# Patient Record
Sex: Female | Born: 1950 | Race: White | Hispanic: No | Marital: Single | Smoking: Never smoker
Health system: Southern US, Community
[De-identification: ages and names within clinical notes are randomized; demographics above are authoritative.]

## PROBLEM LIST (undated history)

## (undated) DIAGNOSIS — J45909 Unspecified asthma, uncomplicated: Secondary | ICD-10-CM

## (undated) DIAGNOSIS — I4891 Unspecified atrial fibrillation: Secondary | ICD-10-CM

## (undated) DIAGNOSIS — J189 Pneumonia, unspecified organism: Secondary | ICD-10-CM

## (undated) DIAGNOSIS — D649 Anemia, unspecified: Secondary | ICD-10-CM

## (undated) DIAGNOSIS — G4733 Obstructive sleep apnea (adult) (pediatric): Secondary | ICD-10-CM

## (undated) DIAGNOSIS — M1990 Unspecified osteoarthritis, unspecified site: Secondary | ICD-10-CM

## (undated) DIAGNOSIS — I10 Essential (primary) hypertension: Secondary | ICD-10-CM

## (undated) HISTORY — PX: TUBAL LIGATION: SHX77

## (undated) HISTORY — PX: OTHER SURGICAL HISTORY: SHX169

## (undated) HISTORY — PX: COLONOSCOPY: SHX174

## (undated) HISTORY — DX: Obstructive sleep apnea (adult) (pediatric): G47.33

---

## 1999-01-19 ENCOUNTER — Other Ambulatory Visit: Admission: RE | Admit: 1999-01-19 | Discharge: 1999-01-19 | Payer: Self-pay | Admitting: Obstetrics and Gynecology

## 2002-04-17 ENCOUNTER — Other Ambulatory Visit: Admission: RE | Admit: 2002-04-17 | Discharge: 2002-04-17 | Payer: Self-pay | Admitting: Obstetrics and Gynecology

## 2009-02-28 ENCOUNTER — Ambulatory Visit (HOSPITAL_COMMUNITY): Admission: RE | Admit: 2009-02-28 | Discharge: 2009-02-28 | Payer: Self-pay | Admitting: Family Medicine

## 2016-02-11 ENCOUNTER — Other Ambulatory Visit: Payer: Self-pay | Admitting: Internal Medicine

## 2016-02-11 ENCOUNTER — Emergency Department (HOSPITAL_COMMUNITY)
Admission: EM | Admit: 2016-02-11 | Discharge: 2016-02-11 | Disposition: A | Discharge status: Home / Self Care | Payer: BLUE CROSS/BLUE SHIELD | Attending: Internal Medicine | Admitting: Internal Medicine

## 2016-02-11 ENCOUNTER — Encounter (HOSPITAL_COMMUNITY): Payer: Self-pay | Admitting: Emergency Medicine

## 2016-02-11 ENCOUNTER — Emergency Department (HOSPITAL_COMMUNITY): Payer: BLUE CROSS/BLUE SHIELD

## 2016-02-11 DIAGNOSIS — I4891 Unspecified atrial fibrillation: Secondary | ICD-10-CM | POA: Diagnosis not present

## 2016-02-11 DIAGNOSIS — J45909 Unspecified asthma, uncomplicated: Secondary | ICD-10-CM | POA: Diagnosis not present

## 2016-02-11 DIAGNOSIS — Z7982 Long term (current) use of aspirin: Secondary | ICD-10-CM | POA: Diagnosis not present

## 2016-02-11 DIAGNOSIS — I480 Paroxysmal atrial fibrillation: Secondary | ICD-10-CM | POA: Diagnosis not present

## 2016-02-11 DIAGNOSIS — I10 Essential (primary) hypertension: Secondary | ICD-10-CM | POA: Insufficient documentation

## 2016-02-11 DIAGNOSIS — Z79899 Other long term (current) drug therapy: Secondary | ICD-10-CM | POA: Diagnosis not present

## 2016-02-11 DIAGNOSIS — R002 Palpitations: Secondary | ICD-10-CM | POA: Diagnosis present

## 2016-02-11 HISTORY — DX: Unspecified asthma, uncomplicated: J45.909

## 2016-02-11 HISTORY — DX: Essential (primary) hypertension: I10

## 2016-02-11 LAB — CBC WITH DIFFERENTIAL/PLATELET
BASOS ABS: 0 10*3/uL (ref 0.0–0.1)
BASOS PCT: 0 %
EOS ABS: 0.1 10*3/uL (ref 0.0–0.7)
EOS PCT: 2 %
HCT: 39.2 % (ref 36.0–46.0)
Hemoglobin: 13 g/dL (ref 12.0–15.0)
LYMPHS PCT: 33 %
Lymphs Abs: 1.7 10*3/uL (ref 0.7–4.0)
MCH: 32.6 pg (ref 26.0–34.0)
MCHC: 33.2 g/dL (ref 30.0–36.0)
MCV: 98.2 fL (ref 78.0–100.0)
MONO ABS: 0.5 10*3/uL (ref 0.1–1.0)
Monocytes Relative: 10 %
Neutro Abs: 2.8 10*3/uL (ref 1.7–7.7)
Neutrophils Relative %: 55 %
PLATELETS: 158 10*3/uL (ref 150–400)
RBC: 3.99 MIL/uL (ref 3.87–5.11)
RDW: 12.6 % (ref 11.5–15.5)
WBC: 5.1 10*3/uL (ref 4.0–10.5)

## 2016-02-11 LAB — BASIC METABOLIC PANEL
ANION GAP: 9 (ref 5–15)
BUN: 16 mg/dL (ref 6–20)
CALCIUM: 8.8 mg/dL — AB (ref 8.9–10.3)
CO2: 23 mmol/L (ref 22–32)
Chloride: 110 mmol/L (ref 101–111)
Creatinine, Ser: 0.8 mg/dL (ref 0.44–1.00)
GFR calc Af Amer: >60 mL/min (ref >60)
GLUCOSE: 130 mg/dL — AB (ref 65–99)
Potassium: 3.8 mmol/L (ref 3.5–5.1)
SODIUM: 142 mmol/L (ref 135–145)

## 2016-02-11 LAB — TSH: TSH: 1.728 u[IU]/mL (ref 0.350–4.500)

## 2016-02-11 LAB — TROPONIN I

## 2016-02-11 MED ORDER — APIXABAN 5 MG PO TABS
5.0000 mg | ORAL_TABLET | Freq: Two times a day (BID) | ORAL | Status: DC
Start: 2016-02-11 — End: 2016-03-05

## 2016-02-11 MED ORDER — HEPARIN (PORCINE) IN NACL 100-0.45 UNIT/ML-% IJ SOLN
900.0000 [IU]/h | INTRAMUSCULAR | Status: DC
  Administered 2016-02-11: 900 [IU]/h via INTRAVENOUS
  Filled 2016-02-11: qty 250

## 2016-02-11 MED ORDER — DILTIAZEM HCL 100 MG IV SOLR: 5.0000 mg/h | INTRAVENOUS | Status: DC

## 2016-02-11 MED ORDER — DILTIAZEM LOAD VIA INFUSION
10.0000 mg | Freq: Once | INTRAVENOUS | Status: DC
  Filled 2016-02-11: qty 10

## 2016-02-11 MED ORDER — DILTIAZEM HCL ER COATED BEADS 120 MG PO CP24: 120.0000 mg | ORAL_CAPSULE | Freq: Every day | ORAL | Status: DC

## 2016-02-11 MED ORDER — HEPARIN BOLUS VIA INFUSION
3500.0000 [IU] | Freq: Once | INTRAVENOUS | Status: AC
  Administered 2016-02-11: 3500 [IU] via INTRAVENOUS
  Filled 2016-02-11: qty 3500

## 2016-02-11 NOTE — ED Provider Notes (Addendum)
CSN: [id number]     Arrival date & time 02/11/16  0424 History   None    Chief Complaint  Patient presents with  . Atrial Fibrillation     (Consider location/radiation/quality/duration/timing/severity/associated sxs/prior Treatment) HPI Comments: Patient brought to the emergency department for evaluation of heart palpitations. Patient reports that she awakened around 3:30 AM to go to the bathroom and noticed that she was having a slight aching in her left shoulder area and then noticed that her heart was pounding. She took an aspirin and it did not improve. She called EMS who found her to be in atrial fibrillation with rapid ventricular response as fast as 180 bpm. She was given IV Cardizem 20 mg IV during transport. Patient reports symptoms are improved at arrival.  Patient is a 65 y.o. female presenting with atrial fibrillation.  Atrial Fibrillation    Past Medical History  Diagnosis Date  . Hypertension   . Asthma    History reviewed. No pertinent past surgical history. No family history on file. Social History  Substance Use Topics  . Smoking status: Never Smoker   . Smokeless tobacco: None  . Alcohol Use: None   OB History    No data available     Review of Systems  Cardiovascular: Positive for palpitations.  All other systems reviewed and are negative.     Allergies  Review of patient's allergies indicates no known allergies.  Home Medications   Prior to Admission medications   Medication Sig Start Date End Date Taking? Authorizing Provider  albuterol (PROVENTIL HFA;VENTOLIN HFA) 108 (90 Base) MCG/ACT inhaler Inhale into the lungs every 6 (six) hours as needed for wheezing or shortness of breath.   Yes Historical Provider, MD  aspirin EC 81 MG tablet Take 81 mg by mouth daily.   Yes Historical Provider, MD  B Complex-C (B-COMPLEX WITH VITAMIN C) tablet Take 1 tablet by mouth daily.   Yes Historical Provider, MD  busPIRone (BUSPAR) 5 MG tablet Take 5 mg by  mouth daily.   Yes Historical Provider, MD  cholecalciferol (VITAMIN D) 1000 units tablet Take 2,000 Units by mouth daily.   Yes Historical Provider, MD  losartan-hydrochlorothiazide (HYZAAR) 100-25 MG tablet Take 1 tablet by mouth daily.   Yes Historical Provider, MD  Multiple Vitamin (MULTIVITAMIN WITH MINERALS) TABS tablet Take 1 tablet by mouth daily.   Yes Historical Provider, MD  verapamil (CALAN) 120 MG tablet Take 240 mg by mouth daily.   Yes Historical Provider, MD   BP 128/88 mmHg  Pulse 65  Temp(Src) 98.6 F (37 C) (Oral)  Resp 19  Ht 5\' 4"  (1.626 m)  Wt 195 lb (88.451 kg)  BMI 33.46 kg/m2  SpO2 98% Physical Exam  Constitutional: She is oriented to person, place, and time. She appears well-developed and well-nourished. No distress.  HENT:  Head: Normocephalic and atraumatic.  Right Ear: Hearing normal.  Left Ear: Hearing normal.  Nose: Nose normal.  Mouth/Throat: Oropharynx is clear and moist and mucous membranes are normal.  Eyes: Conjunctivae and EOM are normal. Pupils are equal, round, and reactive to light.  Neck: Normal range of motion. Neck supple.  Cardiovascular: Regular rhythm, S1 normal and S2 normal.  Exam reveals no gallop and no friction rub.   No murmur heard. Pulmonary/Chest: Effort normal and breath sounds normal. No respiratory distress. She exhibits no tenderness.  Abdominal: Soft. Normal appearance and bowel sounds are normal. There is no hepatosplenomegaly. There is no tenderness. There is no rebound, no  guarding, no tenderness at McBurney's point and negative Murphy's sign. No hernia.  Musculoskeletal: Normal range of motion.  Neurological: She is alert and oriented to person, place, and time. She has normal strength. No cranial nerve deficit or sensory deficit. Coordination normal. GCS eye subscore is 4. GCS verbal subscore is 5. GCS motor subscore is 6.  Skin: Skin is warm, dry and intact. No rash noted. No cyanosis.  Psychiatric: She has a normal  mood and affect. Her speech is normal and behavior is normal. Thought content normal.  Nursing note and vitals reviewed.   ED Course  Procedures (including critical care time) Labs Review Labs Reviewed  BASIC METABOLIC PANEL - Abnormal; Notable for the following:    Glucose, Bld 130 (*)    Calcium 8.8 (*)    All other components within normal limits  CBC WITH DIFFERENTIAL/PLATELET  TROPONIN I  TSH  HEPARIN LEVEL (UNFRACTIONATED)    Imaging Review Dg Chest 2 View  02/11/2016  CLINICAL DATA:  Chest pain, tachycardia and palpitations, onset this morning. EXAM: CHEST  2 VIEW COMPARISON:  None. FINDINGS: There is mild cardiomegaly. Mild linear basilar opacity on the left, likely scarring or minimal atelectasis. No confluent alveolar opacities. No effusions. Normal pulmonary vasculature. Hilar and mediastinal contours are unremarkable. No prior chest imaging studies are available. IMPRESSION: Mild cardiomegaly. Mild linear scarring or atelectasis in the left base. Electronically Signed   By: Ellery Plunk M.D.   On: 02/11/2016 06:37   I have personally reviewed and evaluated these images and lab results as part of my medical decision-making.        EKG Interpretation   Date/Time:  Saturday February 11 2016 06:42:41 EDT Ventricular Rate:  67 PR Interval:  170 QRS Duration: 89 QT Interval:  408 QTC Calculation: 431 R Axis:   -25 Text Interpretation:  Sinus rhythm Borderline left axis deviation Low  voltage, extremity leads Otherwise within normal limits Confirmed by  Meyer Dockery  MD, Royelle Hinchman ([id number]) on 02/11/2016 8:04:42 AM      MDM   Final diagnoses:  Atrial fibrillation with RVR (HCC)    Patient awakened with discomfort in her chest and heart palpitations around 2:00 this morning. EMS report finding her in atrial fibrillation with rapid ventricular response as high as 180 bpm. She was administered a Cardizem bolus and converted to sinus rhythm. At arrival to the ER she  was not on a monitor. She did sound to be regular upon initial evaluation, but after being placed in a room and on a continuous monitor, she was found to be back in atrial fibrillation with rapid ventricular response. She is not experiencing any chest pain or shortness of breath with this. Arrangements were being made to start the patient on heparin and IV Cardizem, but she converted to sinus rhythm once again. Patient to be discussed with cardiology. Signed out to oncoming ER physician to disposition patient.   Gilda Crease, MD 02/11/16 6045  Gilda Crease, MD 02/12/16 [id number]

## 2016-02-11 NOTE — ED Provider Notes (Signed)
Cardiology consult obtained. Cardiologist recommended diltiazem 120 mg LA daily along with Eliquis 5 mg twice a day. Discontinue verapamil. Continue to take losartan/HCTZ.   Patient will follow-up with cardiologist. Patient was discharged in normal sinus rhythm.  [name] HutchingBrian Laurier Jasperson, MD 02/11/16 1023

## 2016-02-11 NOTE — Consult Note (Signed)
CONSULTATION NOTE  Reason for Consult: Atrial fibrillation  Requesting Physician: Dr. Betsey Holiday  Cardiologist: None (NEW)  HPI: This is a 65 y.o. female with a past medical history significant for hypertension and asthma. She presented with onset of aching in her left shoulder and heart racing. She took an aspirin without any improvement and called EMS. On arrival she was found to be in A. fib with rapid ventricular response at 180 bpm. She was given IV diltiazem and spontaneously converted to sinus rhythm however had a short recurrence of A. fib in the ER and then again converted back to sinus rhythm. This patients CHA2DS2-VASc Score and unadjusted Ischemic Stroke Rate (% per year) is equal to 2.2 % stroke rate/year from a score of 2  Above score calculated as 1 point each if present [CHF, HTN, DM, Vascular=MI/PAD/Aortic Plaque, Age if 65-74, or Female] Above score calculated as 2 points each if present [Age > 75, or Stroke/TIA/TE]  Cardiology is asked to recommend treatment and follow-up options.  PMHx:  Past Medical History  Diagnosis Date  . Hypertension   . Asthma    History reviewed. No pertinent past surgical history.  FAMHx: Family History  Problem Relation Age of Onset  . Atrial fibrillation Mother   . Hypertension Mother   . Coronary artery disease Father     SOCHx:  reports that she has never smoked. She does not have any smokeless tobacco history on file. Her alcohol and drug histories are not on file.  ALLERGIES: No Known Allergies  ROS: Pertinent items noted in HPI and remainder of comprehensive ROS otherwise negative.  HOME MEDICATIONS:   Medication List    ASK your doctor about these medications        albuterol 108 (90 Base) MCG/ACT inhaler  Commonly known as:  PROVENTIL HFA;VENTOLIN HFA  Inhale into the lungs every 6 (six) hours as needed for wheezing or shortness of breath.     aspirin EC 81 MG tablet  Take 81 mg by mouth daily.     B-complex with vitamin C tablet  Take 1 tablet by mouth daily.     busPIRone 5 MG tablet  Commonly known as:  BUSPAR  Take 5 mg by mouth daily.     cholecalciferol 1000 units tablet  Commonly known as:  VITAMIN D  Take 2,000 Units by mouth daily.     losartan-hydrochlorothiazide 100-25 MG tablet  Commonly known as:  HYZAAR  Take 1 tablet by mouth daily.     multivitamin with minerals Tabs tablet  Take 1 tablet by mouth daily.     verapamil 120 MG tablet  Commonly known as:  CALAN  Take 240 mg by mouth daily.        HOSPITAL MEDICATIONS: I have reviewed the patient's current medications.  VITALS: Blood pressure 128/88, pulse 65, temperature 98.6 F (37 C), temperature source Oral, resp. rate 19, height 5' 4" (1.626 m), weight 195 lb (88.451 kg), SpO2 98 %.  PHYSICAL EXAM: General appearance: alert and no distress Neck: no carotid bruit and no JVD Lungs: clear to auscultation bilaterally Heart: regular rate and rhythm, S1, S2 normal, no murmur, click, rub or gallop Abdomen: soft, non-tender; bowel sounds normal; no masses,  no organomegaly Extremities: extremities normal, atraumatic, no cyanosis or edema Pulses: 2+ and symmetric Skin: Skin color, texture, turgor normal. No rashes or lesions Neurologic: Grossly normal Psych: Pleasant  LABS: Results for orders placed or performed during the hospital encounter of 02/11/16 (from  the past 48 hour(s))  CBC with Differential/Platelet     Status: None   Collection Time: 02/11/16  5:17 AM  Result Value Ref Range   WBC 5.1 4.0 - 10.5 K/uL   RBC 3.99 3.87 - 5.11 MIL/uL   Hemoglobin 13.0 12.0 - 15.0 g/dL   HCT 39.2 36.0 - 46.0 %   MCV 98.2 78.0 - 100.0 fL   MCH 32.6 26.0 - 34.0 pg   MCHC 33.2 30.0 - 36.0 g/dL   RDW 12.6 11.5 - 15.5 %   Platelets 158 150 - 400 K/uL   Neutrophils Relative % 55 %   Neutro Abs 2.8 1.7 - 7.7 K/uL   Lymphocytes Relative 33 %   Lymphs Abs 1.7 0.7 - 4.0 K/uL   Monocytes Relative 10 %    Monocytes Absolute 0.5 0.1 - 1.0 K/uL   Eosinophils Relative 2 %   Eosinophils Absolute 0.1 0.0 - 0.7 K/uL   Basophils Relative 0 %   Basophils Absolute 0.0 0.0 - 0.1 K/uL  Basic metabolic panel     Status: Abnormal   Collection Time: 02/11/16  5:17 AM  Result Value Ref Range   Sodium 142 135 - 145 mmol/L   Potassium 3.8 3.5 - 5.1 mmol/L   Chloride 110 101 - 111 mmol/L   CO2 23 22 - 32 mmol/L   Glucose, Bld 130 (H) 65 - 99 mg/dL   BUN 16 6 - 20 mg/dL   Creatinine, Ser 0.80 0.44 - 1.00 mg/dL   Calcium 8.8 (L) 8.9 - 10.3 mg/dL   GFR calc non Af Amer >60 >60 mL/min   GFR calc Af Amer >60 >60 mL/min    Comment: (NOTE) The eGFR has been calculated using the CKD EPI equation. This calculation has not been validated in all clinical situations. eGFR's persistently <60 mL/min signify possible Chronic Kidney Disease.    Anion gap 9 5 - 15  Troponin I     Status: None   Collection Time: 02/11/16  5:17 AM  Result Value Ref Range   Troponin I <0.03 <0.031 ng/mL    Comment:        NO INDICATION OF MYOCARDIAL INJURY.   TSH     Status: None   Collection Time: 02/11/16  5:17 AM  Result Value Ref Range   TSH 1.728 0.350 - 4.500 uIU/mL    IMAGING: Dg Chest 2 View  02/11/2016  CLINICAL DATA:  Chest pain, tachycardia and palpitations, onset this morning. EXAM: CHEST  2 VIEW COMPARISON:  None. FINDINGS: There is mild cardiomegaly. Mild linear basilar opacity on the left, likely scarring or minimal atelectasis. No confluent alveolar opacities. No effusions. Normal pulmonary vasculature. Hilar and mediastinal contours are unremarkable. No prior chest imaging studies are available. IMPRESSION: Mild cardiomegaly. Mild linear scarring or atelectasis in the left base. Electronically Signed   By: Andreas Newport M.D.   On: 02/11/2016 06:37    HOSPITAL DIAGNOSES: Principal Problem:   PAF (paroxysmal atrial fibrillation) (HCC) Active Problems:   Essential hypertension    Asthma   IMPRESSION: 1. Paroxysmal atrial fibrillation, symptomatic 2. Essential hypertension  RECOMMENDATION: 1. [name] [name] had new onset paroxysmal atrial fibrillation with rapid ventricular response. This was improved with diltiazem. She converted spontaneously back to sinus rhythm. She's currently asymptomatic. She does have a history of hypertension. Her CHADSVASC score is 2. I'm recommending Eliquis 5 mg twice a day. We will discontinue verapamil and start Cardizem LA 120 mg daily. She should continue losartan for  hypertension. Aspirin is not necessary. Our office will contact her for follow-up appointment and we will schedule outpatient stress testing and echocardiogram prior to that visit. She can be safely discharged home today.  Thanks for the consultation.  Time Spent Directly with Patient: 45 minutes  Pixie Casino, MD, [facility] Attending Cardiologist [place] 02/11/2016, 9:47 AM

## 2016-02-11 NOTE — ED Notes (Signed)
[facility] EMS reports: patient awakened approx 0300 with palpitations and rapid heartrate. Called EMS. Patient in AF with RVR. No prior history. 20 gauge placed in R hand. Cardizem 20 mg administered. Converted to NSR at 0340. Denies pain. BP 142/90, Pulse 94.

## 2016-02-11 NOTE — Discharge Instructions (Signed)
Cardiologist recommends starting 2 new medications. Prescriptions given. Discontinue verapamil. Continue to take your Losartan/HCTZ.   Follow-up with cardiologist next week. They will call you Monday for an appointment.

## 2016-02-11 NOTE — Progress Notes (Addendum)
ANTICOAGULATION CONSULT NOTE - Initial Consult  Pharmacy Consult for Heparin  Indication: atrial fibrillation  No Known Allergies  Patient Measurements: Height: 5\' 4"  (162.6 cm) Weight: 195 lb (88.451 kg) IBW/kg (Calculated) : 54.7  Vital Signs: Temp: 98.6 F (37 C) (03/18 0532) Temp Source: Oral (03/18 0532) BP: 120/97 mmHg (03/18 0532) Pulse Rate: 141 (03/18 0532)  Labs: In process   Medical History: Past Medical History  Diagnosis Date  . Hypertension   . Asthma    Assessment: 65 y/o F with new onset afib, no anti-coagulation PTA, labs in process, starting heparin per pharmacy  Goal of Therapy:  Heparin level 0.3-0.7 units/ml Monitor platelets by anticoagulation protocol: Yes   Plan:  -Heparin 3500 units BOLUS -Start heparin drip at 900 units/hr -1400 HL -Daily CBC/HL -Monitor for bleeding -F/U in process labs ASAP  Abran DukeLedford, Jaber Dunlow 02/11/2016,5:52 AM  ================== Addendum 6:07 AM CBC good Wilmer FloorJLedford, PharmD

## 2016-02-21 ENCOUNTER — Telehealth (HOSPITAL_COMMUNITY): Payer: Self-pay

## 2016-02-21 NOTE — Telephone Encounter (Signed)
Encounter complete. 

## 2016-02-22 ENCOUNTER — Ambulatory Visit (HOSPITAL_BASED_OUTPATIENT_CLINIC_OR_DEPARTMENT_OTHER)
Admission: RE | Admit: 2016-02-22 | Discharge: 2016-02-22 | Disposition: A | Discharge status: Home / Self Care | Payer: BLUE CROSS/BLUE SHIELD | Source: Ambulatory Visit | Attending: Cardiovascular Disease | Admitting: Cardiovascular Disease

## 2016-02-22 ENCOUNTER — Ambulatory Visit (HOSPITAL_COMMUNITY)
Admission: RE | Admit: 2016-02-22 | Discharge: 2016-02-22 | Disposition: A | Discharge status: Home / Self Care | Payer: BLUE CROSS/BLUE SHIELD | Source: Ambulatory Visit | Attending: Cardiovascular Disease | Admitting: Cardiovascular Disease

## 2016-02-22 DIAGNOSIS — I351 Nonrheumatic aortic (valve) insufficiency: Secondary | ICD-10-CM | POA: Diagnosis not present

## 2016-02-22 DIAGNOSIS — I119 Hypertensive heart disease without heart failure: Secondary | ICD-10-CM | POA: Diagnosis not present

## 2016-02-22 DIAGNOSIS — I4891 Unspecified atrial fibrillation: Secondary | ICD-10-CM | POA: Diagnosis present

## 2016-02-22 DIAGNOSIS — I480 Paroxysmal atrial fibrillation: Secondary | ICD-10-CM

## 2016-02-22 DIAGNOSIS — Z8249 Family history of ischemic heart disease and other diseases of the circulatory system: Secondary | ICD-10-CM | POA: Diagnosis not present

## 2016-02-22 DIAGNOSIS — I059 Rheumatic mitral valve disease, unspecified: Secondary | ICD-10-CM | POA: Insufficient documentation

## 2016-02-22 LAB — EXERCISE TOLERANCE TEST
CHL RATE OF PERCEIVED EXERTION: 17
CSEPED: 4 min
CSEPEDS: 26 s
CSEPEW: 6.3 METS
MPHR: 157 {beats}/min
Peak HR: 137 {beats}/min
Percent HR: 87 %
Rest HR: 64 {beats}/min

## 2016-03-05 ENCOUNTER — Encounter: Payer: Self-pay | Admitting: Internal Medicine

## 2016-03-05 ENCOUNTER — Ambulatory Visit (INDEPENDENT_AMBULATORY_CARE_PROVIDER_SITE_OTHER): Payer: BLUE CROSS/BLUE SHIELD | Admitting: Internal Medicine

## 2016-03-05 VITALS — BP 142/64 | HR 64 | Ht 64.0 in | Wt 197.0 lb

## 2016-03-05 DIAGNOSIS — I480 Paroxysmal atrial fibrillation: Secondary | ICD-10-CM

## 2016-03-05 DIAGNOSIS — Z7901 Long term (current) use of anticoagulants: Secondary | ICD-10-CM

## 2016-03-05 DIAGNOSIS — I10 Essential (primary) hypertension: Secondary | ICD-10-CM

## 2016-03-05 MED ORDER — APIXABAN 5 MG PO TABS: 5.0000 mg | ORAL_TABLET | Freq: Two times a day (BID) | ORAL | Status: DC

## 2016-03-05 MED ORDER — DILTIAZEM HCL ER COATED BEADS 120 MG PO CP24: 120.0000 mg | ORAL_CAPSULE | Freq: Every day | ORAL | Status: DC

## 2016-03-05 NOTE — Patient Instructions (Signed)
Your physician wants you to follow-up in: 6 months with Dr. Hilty. You will receive a reminder letter in the mail two months in advance. If you don't receive a letter, please call our office to schedule the follow-up appointment.    

## 2016-03-05 NOTE — Progress Notes (Signed)
OFFICE NOTE  Chief Complaint:  Hospital follow-up, follow-up studies, no complaints  Primary Care Physician: [name] CourierGerald K Hill, MD  HPI:  [name] [name] is a 65 y.o. female with a past medical history significant for hypertension and asthma. She presented with onset of aching in her left shoulder and heart racing. She took an aspirin without any improvement and called EMS. On arrival she was found to be in A. fib with rapid ventricular response at 180 bpm. She was given IV diltiazem and spontaneously converted to sinus rhythm however had a short recurrence of A. fib in the ER and then again converted back to sinus rhythm. This patients CHA2DS2-VASc Score and unadjusted Ischemic Stroke Rate (% per year) is equal to 2.2 % stroke rate/year from a score of 2. Above score calculated as 1 point each if present [CHF, HTN, DM, Vascular=MI/PAD/Aortic Plaque, Age if 65-74, or Female] Above score calculated as 2 points each if present [Age > 75, or Stroke/TIA/TE]  [name] [name] returns today for follow-up. She underwent an exercise treadmill stress test which was negative for ischemia. Although the report indicated she did not reach target, I personally reviewed it and indicated her heart rate was greater than 85% max predicted heart rate. Therefore this was an adequate study and low risk. Her echocardiogram demonstrates normal LV function with an EF of 60-65%, there is trivial aortic insufficiency and it was read as grade 2 diastolic dysfunction. Is not clear whether she may eventually been in A. fib or not had recovery of atrial mechanical activity at the time therefore I question the degree of diastolic dysfunction. She denies any chest pain, or shortness of breath with exertion. Blood pressure is fairly well-controlled today. She is tolerating diltiazem which was a switch from verapamil which she was previously taking. She is also on Eliquis 5 mg twice a day without any bleeding problems. Heart rate is in the 60s today  and regular.  PMHx:  Past Medical History  Diagnosis Date  . Hypertension   . Asthma     History reviewed. No pertinent past surgical history.  FAMHx:  Family History  Problem Relation Age of Onset  . Atrial fibrillation Mother   . Hypertension Mother   . Coronary artery disease Father     SOCHx:   reports that she has never smoked. She does not have any smokeless tobacco history on file. She reports that she does not drink alcohol or use illicit drugs.  ALLERGIES:  No Known Allergies  ROS: Pertinent items noted in HPI and remainder of comprehensive ROS otherwise negative.  HOME MEDS: Current Outpatient Prescriptions  Medication Sig Dispense Refill  . albuterol (PROVENTIL HFA;VENTOLIN HFA) 108 (90 Base) MCG/ACT inhaler Inhale into the lungs every 6 (six) hours as needed for wheezing or shortness of breath.    Marland Kitchen. apixaban (ELIQUIS) 5 MG TABS tablet Take 1 tablet (5 mg total) by mouth 2 (two) times daily. 60 tablet 5  . B Complex-C (B-COMPLEX WITH VITAMIN C) tablet Take 1 tablet by mouth daily.    . busPIRone (BUSPAR) 5 MG tablet Take 5 mg by mouth daily.    . cholecalciferol (VITAMIN D) 1000 units tablet Take 2,000 Units by mouth daily.    Marland Kitchen. diltiazem (CARDIZEM CD) 120 MG 24 hr capsule Take 1 capsule (120 mg total) by mouth daily. 30 capsule 5  . losartan-hydrochlorothiazide (HYZAAR) 100-25 MG tablet Take 1 tablet by mouth daily.    . Multiple Vitamin (MULTIVITAMIN WITH MINERALS) TABS  tablet Take 1 tablet by mouth daily.     No current facility-administered medications for this visit.    LABS/IMAGING: No results found for this or any previous visit (from the past 48 hour(s)). No results found.  WEIGHTS: Wt Readings from Last 3 Encounters:  03/05/16 197 lb (89.359 kg)  02/11/16 195 lb (88.451 kg)    VITALS: BP 142/64 mmHg  Pulse 64  Ht  (1.626 m)  Wt 197 lb (89.359 kg)  BMI 33.80 kg/m2  EXAM: General appearance: alert and no distress Neck: no carotid  bruit and no JVD Lungs: clear to auscultation bilaterally Heart: regular rate and rhythm, S1, S2 normal, no murmur, click, rub or gallop Abdomen: soft, non-tender; bowel sounds normal; no masses,  no organomegaly Extremities: extremities normal, atraumatic, no cyanosis or edema Pulses: 2+ and symmetric Skin: Skin color, texture, turgor normal. No rashes or lesions Neurologic: Grossly normal Psych: Pleasant  EKG: Deferred  ASSESSMENT: 1. PAF-CHADSVASC score of 2 on Eliquis 2. Hypertension-controlled 3. Long-term use of anticoagulants  PLAN: 1.   [name] had a low risk exercise treadmill stress test and normal LV function on echo with questionable grade 2 diastolic dysfunction and trivial AI. Symptomatically she is doing very well. She denies any chest pain or shortness of breath with exertion. She says she walks regularly and will start to increase her exercise again. She seems to be tolerating Eliquis without bleeding problems. The switch of her diltiazem for verapamil has maintained blood pressure. Rate control is appropriate. We'll plan to continue her current medicines and see her back in 6 months.  [name] Nose, MD, [facility] Attending Cardiologist [facility]  [name] [name] 03/05/2016, 10:11 AM

## 2016-08-05 ENCOUNTER — Encounter (HOSPITAL_COMMUNITY): Payer: Self-pay

## 2016-08-05 ENCOUNTER — Emergency Department (HOSPITAL_COMMUNITY): Payer: BLUE CROSS/BLUE SHIELD

## 2016-08-05 ENCOUNTER — Emergency Department (HOSPITAL_COMMUNITY)
Admission: EM | Admit: 2016-08-05 | Discharge: 2016-08-05 | Disposition: A | Discharge status: Home / Self Care | Payer: BLUE CROSS/BLUE SHIELD | Attending: Emergency Medicine | Admitting: Emergency Medicine

## 2016-08-05 DIAGNOSIS — Y999 Unspecified external cause status: Secondary | ICD-10-CM | POA: Diagnosis not present

## 2016-08-05 DIAGNOSIS — S82892A Other fracture of left lower leg, initial encounter for closed fracture: Secondary | ICD-10-CM

## 2016-08-05 DIAGNOSIS — Y929 Unspecified place or not applicable: Secondary | ICD-10-CM | POA: Insufficient documentation

## 2016-08-05 DIAGNOSIS — S82852A Displaced trimalleolar fracture of left lower leg, initial encounter for closed fracture: Secondary | ICD-10-CM | POA: Insufficient documentation

## 2016-08-05 DIAGNOSIS — X501XXA Overexertion from prolonged static or awkward postures, initial encounter: Secondary | ICD-10-CM | POA: Insufficient documentation

## 2016-08-05 DIAGNOSIS — Y9301 Activity, walking, marching and hiking: Secondary | ICD-10-CM | POA: Diagnosis not present

## 2016-08-05 DIAGNOSIS — I10 Essential (primary) hypertension: Secondary | ICD-10-CM | POA: Diagnosis not present

## 2016-08-05 DIAGNOSIS — Z79899 Other long term (current) drug therapy: Secondary | ICD-10-CM | POA: Insufficient documentation

## 2016-08-05 DIAGNOSIS — S99912A Unspecified injury of left ankle, initial encounter: Secondary | ICD-10-CM | POA: Diagnosis present

## 2016-08-05 DIAGNOSIS — J45909 Unspecified asthma, uncomplicated: Secondary | ICD-10-CM | POA: Insufficient documentation

## 2016-08-05 HISTORY — DX: Unspecified atrial fibrillation: I48.91

## 2016-08-05 MED ORDER — OXYCODONE-ACETAMINOPHEN 5-325 MG PO TABS: 2.0000 | ORAL_TABLET | ORAL | 0 refills | Status: DC | PRN

## 2016-08-05 MED ORDER — HYDROCODONE-ACETAMINOPHEN 5-325 MG PO TABS
1.0000 | ORAL_TABLET | Freq: Once | ORAL | Status: AC
  Administered 2016-08-05: 1 via ORAL
  Filled 2016-08-05: qty 1

## 2016-08-05 MED ORDER — OXYCODONE-ACETAMINOPHEN 5-325 MG PO TABS: 1.0000 | ORAL_TABLET | ORAL | 0 refills | Status: DC | PRN

## 2016-08-05 MED ORDER — HYDROCODONE-ACETAMINOPHEN 5-325 MG PO TABS: 1.0000 | ORAL_TABLET | ORAL | 0 refills | Status: DC | PRN

## 2016-08-05 NOTE — ED Triage Notes (Signed)
Patient brought in by [facility] . PT complains of left ankle pain. States she was walking and twisted ankle. Swelling noted to left ankle with skin abrasion to left knee. No obvious deformity noted. C/m/s intact.

## 2016-08-05 NOTE — Discharge Instructions (Signed)
Elevate and use ice as much as possible for the next several days to help with swelling and pain.  Do not weight bear - use your crutches at all times!  You may take the hydrocodone prescribed for pain relief.  This will make you drowsy - do not drive within 4 hours of taking this medication.

## 2016-08-05 NOTE — ED Notes (Addendum)
Percocet 6 pack to go given to patient at discharge with verbal instructions. Witnessed by Sharia ReeveLeigh Wallace RN.

## 2016-08-05 NOTE — ED Provider Notes (Signed)
[facility]Y DEPT Provider Note   CSN: [id number] Arrival date & time: 08/05/16  1649  By signing my name below, I, Christy Sartorius, attest that this documentation has been prepared under the direction and in the presence of  Burgess Amor, PA-C. Electronically Signed: Christy Sartorius, ED Scribe. 08/05/16. 6:06 PM.  History   Chief Complaint Chief Complaint  Patient presents with  . Ankle Injury   The history is provided by the patient and medical records. No language interpreter was used.   HPI Comments:  [name] [name] is a 65 y.o. female currently taking Eliquis brought in by ambulance, who presents to the Emergency Department s/p fall just PTA complaining of pain and swelling in her left ankle.  She reports she was walking when her right foot got caught in a hole and she fell, twisting the ankle.  No alleviating factors noted.  She denies numbness, weakness and pain in her hip or knee.  She also denies known medication allergies.  She drives a forklift for a living.  Her PCP is Dr. Mirna Mires.    Past Medical History:  Diagnosis Date  . A-fib (HCC)   . Asthma   . Hypertension     Patient Active Problem List   Diagnosis Date Noted  . Long term current use of anticoagulant 03/05/2016  . PAF (paroxysmal atrial fibrillation) (HCC) 02/11/2016  . Essential hypertension 02/11/2016  . Asthma 02/11/2016    Past Surgical History:  Procedure Laterality Date  . c section      OB History    No data available       Home Medications    Prior to Admission medications   Medication Sig Start Date End Date Taking? Authorizing Provider  apixaban (ELIQUIS) 5 MG TABS tablet Take 1 tablet (5 mg total) by mouth 2 (two) times daily. 03/05/16  Yes Chrystie Nose, MD  B Complex-C (B-COMPLEX WITH VITAMIN C) tablet Take 1 tablet by mouth daily.   Yes Historical Provider, MD  busPIRone (BUSPAR) 5 MG tablet Take 5 mg by mouth 2 (two) times daily.    Yes Historical Provider, MD    cholecalciferol (VITAMIN D) 1000 units tablet Take 2,000 Units by mouth daily.   Yes Historical Provider, MD  diltiazem (CARDIZEM CD) 120 MG 24 hr capsule Take 1 capsule (120 mg total) by mouth daily. 03/05/16  Yes Chrystie Nose, MD  losartan-hydrochlorothiazide (HYZAAR) 100-25 MG tablet Take 1 tablet by mouth daily.   Yes Historical Provider, MD  Multiple Vitamin (MULTIVITAMIN WITH MINERALS) TABS tablet Take 1 tablet by mouth daily.   Yes Historical Provider, MD  albuterol (PROVENTIL HFA;VENTOLIN HFA) 108 (90 Base) MCG/ACT inhaler Inhale into the lungs every 6 (six) hours as needed for wheezing or shortness of breath.    Historical Provider, MD  oxyCODONE-acetaminophen (PERCOCET/ROXICET) 5-325 MG tablet Take 1 tablet by mouth every 4 (four) hours as needed. 08/05/16   Burgess Amor, PA-C  oxyCODONE-acetaminophen (PERCOCET/ROXICET) 5-325 MG tablet Take 2 tablets by mouth every 4 (four) hours as needed for severe pain. 08/05/16   Burgess Amor, PA-C    Family History Family History  Problem Relation Age of Onset  . Atrial fibrillation Mother   . Hypertension Mother   . Coronary artery disease Father     Social History Social History  Substance Use Topics  . Smoking status: Never Smoker  . Smokeless tobacco: Never Used  . Alcohol use No     Allergies   Review of patient's allergies  indicates no known allergies.   Review of Systems Review of Systems  Musculoskeletal: Positive for arthralgias, joint swelling and myalgias.  Neurological: Negative for weakness and numbness.     Physical Exam Updated Vital Signs BP 127/81 (BP Location: Right Arm)   Pulse 61   Temp 98.2 F (36.8 C) (Oral)   Resp 15   Ht 5\' 4"  (1.626 m)   Wt 88.5 kg   SpO2 95%   BMI 33.47 kg/m   Physical Exam  Constitutional: She is oriented to person, place, and time. She appears well-developed and well-nourished. No distress.  HENT:  Head: Normocephalic and atraumatic.  Eyes: Conjunctivae are normal.   Cardiovascular: Normal rate.   Pulmonary/Chest: Effort normal.  Musculoskeletal:  Tender to palpation of bilateral left ankle malleoli.  Minimal edema and no bruising.  Distal sensation intact with full flexion and extention of toes without pain.  DP 2+ Proximal fibula non-tender, modest abrasion on left anterior knee.  Hip non-tender.   Neurological: She is alert and oriented to person, place, and time.  Skin: Skin is warm and dry.  Psychiatric: She has a normal mood and affect.  Nursing note and vitals reviewed.    ED Treatments / Results   DIAGNOSTIC STUDIES:  Oxygen Saturation is 96% on RA, NML by my interpretation.    COORDINATION OF CARE:  6:06 PM Discussed treatment plan with pt at bedside and pt agreed to plan.  Labs (all labs ordered are listed, but only abnormal results are displayed) Labs Reviewed - No data to display  EKG  EKG Interpretation None       Radiology Dg Ankle Complete Left  Result Date: 08/05/2016 CLINICAL DATA:  Left ankle pain after fall EXAM: LEFT ANKLE COMPLETE - 3+ VIEW COMPARISON:  None. FINDINGS: There is a transverse medial malleolus fracture, nondisplaced. There is an oblique left distal fibular metaphysis fracture with 4 mm lateral displacement of the distal fracture fragment. There is a nondisplaced posterior malleolus fracture in the left distal tibia. No left ankle subluxation. No suspicious focal osseous lesion. Diffuse left ankle soft tissue swelling. Small Achilles and plantar left calcaneal spurs. IMPRESSION: Trimalleolar left ankle fractures as described.  No subluxation. Electronically Signed   By: Delbert Phenix M.D.   On: 08/05/2016 17:33   Dg Knee Complete 4 Views Left  Result Date: 08/05/2016 CLINICAL DATA:  Fall.  Left knee pain. EXAM: LEFT KNEE - COMPLETE 4+ VIEW COMPARISON:  None. FINDINGS: No evidence of fracture, dislocation, or joint effusion. No evidence of arthropathy or other focal bone abnormality. Soft tissues are  unremarkable. IMPRESSION: Negative. Electronically Signed   By: Delbert Phenix M.D.   On: 08/05/2016 17:31    Procedures Procedures (including critical care time)  Medications Ordered in ED Medications  HYDROcodone-acetaminophen (NORCO/VICODIN) 5-325 MG per tablet 1 tablet (1 tablet Oral Given 08/05/16 1826)     Initial Impression / Assessment and Plan / ED Course  I have reviewed the triage vital signs and the nursing notes.  Pertinent labs & imaging results that were available during my care of the patient were reviewed by me and considered in my medical decision making (see chart for details).  Clinical Course  Value Comment By Time  DG Knee Complete 4 Views Left (Reviewed) Christy Sartorius 09/10 1754    Discussed with Dr. Magnus Ivan who will see pt in office - pt to call for appt within the next week.  Posterior splint/stirrup.  Crutches.  RICE, oxycodone prescribed.   Patient  will be discharged home & is agreeable with above plan. Returns precautions discussed. Pt appears safe for discharge.  Final Clinical Impressions(s) / ED Diagnoses   Final diagnoses:  Closed left ankle fracture, initial encounter    New Prescriptions Discharge Medication List as of 08/05/2016  7:54 PM    START taking these medications   Details  !! oxyCODONE-acetaminophen (PERCOCET/ROXICET) 5-325 MG tablet Take 1 tablet by mouth every 4 (four) hours as needed., Starting Sun 08/05/2016, Print    !! oxyCODONE-acetaminophen (PERCOCET/ROXICET) 5-325 MG tablet Take 2 tablets by mouth every 4 (four) hours as needed for severe pain., Starting Sun 08/05/2016, Print     !! - Potential duplicate medications found. Please discuss with provider.     I personally performed the services described in this documentation, which was scribed in my presence. The recorded information has been reviewed and is accurate.     Burgess AmorJulie Geraldene Eisel, PA-C 08/07/16 1415    Vanetta MuldersScott Zackowski, MD 08/08/16 1651

## 2016-08-06 MED FILL — Oxycodone w/ Acetaminophen Tab 5-325 MG: ORAL | Qty: 6 | Status: AC

## 2016-08-09 ENCOUNTER — Other Ambulatory Visit: Payer: Self-pay | Admitting: Physician Assistant

## 2016-08-15 ENCOUNTER — Encounter (HOSPITAL_COMMUNITY): Payer: Self-pay | Admitting: *Deleted

## 2016-08-15 MED ORDER — CEFAZOLIN SODIUM-DEXTROSE 2-4 GM/100ML-% IV SOLN
2.0000 g | INTRAVENOUS | Status: AC
  Administered 2016-08-16: 2 g via INTRAVENOUS
  Filled 2016-08-15: qty 100

## 2016-08-15 NOTE — Progress Notes (Signed)
New onset of Atrial fib in April, 2017. Pt states that she has not been back A-fib since. Pt states she stopped her Eliquis on 08/12/16. She states Dr. Magnus IvanBlackman instructed her to stop Eliquis.

## 2016-08-15 NOTE — Progress Notes (Signed)
Anesthesia chart review: SAME DAY WORK-UP.  Patient is a 65 year old female scheduled for ORIF left bimalleolar ankle fracture on 08/16/2016 by Dr. Doneen Poissonhristopher Blackman.  History includes non-smoker, HTN, asthma, afib 01/2016, anemia. Last BMI (33) consistent with obesity.   PCP is listed as Dr. Mirna MiresGerald Hill. Cardiologist is Dr. Zoila ShutterKenneth Hilty. She is on Eliquis for afib (CHADSVASC of 2). Last visit 03/05/16 with six month follow-up planned.  Meds include albuterol, Eliquis, BuSpar, diltiazem, losartan-hydrochlorothiazide, Percocet. She reported being instructed to hold Eliquis for 3 days prior to surgery.   02/11/16 0531 EKG: afib with RVR at 146 bpm,l ow voltage, prolonged QT. 02/11/16 0642 EKG (after IV diltiazem): SR, borderline LAD, low voltage, extremity leads.   02/22/16 Echo: Study Conclusions - Left ventricle: The cavity size was normal. Wall thickness was   normal. Systolic function was normal. The estimated ejection   fraction was in the range of 60% to 65%. Wall motion was normal;   there were no regional wall motion abnormalities. Features are   consistent with a pseudonormal left ventricular filling pattern,   with concomitant abnormal relaxation and increased filling   pressure (grade 2 diastolic dysfunction). - Aortic valve: There was trivial regurgitation. Valve area (Vmax):   2.12 cm^2. - Mitral valve: Mildly to moderately calcified annulus. - Left atrium: The atrium was mildly dilated. Impressions: - The echo is technically difficult.  02/22/16 ETT:   There was no ST segment deviation noted during stress. Negative but non diagnostic exercise treadmill due to achievement of only 87% of maximum predicted HR. ([name] reviewed and wrote, "Although the report indicated she did not reach target, I personally reviewed it and indicated her heart rate was greater than 85% max predicted heart rate. Therefore this was an adequate study and low risk.")  02/11/16 CXR:  IMPRESSION: Mild cardiomegaly. Mild linear scarring or atelectasis in the left base.  Labs to be drawn on arrival.   If no acute changes and labs acceptable then I would anticipate that she could proceed as planned.  Velna Ochsllison Lakendria Nicastro, PA-C [facility]/Anesthesiology Phone [phone] 08/15/2016 11:56 AM

## 2016-08-15 NOTE — Anesthesia Preprocedure Evaluation (Addendum)
Anesthesia Evaluation  Patient identified by MRN, date of birth, ID band Patient awake    Reviewed: Allergy & Precautions, NPO status , Patient's Chart, lab work & pertinent test results  History of Anesthesia Complications Negative for: history of anesthetic complications  Airway Mallampati: III   Neck ROM: Full  Mouth opening: Limited Mouth Opening  Dental  (+) Teeth Intact, Dental Advisory Given   Pulmonary neg shortness of breath, asthma (last used 3 months ago) , neg sleep apnea, neg COPD, neg recent URI,    breath sounds clear to auscultation       Cardiovascular hypertension, (-) angina+CHF (grade II diastolic dysfunction)  (-) Past MI, (-) Cardiac Stents and (-) Orthopnea + dysrhythmias Atrial Fibrillation  Rhythm:Regular Rate:Normal  02/11/16 0531 EKG: afib with RVR at 146 bpm,l ow voltage, prolonged QT. 02/11/16 0642 EKG (after IV diltiazem): SR, borderline LAD, low voltage, extremity leads.   02/22/16 Echo: Study Conclusions - Left ventricle: The cavity size was normal. Wall thickness was normal. Systolic function was normal. The estimated ejection fraction was in the range of 60% to 65%. Wall motion was normal; there were no regional wall motion abnormalities. Features are consistent with a pseudonormal left ventricular filling pattern, with concomitant abnormal relaxation and increased filling pressure (grade 2 diastolic dysfunction). - Aortic valve: There was trivial regurgitation. Valve area (Vmax): 2.12 cm^2. - Mitral valve: Mildly to moderately calcified annulus. - Left atrium: The atrium was mildly dilated. Impressions: - The echo is technically difficult.  02/22/16 ETT:   There was no ST segment deviation noted during stress. Negative but non diagnostic exercise treadmill due to achievement of only 87% of maximum predicted HR. ([name] reviewed and wrote, "Although the report indicated she did  not reach target, I personally reviewed it and indicated her heart rate was greater than 85% max predicted heart rate. Therefore this was an adequate study and low risk.")   Neuro/Psych neg Seizures negative neurological ROS     GI/Hepatic negative GI ROS, Neg liver ROS,   Endo/Other  negative endocrine ROS  Renal/GU negative Renal ROS     Musculoskeletal  (+) Arthritis ,   Abdominal (+) + obese,   Peds  Hematology  (+) Blood dyscrasia, anemia ,   Anesthesia Other Findings   Reproductive/Obstetrics                            Anesthesia Physical Anesthesia Plan  ASA: III  Anesthesia Plan: General and Regional   Post-op Pain Management: GA combined w/ Regional for post-op pain   Induction: Intravenous  Airway Management Planned: LMA  Additional Equipment:   Intra-op Plan:   Post-operative Plan: Extubation in OR  Informed Consent: I have reviewed the patients History and Physical, chart, labs and discussed the procedure including the risks, benefits and alternatives for the proposed anesthesia with the patient or authorized representative who has indicated his/her understanding and acceptance.   Dental advisory given  Plan Discussed with: CRNA  Anesthesia Plan Comments: (Risks of general anesthesia discussed including, but not limited to, sore throat, hoarse voice, chipped/damaged teeth, injury to vocal cords, nausea and vomiting, allergic reactions, lung infection, heart attack, stroke, and death. All questions answered.  Discussed potential risks of nerve blocks including, but not limited to, infection, bleeding, nerve damage, seizures, pneumothorax, respiratory depression, and potential failure of the block. Alternatives to nerve blocks discussed. All questions answered. )      Anesthesia Quick Evaluation

## 2016-08-16 ENCOUNTER — Encounter (HOSPITAL_COMMUNITY): Payer: Self-pay | Admitting: *Deleted

## 2016-08-16 ENCOUNTER — Observation Stay (HOSPITAL_COMMUNITY)
Admission: RE | Admit: 2016-08-16 | Discharge: 2016-08-17 | Disposition: A | Discharge status: Home / Self Care | Payer: BLUE CROSS/BLUE SHIELD | Source: Ambulatory Visit | Attending: Orthopaedic Surgery | Admitting: Orthopaedic Surgery

## 2016-08-16 ENCOUNTER — Ambulatory Visit (HOSPITAL_COMMUNITY): Payer: BLUE CROSS/BLUE SHIELD | Admitting: Vascular Surgery

## 2016-08-16 ENCOUNTER — Encounter (HOSPITAL_COMMUNITY)
Admission: RE | Disposition: A | Discharge status: Home / Self Care | Payer: Self-pay | Source: Ambulatory Visit | Attending: Orthopaedic Surgery

## 2016-08-16 ENCOUNTER — Ambulatory Visit (HOSPITAL_COMMUNITY): Payer: BLUE CROSS/BLUE SHIELD

## 2016-08-16 DIAGNOSIS — W19XXXA Unspecified fall, initial encounter: Secondary | ICD-10-CM | POA: Insufficient documentation

## 2016-08-16 DIAGNOSIS — S82842A Displaced bimalleolar fracture of left lower leg, initial encounter for closed fracture: Secondary | ICD-10-CM

## 2016-08-16 DIAGNOSIS — I110 Hypertensive heart disease with heart failure: Secondary | ICD-10-CM | POA: Diagnosis not present

## 2016-08-16 DIAGNOSIS — J45909 Unspecified asthma, uncomplicated: Secondary | ICD-10-CM | POA: Diagnosis not present

## 2016-08-16 DIAGNOSIS — I5032 Chronic diastolic (congestive) heart failure: Secondary | ICD-10-CM | POA: Insufficient documentation

## 2016-08-16 DIAGNOSIS — M1990 Unspecified osteoarthritis, unspecified site: Secondary | ICD-10-CM | POA: Diagnosis not present

## 2016-08-16 DIAGNOSIS — I4891 Unspecified atrial fibrillation: Secondary | ICD-10-CM | POA: Diagnosis not present

## 2016-08-16 DIAGNOSIS — Z419 Encounter for procedure for purposes other than remedying health state, unspecified: Secondary | ICD-10-CM

## 2016-08-16 DIAGNOSIS — Z79899 Other long term (current) drug therapy: Secondary | ICD-10-CM | POA: Diagnosis not present

## 2016-08-16 DIAGNOSIS — S82852A Displaced trimalleolar fracture of left lower leg, initial encounter for closed fracture: Principal | ICD-10-CM | POA: Insufficient documentation

## 2016-08-16 DIAGNOSIS — Z7901 Long term (current) use of anticoagulants: Secondary | ICD-10-CM | POA: Insufficient documentation

## 2016-08-16 HISTORY — PX: ORIF ANKLE FRACTURE: SUR919

## 2016-08-16 HISTORY — DX: Unspecified osteoarthritis, unspecified site: M19.90

## 2016-08-16 HISTORY — PX: ORIF ANKLE FRACTURE: SHX5408

## 2016-08-16 HISTORY — DX: Pneumonia, unspecified organism: J18.9

## 2016-08-16 HISTORY — DX: Anemia, unspecified: D64.9

## 2016-08-16 LAB — CBC
HEMATOCRIT: 40.5 % (ref 36.0–46.0)
Hemoglobin: 13.5 g/dL (ref 12.0–15.0)
MCH: 32.9 pg (ref 26.0–34.0)
MCHC: 33.3 g/dL (ref 30.0–36.0)
MCV: 98.8 fL (ref 78.0–100.0)
Platelets: 202 10*3/uL (ref 150–400)
RBC: 4.1 MIL/uL (ref 3.87–5.11)
RDW: 12.2 % (ref 11.5–15.5)
WBC: 5.8 10*3/uL (ref 4.0–10.5)

## 2016-08-16 LAB — BASIC METABOLIC PANEL
Anion gap: 11 (ref 5–15)
BUN: 14 mg/dL (ref 6–20)
CHLORIDE: 104 mmol/L (ref 101–111)
CO2: 23 mmol/L (ref 22–32)
Calcium: 9.4 mg/dL (ref 8.9–10.3)
Creatinine, Ser: 0.88 mg/dL (ref 0.44–1.00)
GFR calc Af Amer: >60 mL/min (ref >60)
GFR calc non Af Amer: >60 mL/min (ref >60)
GLUCOSE: 116 mg/dL — AB (ref 65–99)
POTASSIUM: 3.8 mmol/L (ref 3.5–5.1)
Sodium: 138 mmol/L (ref 135–145)

## 2016-08-16 LAB — PROTIME-INR
INR: 1.04
Prothrombin Time: 13.6 seconds (ref 11.4–15.2)

## 2016-08-16 LAB — SURGICAL PCR SCREEN
MRSA, PCR: NEGATIVE
Staphylococcus aureus: NEGATIVE

## 2016-08-16 SURGERY — OPEN REDUCTION INTERNAL FIXATION (ORIF) ANKLE FRACTURE
Anesthesia: Regional | Site: Ankle | Laterality: Left

## 2016-08-16 MED ORDER — ONDANSETRON HCL 4 MG PO TABS: 4.0000 mg | ORAL_TABLET | Freq: Four times a day (QID) | ORAL | Status: DC | PRN

## 2016-08-16 MED ORDER — MIDAZOLAM HCL 5 MG/5ML IJ SOLN
INTRAMUSCULAR | Status: DC | PRN
Start: 2016-08-16 — End: 2016-08-16
  Administered 2016-08-16: 2 mg via INTRAVENOUS

## 2016-08-16 MED ORDER — LIDOCAINE 2% (20 MG/ML) 5 ML SYRINGE
INTRAMUSCULAR | Status: DC | PRN
  Administered 2016-08-16: 100 mg via INTRAVENOUS

## 2016-08-16 MED ORDER — PHENYLEPHRINE HCL 10 MG/ML IJ SOLN
INTRAMUSCULAR | Status: DC | PRN
  Administered 2016-08-16 (×2): 80 ug via INTRAVENOUS
  Administered 2016-08-16: 120 ug via INTRAVENOUS
  Administered 2016-08-16 (×2): 40 ug via INTRAVENOUS
  Administered 2016-08-16: 80 ug via INTRAVENOUS

## 2016-08-16 MED ORDER — ALBUTEROL SULFATE (2.5 MG/3ML) 0.083% IN NEBU: 3.0000 mL | INHALATION_SOLUTION | Freq: Four times a day (QID) | RESPIRATORY_TRACT | Status: DC | PRN

## 2016-08-16 MED ORDER — MIDAZOLAM HCL 2 MG/2ML IJ SOLN
INTRAMUSCULAR | Status: AC
  Filled 2016-08-16: qty 2

## 2016-08-16 MED ORDER — CEFAZOLIN IN D5W 1 GM/50ML IV SOLN
1.0000 g | Freq: Four times a day (QID) | INTRAVENOUS | Status: AC
  Administered 2016-08-16 – 2016-08-17 (×3): 1 g via INTRAVENOUS
  Filled 2016-08-16 (×3): qty 50

## 2016-08-16 MED ORDER — LIDOCAINE 2% (20 MG/ML) 5 ML SYRINGE
INTRAMUSCULAR | Status: AC
  Filled 2016-08-16: qty 5

## 2016-08-16 MED ORDER — DEXAMETHASONE SODIUM PHOSPHATE 4 MG/ML IJ SOLN
INTRAMUSCULAR | Status: DC | PRN
Start: 2016-08-16 — End: 2016-08-16
  Administered 2016-08-16: 10 mg via INTRAVENOUS

## 2016-08-16 MED ORDER — DEXMEDETOMIDINE HCL IN NACL 200 MCG/50ML IV SOLN
INTRAVENOUS | Status: AC
  Filled 2016-08-16: qty 50

## 2016-08-16 MED ORDER — ONDANSETRON HCL 4 MG/2ML IJ SOLN
4.0000 mg | Freq: Four times a day (QID) | INTRAMUSCULAR | Status: DC | PRN
Start: 2016-08-16 — End: 2016-08-17

## 2016-08-16 MED ORDER — LOSARTAN POTASSIUM 50 MG PO TABS
100.0000 mg | ORAL_TABLET | Freq: Every day | ORAL | Status: DC
  Administered 2016-08-16 – 2016-08-17 (×2): 100 mg via ORAL
  Filled 2016-08-16 (×2): qty 2

## 2016-08-16 MED ORDER — BUPIVACAINE-EPINEPHRINE (PF) 0.5% -1:200000 IJ SOLN
INTRAMUSCULAR | Status: DC | PRN
  Administered 2016-08-16: 20 mL via PERINEURAL
  Administered 2016-08-16: 30 mL via PERINEURAL

## 2016-08-16 MED ORDER — ONDANSETRON HCL 4 MG/2ML IJ SOLN
INTRAMUSCULAR | Status: AC
  Filled 2016-08-16: qty 2

## 2016-08-16 MED ORDER — ADULT MULTIVITAMIN W/MINERALS CH
1.0000 | ORAL_TABLET | Freq: Every day | ORAL | Status: DC
  Administered 2016-08-16 – 2016-08-17 (×2): 1 via ORAL
  Filled 2016-08-16 (×2): qty 1

## 2016-08-16 MED ORDER — DEXAMETHASONE SODIUM PHOSPHATE 10 MG/ML IJ SOLN
INTRAMUSCULAR | Status: AC
  Filled 2016-08-16: qty 1

## 2016-08-16 MED ORDER — ROCURONIUM BROMIDE 10 MG/ML (PF) SYRINGE
PREFILLED_SYRINGE | INTRAVENOUS | Status: AC
  Filled 2016-08-16: qty 10

## 2016-08-16 MED ORDER — MUPIROCIN 2 % EX OINT
1.0000 "application " | TOPICAL_OINTMENT | Freq: Once | CUTANEOUS | Status: AC
  Administered 2016-08-16: 1 via TOPICAL
  Filled 2016-08-16: qty 22

## 2016-08-16 MED ORDER — METOCLOPRAMIDE HCL 5 MG/ML IJ SOLN: 5.0000 mg | Freq: Three times a day (TID) | INTRAMUSCULAR | Status: DC | PRN

## 2016-08-16 MED ORDER — ACETAMINOPHEN 325 MG PO TABS: 650.0000 mg | ORAL_TABLET | Freq: Four times a day (QID) | ORAL | Status: DC | PRN

## 2016-08-16 MED ORDER — METOCLOPRAMIDE HCL 5 MG PO TABS: 5.0000 mg | ORAL_TABLET | Freq: Three times a day (TID) | ORAL | Status: DC | PRN

## 2016-08-16 MED ORDER — LOSARTAN POTASSIUM-HCTZ 100-25 MG PO TABS: 1.0000 | ORAL_TABLET | Freq: Every day | ORAL | Status: DC

## 2016-08-16 MED ORDER — FENTANYL CITRATE (PF) 100 MCG/2ML IJ SOLN: 25.0000 ug | INTRAMUSCULAR | Status: DC | PRN

## 2016-08-16 MED ORDER — OXYCODONE HCL 5 MG PO TABS
5.0000 mg | ORAL_TABLET | ORAL | Status: DC | PRN
  Administered 2016-08-17: 10 mg via ORAL
  Filled 2016-08-16 (×2): qty 2

## 2016-08-16 MED ORDER — HYDROMORPHONE HCL 1 MG/ML IJ SOLN: 0.5000 mg | INTRAMUSCULAR | Status: DC | PRN

## 2016-08-16 MED ORDER — FENTANYL CITRATE (PF) 100 MCG/2ML IJ SOLN
INTRAMUSCULAR | Status: AC
  Filled 2016-08-16: qty 2

## 2016-08-16 MED ORDER — LACTATED RINGERS IV SOLN
INTRAVENOUS | Status: DC
  Administered 2016-08-16 (×3): via INTRAVENOUS

## 2016-08-16 MED ORDER — GLYCOPYRROLATE 0.2 MG/ML IV SOSY
PREFILLED_SYRINGE | INTRAVENOUS | Status: AC
  Filled 2016-08-16: qty 3

## 2016-08-16 MED ORDER — PROPOFOL 10 MG/ML IV BOLUS
INTRAVENOUS | Status: AC
  Filled 2016-08-16: qty 20

## 2016-08-16 MED ORDER — EPHEDRINE 5 MG/ML INJ
INTRAVENOUS | Status: AC
  Filled 2016-08-16: qty 10

## 2016-08-16 MED ORDER — PROMETHAZINE HCL 25 MG/ML IJ SOLN: 6.2500 mg | INTRAMUSCULAR | Status: DC | PRN

## 2016-08-16 MED ORDER — METHOCARBAMOL 1000 MG/10ML IJ SOLN
500.0000 mg | Freq: Four times a day (QID) | INTRAVENOUS | Status: DC | PRN
  Filled 2016-08-16: qty 5

## 2016-08-16 MED ORDER — METHOCARBAMOL 500 MG PO TABS: 500.0000 mg | ORAL_TABLET | Freq: Four times a day (QID) | ORAL | Status: DC | PRN

## 2016-08-16 MED ORDER — PHENYLEPHRINE 40 MCG/ML (10ML) SYRINGE FOR IV PUSH (FOR BLOOD PRESSURE SUPPORT)
PREFILLED_SYRINGE | INTRAVENOUS | Status: AC
  Filled 2016-08-16: qty 10

## 2016-08-16 MED ORDER — DIPHENHYDRAMINE HCL 12.5 MG/5ML PO ELIX: 12.5000 mg | ORAL_SOLUTION | ORAL | Status: DC | PRN

## 2016-08-16 MED ORDER — ARTIFICIAL TEARS OP OINT
TOPICAL_OINTMENT | OPHTHALMIC | Status: AC
  Filled 2016-08-16: qty 3.5

## 2016-08-16 MED ORDER — ONDANSETRON HCL 4 MG/2ML IJ SOLN
INTRAMUSCULAR | Status: DC | PRN
  Administered 2016-08-16: 4 mg via INTRAVENOUS

## 2016-08-16 MED ORDER — FENTANYL CITRATE (PF) 100 MCG/2ML IJ SOLN
100.0000 ug | Freq: Once | INTRAMUSCULAR | Status: AC
  Administered 2016-08-16: 100 ug via INTRAVENOUS

## 2016-08-16 MED ORDER — SUGAMMADEX SODIUM 200 MG/2ML IV SOLN
INTRAVENOUS | Status: AC
  Filled 2016-08-16: qty 2

## 2016-08-16 MED ORDER — 0.9 % SODIUM CHLORIDE (POUR BTL) OPTIME
TOPICAL | Status: DC | PRN
  Administered 2016-08-16: 1000 mL

## 2016-08-16 MED ORDER — PROPOFOL 10 MG/ML IV BOLUS
INTRAVENOUS | Status: DC | PRN
  Administered 2016-08-16: 180 mg via INTRAVENOUS

## 2016-08-16 MED ORDER — VITAMIN D 1000 UNITS PO TABS
2000.0000 [IU] | ORAL_TABLET | Freq: Every day | ORAL | Status: DC
  Administered 2016-08-16 – 2016-08-17 (×2): 2000 [IU] via ORAL
  Filled 2016-08-16 (×2): qty 2

## 2016-08-16 MED ORDER — ACETAMINOPHEN 650 MG RE SUPP: 650.0000 mg | Freq: Four times a day (QID) | RECTAL | Status: DC | PRN

## 2016-08-16 MED ORDER — PHENYLEPHRINE HCL 10 MG/ML IJ SOLN
INTRAVENOUS | Status: DC | PRN
  Administered 2016-08-16: 25 ug/min via INTRAVENOUS

## 2016-08-16 MED ORDER — FENTANYL CITRATE (PF) 100 MCG/2ML IJ SOLN
INTRAMUSCULAR | Status: DC | PRN
  Administered 2016-08-16 (×2): 25 ug via INTRAVENOUS
  Administered 2016-08-16 (×2): 50 ug via INTRAVENOUS

## 2016-08-16 MED ORDER — PHENYLEPHRINE 40 MCG/ML (10ML) SYRINGE FOR IV PUSH (FOR BLOOD PRESSURE SUPPORT)
PREFILLED_SYRINGE | INTRAVENOUS | Status: AC
  Filled 2016-08-16: qty 20

## 2016-08-16 MED ORDER — SUCCINYLCHOLINE CHLORIDE 200 MG/10ML IV SOSY
PREFILLED_SYRINGE | INTRAVENOUS | Status: AC
  Filled 2016-08-16: qty 10

## 2016-08-16 MED ORDER — GLYCOPYRROLATE 0.2 MG/ML IJ SOLN
INTRAMUSCULAR | Status: DC | PRN
  Administered 2016-08-16: 0.2 mg via INTRAVENOUS

## 2016-08-16 MED ORDER — HYDROCHLOROTHIAZIDE 25 MG PO TABS
25.0000 mg | ORAL_TABLET | Freq: Every day | ORAL | Status: DC
  Administered 2016-08-16 – 2016-08-17 (×2): 25 mg via ORAL
  Filled 2016-08-16 (×2): qty 1

## 2016-08-16 MED ORDER — EPHEDRINE SULFATE 50 MG/ML IJ SOLN
INTRAMUSCULAR | Status: DC | PRN
  Administered 2016-08-16: 10 mg via INTRAVENOUS
  Administered 2016-08-16: 5 mg via INTRAVENOUS
  Administered 2016-08-16: 10 mg via INTRAVENOUS
  Administered 2016-08-16: 15 mg via INTRAVENOUS
  Administered 2016-08-16: 10 mg via INTRAVENOUS

## 2016-08-16 MED ORDER — BUSPIRONE HCL 5 MG PO TABS
5.0000 mg | ORAL_TABLET | Freq: Two times a day (BID) | ORAL | Status: DC
  Administered 2016-08-16 – 2016-08-17 (×2): 5 mg via ORAL
  Filled 2016-08-16 (×2): qty 1

## 2016-08-16 MED ORDER — DILTIAZEM HCL ER COATED BEADS 120 MG PO CP24
120.0000 mg | ORAL_CAPSULE | Freq: Every day | ORAL | Status: DC
  Administered 2016-08-17: 120 mg via ORAL
  Filled 2016-08-16: qty 1

## 2016-08-16 MED ORDER — APIXABAN 5 MG PO TABS
5.0000 mg | ORAL_TABLET | Freq: Two times a day (BID) | ORAL | Status: DC
  Administered 2016-08-16 – 2016-08-17 (×2): 5 mg via ORAL
  Filled 2016-08-16 (×2): qty 1

## 2016-08-16 MED ORDER — SODIUM CHLORIDE 0.9 % IV SOLN
INTRAVENOUS | Status: DC
  Administered 2016-08-16: 16:00:00 via INTRAVENOUS

## 2016-08-16 MED ORDER — CHLORHEXIDINE GLUCONATE 4 % EX LIQD: 60.0000 mL | Freq: Once | CUTANEOUS | Status: DC

## 2016-08-16 SURGICAL SUPPLY — 71 items
BANDAGE ACE 4X5 VEL STRL LF (GAUZE/BANDAGES/DRESSINGS) IMPLANT
BANDAGE ACE 6X5 VEL STRL LF (GAUZE/BANDAGES/DRESSINGS) IMPLANT
BANDAGE ELASTIC 4 VELCRO ST LF (GAUZE/BANDAGES/DRESSINGS) ×2 IMPLANT
BANDAGE ELASTIC 6 VELCRO ST LF (GAUZE/BANDAGES/DRESSINGS) ×3 IMPLANT
BANDAGE ESMARK 6X9 LF (GAUZE/BANDAGES/DRESSINGS) IMPLANT
BIT DRILL 110X2.5XQCK CNCT (BIT) IMPLANT
BIT DRILL 2.5 (BIT) ×3
BIT DRILL 2.7XCANN QCK CNCT (BIT) IMPLANT
BIT DRILL CANN 2.7 (BIT) ×2
BIT DRILL CANN 2.7MM (BIT) ×1
BIT DRL 110X2.5XQCK CNCT (BIT) ×1
BIT DRL 2.7XCANN QCK CNCT (BIT) ×1
BNDG CMPR 9X6 STRL LF SNTH (GAUZE/BANDAGES/DRESSINGS)
BNDG ESMARK 6X9 LF (GAUZE/BANDAGES/DRESSINGS)
COVER SURGICAL LIGHT HANDLE (MISCELLANEOUS) ×3 IMPLANT
CUFF TOURNIQUET SINGLE 34IN LL (TOURNIQUET CUFF) ×3 IMPLANT
CUFF TOURNIQUET SINGLE 44IN (TOURNIQUET CUFF) IMPLANT
DRAPE C-ARM 42X72 X-RAY (DRAPES) ×3 IMPLANT
DRAPE C-ARMOR (DRAPES) ×2 IMPLANT
DRAPE U-SHAPE 47X51 STRL (DRAPES) ×3 IMPLANT
DURAPREP 26ML APPLICATOR (WOUND CARE) ×5 IMPLANT
ELECT REM PT RETURN 9FT ADLT (ELECTROSURGICAL) ×3
ELECTRODE REM PT RTRN 9FT ADLT (ELECTROSURGICAL) ×1 IMPLANT
GAUZE SPONGE 4X4 12PLY STRL (GAUZE/BANDAGES/DRESSINGS) ×3 IMPLANT
GAUZE XEROFORM 5X9 LF (GAUZE/BANDAGES/DRESSINGS) ×3 IMPLANT
GLOVE BIO SURGEON STRL SZ8 (GLOVE) ×3 IMPLANT
GLOVE BIOGEL PI IND STRL 6.5 (GLOVE) ×1 IMPLANT
GLOVE BIOGEL PI INDICATOR 6.5 (GLOVE) ×2
GLOVE ECLIPSE 6.5 STRL STRAW (GLOVE) ×2 IMPLANT
GLOVE ORTHO TXT STRL SZ7.5 (GLOVE) ×3 IMPLANT
GOWN STRL REUS W/ TWL LRG LVL3 (GOWN DISPOSABLE) ×2 IMPLANT
GOWN STRL REUS W/ TWL XL LVL3 (GOWN DISPOSABLE) ×4 IMPLANT
GOWN STRL REUS W/TWL LRG LVL3 (GOWN DISPOSABLE) ×6
GOWN STRL REUS W/TWL XL LVL3 (GOWN DISPOSABLE) ×12
GUIDEWIRE PIN ORTH 6X1.6XSMTH (WIRE) IMPLANT
K-WIRE 1.6 (WIRE) ×3
KIT BASIN OR (CUSTOM PROCEDURE TRAY) ×3 IMPLANT
KIT ROOM TURNOVER OR (KITS) ×3 IMPLANT
MANIFOLD NEPTUNE II (INSTRUMENTS) ×3 IMPLANT
NEEDLE HYPO 25GX1X1/2 BEV (NEEDLE) IMPLANT
NS IRRIG 1000ML POUR BTL (IV SOLUTION) ×3 IMPLANT
PACK ORTHO EXTREMITY (CUSTOM PROCEDURE TRAY) ×3 IMPLANT
PAD ABD 8X10 STRL (GAUZE/BANDAGES/DRESSINGS) ×2 IMPLANT
PAD ARMBOARD 7.5X6 YLW CONV (MISCELLANEOUS) ×6 IMPLANT
PAD CAST 4YDX4 CTTN HI CHSV (CAST SUPPLIES) ×2 IMPLANT
PADDING CAST COTTON 4X4 STRL (CAST SUPPLIES) ×3
PADDING CAST COTTON 6X4 STRL (CAST SUPPLIES) ×3 IMPLANT
PLATE TUBULAR 8 H (Plate) ×3 IMPLANT
PREFILTER NEPTUNE (MISCELLANEOUS) ×3 IMPLANT
SCREW CANC 2.5XFT 16X4XST SM (Screw) IMPLANT
SCREW CANC 4.0X16 (Screw) ×6 IMPLANT
SCREW CANCELLOUS FT 4.0X14 (Screw) ×2 IMPLANT
SCREW CANN 4.0X48MM (Screw) ×4 IMPLANT
SCREW CORTICAL 3.5 16MM (Screw) ×2 IMPLANT
SCREW CORTICAL 3.5X12 (Screw) ×4 IMPLANT
SCREW CORTICAL 3.5X14 (Screw) ×3 IMPLANT
SPONGE LAP 4X18 X RAY DECT (DISPOSABLE) ×6 IMPLANT
SUCTION FRAZIER HANDLE 10FR (MISCELLANEOUS) ×2
SUCTION TUBE FRAZIER 10FR DISP (MISCELLANEOUS) ×1 IMPLANT
SUT ETHILON 2 0 FS 18 (SUTURE) ×3 IMPLANT
SUT VIC AB 0 CT1 27 (SUTURE) ×6
SUT VIC AB 0 CT1 27XBRD ANBCTR (SUTURE) ×2 IMPLANT
SUT VIC AB 2-0 CT1 27 (SUTURE) ×3
SUT VIC AB 2-0 CT1 27XBRD (SUTURE) ×1 IMPLANT
SUT VICRYL 0 CT 1 36IN (SUTURE) ×3 IMPLANT
SYR CONTROL 10ML LL (SYRINGE) IMPLANT
TOWEL OR 17X24 6PK STRL BLUE (TOWEL DISPOSABLE) ×3 IMPLANT
TOWEL OR 17X26 10 PK STRL BLUE (TOWEL DISPOSABLE) ×3 IMPLANT
TUBE CONNECTING 12'X1/4 (SUCTIONS) ×1
TUBE CONNECTING 12X1/4 (SUCTIONS) ×2 IMPLANT
WATER STERILE IRR 1000ML POUR (IV SOLUTION) ×3 IMPLANT

## 2016-08-16 NOTE — Transfer of Care (Signed)
Immediate Anesthesia Transfer of Care Note  Patient: [name] [name]  Procedure(s) Performed: Procedure(s): OPEN REDUCTION INTERNAL FIXATION (ORIF) LEFT BIMALLEOLAR ANKLE FRACTURE (Left)  Patient Location: PACU  Anesthesia Type:GA combined with regional for post-op pain  Level of Consciousness: awake, alert  and oriented  Airway & Oxygen Therapy: Patient Spontanous Breathing and Patient connected to face mask oxygen  Post-op Assessment: Report given to RN and Post -op Vital signs reviewed and stable  Post vital signs: Reviewed and stable  Last Vitals:  Vitals:   08/16/16 0900 08/16/16 0909  BP: 110/62 114/63  Pulse: 66 65  Resp: 18 15  Temp:      Last Pain:  Vitals:   08/16/16 0800  TempSrc:   PainSc: 3       Patients Stated Pain Goal: 2 (08/16/16 0800)  Complications: No apparent anesthesia complications

## 2016-08-16 NOTE — Anesthesia Procedure Notes (Signed)
Anesthesia Regional Block:  Nerve block type: Saphenous Block.  Pre-Anesthetic Checklist: ,, timeout performed, Correct Patient, Correct Site, Correct Laterality, Correct Procedure, Correct Position, site marked, Risks and benefits discussed,  Surgical consent,  Pre-op evaluation,  At surgeon's request and post-op pain management  Laterality: Left  Prep: chloraprep       Needles:  Injection technique: Single-shot  Needle Type: Echogenic Stimulator Needle     Needle Length: 9cm 9 cm Needle Gauge: 21 and 21 G    Additional Needles:  Procedures: ultrasound guided (picture in chart) Nerve block type: Saphenous Block. Narrative:  Start time: 08/16/2016 8:53 AM End time: 08/16/2016 8:55 AM Injection made incrementally with aspirations every 5 mL.  Performed by: Personally  Anesthesiologist: Linton RumpALLAN, Glynnis Gavel DICKERSON

## 2016-08-16 NOTE — Brief Op Note (Signed)
08/16/2016  12:39 PM  PATIENT:  Manning Charityuth A Knebel  65 y.o. female  PRE-OPERATIVE DIAGNOSIS:  left bimalleolar ankle fracture  POST-OPERATIVE DIAGNOSIS:  left bimalleolar ankle fracture  PROCEDURE:  Procedure(s): OPEN REDUCTION INTERNAL FIXATION (ORIF) LEFT BIMALLEOLAR ANKLE FRACTURE (Left)  SURGEON:  Surgeon(s) and Role:    * Kathryne Hitchhristopher Y Trystian Crisanto, MD - Primary  ANESTHESIA:   regional and general  EBL:  Total I/O In: 1100 [I.V.:1100] Out: 50 [Blood:50]  COUNTS:  YES  TOURNIQUET:   Total Tourniquet Time Documented: Thigh (Left) - 53 minutes Total: Thigh (Left) - 53 minutes  PLAN OF CARE: Admit for overnight observation  PATIENT DISPOSITION:  PACU - hemodynamically stable.   Delay start of Pharmacological VTE agent (>24hrs) due to surgical blood loss or risk of bleeding: no

## 2016-08-16 NOTE — Anesthesia Postprocedure Evaluation (Signed)
Anesthesia Post Note  Patient: Manning CharityRuth A Kroboth  Procedure(s) Performed: Procedure(s) (LRB): OPEN REDUCTION INTERNAL FIXATION (ORIF) LEFT BIMALLEOLAR ANKLE FRACTURE (Left)  Patient location during evaluation: PACU Anesthesia Type: General and Regional Level of consciousness: awake and alert Pain management: pain level controlled Vital Signs Assessment: post-procedure vital signs reviewed and stable Respiratory status: spontaneous breathing, nonlabored ventilation and respiratory function stable Cardiovascular status: blood pressure returned to baseline and stable Postop Assessment: no signs of nausea or vomiting Anesthetic complications: no    Last Vitals:  Vitals:   08/16/16 1255 08/16/16 1310  BP: 107/68 104/69  Pulse: 82 80  Resp: 18 16  Temp:      Last Pain:  Vitals:   08/16/16 0800  TempSrc:   PainSc: 3                  Linton RumpJennifer Dickerson Ashana Tullo

## 2016-08-16 NOTE — H&P (Signed)
[name] [name] is an 65 y.o. female.   Chief Complaint:   Left ankle pain with know fracture HPI:   65 yo female who 11 days ago sustained a mechanical fall suffering a left unstable ankle fracture.  She was placed appropriately in a splint by the ED staff and has been off of her left foot with ice and elevation.  Given the unstable nature of her fracture, surgery has been recommended.  The risks and benefits have been discussed in detail.  Past Medical History:  Diagnosis Date  . A-fib (HCC)   . Anemia    during pneumonia  . Arthritis   . Asthma   . Hypertension   . Pneumonia     Past Surgical History:  Procedure Laterality Date  . c section     1973 and 1983  . COLONOSCOPY    . TUBAL LIGATION      Family History  Problem Relation Age of Onset  . Atrial fibrillation Mother   . Hypertension Mother   . Asthma Father   . Heart attack Father    Social History:  reports that she has never smoked. She has never used smokeless tobacco. She reports that she does not drink alcohol or use drugs.  Allergies:  Allergies  Allergen Reactions  . No Known Allergies     Medications Prior to Admission  Medication Sig Dispense Refill  . acetaminophen (TYLENOL) 500 MG tablet Take 1,000 mg by mouth every 6 (six) hours as needed for mild pain.    Marland Kitchen. albuterol (PROVENTIL HFA;VENTOLIN HFA) 108 (90 Base) MCG/ACT inhaler Inhale 2 puffs into the lungs every 6 (six) hours as needed for wheezing or shortness of breath.     Marland Kitchen. apixaban (ELIQUIS) 5 MG TABS tablet Take 1 tablet (5 mg total) by mouth 2 (two) times daily. 60 tablet 5  . B Complex-C (B-COMPLEX WITH VITAMIN C) tablet Take 1 tablet by mouth daily.    . busPIRone (BUSPAR) 5 MG tablet Take 5 mg by mouth 2 (two) times daily.     . cholecalciferol (VITAMIN D) 1000 units tablet Take 2,000 Units by mouth daily.    Marland Kitchen. diltiazem (CARDIZEM CD) 120 MG 24 hr capsule Take 1 capsule (120 mg total) by mouth daily. 30 capsule 5  .  losartan-hydrochlorothiazide (HYZAAR) 100-25 MG tablet Take 1 tablet by mouth daily.    . Multiple Vitamin (MULTIVITAMIN WITH MINERALS) TABS tablet Take 1 tablet by mouth daily.    Marland Kitchen. oxyCODONE-acetaminophen (PERCOCET/ROXICET) 5-325 MG tablet Take 2 tablets by mouth every 4 (four) hours as needed for severe pain. (Patient taking differently: Take 1 tablet by mouth every 4 (four) hours as needed for severe pain. ) 6 tablet 0    No results found for this or any previous visit (from the past 48 hour(s)). No results found.  Review of Systems  All other systems reviewed and are negative.   Blood pressure 123/72, pulse 71, temperature 98.4 F (36.9 C), temperature source Oral, resp. rate 20, height 5\' 4"  (1.626 m), weight 88.5 kg (195 lb), SpO2 98 %. Physical Exam  Constitutional: She is oriented to person, place, and time. She appears well-developed and well-nourished.  HENT:  Head: Normocephalic and atraumatic.  Eyes: EOM are normal. Pupils are equal, round, and reactive to light.  Neck: Normal range of motion. Neck supple.  Cardiovascular: Normal rate and regular rhythm.   Respiratory: Effort normal and breath sounds normal.  GI: Soft. Bowel sounds are normal.  Musculoskeletal:  Left ankle: She exhibits decreased range of motion, swelling, ecchymosis and deformity. Tenderness. Lateral malleolus and medial malleolus tenderness found.  Neurological: She is alert and oriented to person, place, and time.  Skin: Skin is warm and dry.  Psychiatric: She has a normal mood and affect.     Assessment/Plan Left unstable ankle fracture 1)  To the OR today for open reduction/internal fixation of her left ankle lateral and medial malleolus.  Kathryne Hitch, MD 08/16/2016, 7:26 AM

## 2016-08-16 NOTE — Final Progress Note (Signed)
Received patient alert, oriented and responsive, was started on clears at PACU and tolerated it as per nurse.

## 2016-08-16 NOTE — Anesthesia Procedure Notes (Signed)
Procedure Name: LMA Insertion Date/Time: 08/16/2016 11:22 AM Performed by: Doyce LooseHOFFMAN, Waino Mounsey ANN Pre-anesthesia Checklist: Patient identified, Emergency Drugs available, Suction available and Patient being monitored Patient Re-evaluated:Patient Re-evaluated prior to inductionOxygen Delivery Method: Circle System Utilized Preoxygenation: Pre-oxygenation with 100% oxygen Intubation Type: IV induction Ventilation: Mask ventilation without difficulty LMA: LMA inserted LMA Size: 4.0 Number of attempts: 1 Airway Equipment and Method: Bite block Placement Confirmation: positive ETCO2 Tube secured with: Tape Dental Injury: Teeth and Oropharynx as per pre-operative assessment

## 2016-08-16 NOTE — Progress Notes (Signed)
Orthopedic Tech Progress Note Patient Details:  [name] 02/21/1951 [id number]  Ortho Devices Type of Ortho Device: CAM walker Ortho Device/Splint Location: LLE Ortho Device/Splint Interventions: Ordered, Application   Jennye MoccasinHughes, Narissa Beaufort Craig 08/16/2016, 3:58 PM

## 2016-08-16 NOTE — Anesthesia Procedure Notes (Signed)
Anesthesia Regional Block:  Popliteal block  Pre-Anesthetic Checklist: ,, timeout performed, Correct Patient, Correct Site, Correct Laterality, Correct Procedure, Correct Position, site marked, Risks and benefits discussed,  Surgical consent,  Pre-op evaluation,  At surgeon's request and post-op pain management  Laterality: Left  Prep: chloraprep       Needles:  Injection technique: Single-shot  Needle Type: Echogenic Stimulator Needle     Needle Length: 9cm 9 cm Needle Gauge: 21 and 21 G    Additional Needles:  Procedures: ultrasound guided (picture in chart) Popliteal block Narrative:  Start time: 08/16/2016 8:50 AM End time: 08/16/2016 8:53 AM Injection made incrementally with aspirations every 5 mL.  Performed by: Personally  Anesthesiologist: Linton RumpALLAN, Daine Croker DICKERSON

## 2016-08-17 DIAGNOSIS — S82852A Displaced trimalleolar fracture of left lower leg, initial encounter for closed fracture: Secondary | ICD-10-CM | POA: Diagnosis not present

## 2016-08-17 MED ORDER — OXYCODONE-ACETAMINOPHEN 5-325 MG PO TABS: 1.0000 | ORAL_TABLET | ORAL | 0 refills | Status: DC | PRN

## 2016-08-17 NOTE — Op Note (Signed)
NAMEKEARA, PAGLIARULO NO.:  [id number]  MEDICAL RECORD NO.:  [id number]  LOCATION:  6N31C                        FACILITY:  [facility]  PHYSICIAN:  Vanita Panda. Magnus Ivan, M.D.DATE OF BIRTH:  Mar 31, 1951  DATE OF PROCEDURE:  08/16/2016 DATE OF DISCHARGE:                              OPERATIVE REPORT   PREOPERATIVE DIAGNOSIS:  Left ankle trimalleolar fracture.  POSTOPERATIVE DIAGNOSIS:  Left ankle trimalleolar fracture.  PROCEDURE:  Open reduction and internal fixation of left ankle with fixation of the lateral and medial malleolus only.  SURGEON:  Vanita Panda. Magnus Ivan, M.D.  ASSISTANT:  OR staff.  ANESTHESIA: 1. Regional left lower extremity block. 2. General.  TOURNIQUET TIME:  Under 1 hour.  ANTIBIOTIC:  2 g of IV Ancef.  BLOOD LOSS:  Minimal.  COMPLICATIONS:  None.  INDICATIONS:  Ms. Blass is a 65 year old female, who 11 days ago sustained a mechanical fall suffering a minimally displaced trimalleolar ankle fracture of her left ankle, it was not dislocated at all, and she was placed appropriately in a splint.  We waited until today to allow soft tissue swelling to calm down.  She has been nonweightbearing and compliant with ice and elevation.  She understands that we do need to fix the lateral and medial malleolus given the unstable nature of this fracture and the reason behind proceeding with surgery.  She is someone who is a very active 66 year old, who actually even drives a forklift.  PROCEDURE DESCRIPTION:  After informed consent was obtained, appropriate left ankle was marked.  Anesthesia obtained regional anesthesia.  She was then brought to the operating room, placed supine on the operating table.  General anesthesia was then obtained.  A nonsterile tourniquet was placed around her upper thigh and hip area to internally rotate her leg, and nonsterile tourniquet was placed around her upper thigh, and her leg was prepped and draped from  the knee down to the ankle with DuraPrep and sterile drapes.  Time-out was called to identify correct patient, correct left ankle.  We then used Esmarch to wrap out the leg, and the tourniquet was inflated to 300 mm of pressure.  I then made an incision over the fibula laterally and carried this proximally and distally.  We dissected down the fibular fracture and it was definitely a high fibular fracture.  We were able to reduce this, and I placed an 8- hole Zimmer one-third semitubular plate in antiglide position along the posterolateral cortex of the fibula.  We secured this with bicortical screws proximally and cancellous screws distally.  This reduced the ankle mortise as well.  I then made incision medially and I was able to put 2 temporary guide pins through the medial malleolus fracture holding this in place.  I then placed 2 partially-threaded 4.0 cancellous screws into the medial malleolus, holding this together.  I then stressed the ankle mortise under direct fluoroscopy and it was completely stable.  We irrigated the soft tissue with normal saline solution and closed the deep tissue over each incision with 0 Vicryl followed by 2-0 Vicryl subcutaneous tissue, interrupted 2-0 nylon on the skin.  Xeroform and well-padded sterile dressing were applied.  The  tourniquet was let down. Her toes pinked nicely.  She was awakened, extubated, and taken to the recovery room in stable condition.  All final counts were correct. There were no complications noted.     Vanita Pandahristopher Y. Magnus IvanBlackman, M.D.     CYB/MEDQ  D:  08/16/2016  T:  08/17/2016  Job:  [id number]

## 2016-08-17 NOTE — Discharge Instructions (Signed)
Expect left ankle and foot swelling; ice and elevation as needed. No weight at all on your left foot/ankle. Wear the boot when you are up. Keep your current dressing clean and dry. You may remove your left ankle dressings in 6 days and start getting your incisions wet in the shower. New dry dressings daily in 6 days.  Information on my medicine - ELIQUIS (apixaban)  Why was Eliquis prescribed for you? Eliquis was prescribed for you to reduce the risk of a blood clot forming that can cause a stroke if you have a medical condition called atrial fibrillation (a type of irregular heartbeat).  What do You need to know about Eliquis ? Take your Eliquis TWICE DAILY - one tablet in the morning and one tablet in the evening with or without food. If you have difficulty swallowing the tablet whole please discuss with your pharmacist how to take the medication safely.  Take Eliquis exactly as prescribed by your doctor and DO NOT stop taking Eliquis without talking to the doctor who prescribed the medication.  Stopping may increase your risk of developing a stroke.  Refill your prescription before you run out.  After discharge, you should have regular check-up appointments with your healthcare provider that is prescribing your Eliquis.  In the future your dose may need to be changed if your kidney function or weight changes by a significant amount or as you get older.  What do you do if you miss a dose? If you miss a dose, take it as soon as you remember on the same day and resume taking twice daily.  Do not take more than one dose of ELIQUIS at the same time to make up a missed dose.  Important Safety Information A possible side effect of Eliquis is bleeding. You should call your healthcare provider right away if you experience any of the following: ? Bleeding from an injury or your nose that does not stop. ? Unusual colored urine (red or dark brown) or unusual colored stools (red or  black). ? Unusual bruising for unknown reasons. ? A serious fall or if you hit your head (even if there is no bleeding).  Some medicines may interact with Eliquis and might increase your risk of bleeding or clotting while on Eliquis. To help avoid this, consult your healthcare provider or pharmacist prior to using any new prescription or non-prescription medications, including herbals, vitamins, non-steroidal anti-inflammatory drugs (NSAIDs) and supplements.  This website has more information on Eliquis (apixaban): http://www.eliquis.com/eliquis/home

## 2016-08-17 NOTE — Progress Notes (Signed)
Subjective: 1 Day Post-Op Procedure(s) (LRB): OPEN REDUCTION INTERNAL FIXATION (ORIF) LEFT BIMALLEOLAR ANKLE FRACTURE (Left) Patient reports pain as mild.    Objective: Vital signs in last 24 hours: Temp:  [97.4 F (36.3 C)-99 F (37.2 C)] 97.9 F (36.6 C) (09/22 0500) Pulse Rate:  [64-94] 94 (09/22 0500) Resp:  [15-21] 19 (09/22 0500) BP: (100-123)/(62-72) 100/66 (09/22 0500) SpO2:  [94 %-99 %] 95 % (09/22 0500) Weight:  [88.5 kg (195 lb)] 88.5 kg (195 lb) (09/21 0723)  Intake/Output from previous day: 09/21 0701 - 09/22 0700 In: 2170 [P.O.:100; I.V.:1920; IV Piggyback:150] Out: 1450 [Urine:1400; Blood:50] Intake/Output this shift: Total I/O In: 877.5 [I.V.:777.5; IV Piggyback:100] Out: 950 [Urine:950]   Recent Labs  08/16/16 0727  HGB 13.5    Recent Labs  08/16/16 0727  WBC 5.8  RBC 4.10  HCT 40.5  PLT 202    Recent Labs  08/16/16 0727  NA 138  K 3.8  CL 104  CO2 23  BUN 14  CREATININE 0.88  GLUCOSE 116*  CALCIUM 9.4    Recent Labs  08/16/16 0727  INR 1.04    Intact pulses distally Dorsiflexion/Plantar flexion intact Incision: dressing C/D/I Compartment soft  Assessment/Plan: 1 Day Post-Op Procedure(s) (LRB): OPEN REDUCTION INTERNAL FIXATION (ORIF) LEFT BIMALLEOLAR ANKLE FRACTURE (Left) D/C IV fluids Discharge home with home health today  Kathryne HitchChristopher Y Emmelia Holdsworth 08/17/2016, 6:55 AM

## 2016-08-17 NOTE — Care Management Note (Signed)
Case Management Note  Patient Details  Name: [name] [name] MRN: [id number] Date of Birth: Jul 02, 1951  Subjective/Objective:                    Action/Plan: Patient already has walker at home. Patient will be discharging to [address] , [place] [id number] phone [phone]  Expected Discharge Date:                  Expected Discharge Plan:  Home w Home Health Services  In-House Referral:     Discharge planning Services  CM Consult  Post Acute Care Choice:  Home Health Choice offered to:  Patient  DME Arranged:    DME Agency:     HH Arranged:  PT HH Agency:  [facility]  Status of Service:  Completed, signed off  If discussed at Long Length of Stay Meetings, dates discussed:    Additional Comments:  [name] [name], [name] Willis Marie, RN 08/17/2016, 9:30 AM

## 2016-08-17 NOTE — Discharge Summary (Signed)
Patient ID: [name] [name] MRN: [id number] DOB/AGE: 1951/01/09 65 y.o.  Admit date: 08/16/2016 Discharge date: 08/17/2016  Admission Diagnoses:  Principal Problem:   Closed displaced bimalleolar fracture of left ankle   Discharge Diagnoses:  Same  Past Medical History:  Diagnosis Date  . A-fib (HCC)   . Anemia    during pneumonia  . Arthritis   . Asthma   . Hypertension   . Pneumonia     Surgeries: Procedure(s): OPEN REDUCTION INTERNAL FIXATION (ORIF) LEFT BIMALLEOLAR ANKLE FRACTURE on 08/16/2016   Consultants:   Discharged Condition: Improved  Hospital Course: [name] [name] is an 65 y.o. female who was admitted 08/16/2016 for operative treatment ofClosed displaced bimalleolar fracture of left ankle. Patient has severe unremitting pain that affects sleep, daily activities, and work/hobbies. After pre-op clearance the patient was taken to the operating room on 08/16/2016 and underwent  Procedure(s): OPEN REDUCTION INTERNAL FIXATION (ORIF) LEFT BIMALLEOLAR ANKLE FRACTURE.    Patient was given perioperative antibiotics: Anti-infectives    Start     Dose/Rate Route Frequency Ordered Stop   08/16/16 1700  ceFAZolin (ANCEF) IVPB 1 g/50 mL premix     1 g 100 mL/hr over 30 Minutes Intravenous Every 6 hours 08/16/16 1543 08/17/16 0537   08/16/16 0930  ceFAZolin (ANCEF) IVPB 2g/100 mL premix     2 g 200 mL/hr over 30 Minutes Intravenous To ShortStay Surgical 08/15/16 1320 08/16/16 1124       Patient was given sequential compression devices, early ambulation, and chemoprophylaxis to prevent DVT.  Patient benefited maximally from hospital stay and there were no complications.    Recent vital signs: Patient Vitals for the past 24 hrs:  BP Temp Temp src Pulse Resp SpO2 Height Weight  08/17/16 0500 100/66 97.9 F (36.6 C) Oral 94 19 95 % - -  08/16/16 2018 101/66 98.2 F (36.8 C) Oral 93 19 94 % - -  08/16/16 1829 101/64 99 F (37.2 C) Oral 91 20 94 % - -  08/16/16 1552 106/71  97.4 F (36.3 C) Oral 74 20 95 % - -  08/16/16 1440 103/64 - - 68 20 98 % - -  08/16/16 1425 103/65 - - 78 (!) 21 98 % - -  08/16/16 1410 105/66 - - 81 19 98 % - -  08/16/16 1355 103/68 - - 73 20 98 % - -  08/16/16 1340 100/68 - - 76 19 98 % - -  08/16/16 1325 102/70 - - 76 16 98 % - -  08/16/16 1310 104/69 - - 80 16 97 % - -  08/16/16 1255 107/68 - - 82 18 99 % - -  08/16/16 1240 104/66 97.9 F (36.6 C) - 94 19 96 % - -  08/16/16 0909 114/63 - - 65 15 97 % - -  08/16/16 0900 110/62 - - 66 18 96 % - -  08/16/16 0854 118/64 - - 64 16 98 % - -  08/16/16 0723 123/72 98.4 F (36.9 C) Oral 71 20 98 % 5\' 4"  (1.626 m) 88.5 kg (195 lb)     Recent laboratory studies:  Recent Labs  08/16/16 0727  WBC 5.8  HGB 13.5  HCT 40.5  PLT 202  NA 138  K 3.8  CL 104  CO2 23  BUN 14  CREATININE 0.88  GLUCOSE 116*  INR 1.04  CALCIUM 9.4     Discharge Medications:     Medication List    TAKE these medications  acetaminophen 500 MG tablet Commonly known as:  TYLENOL Take 1,000 mg by mouth every 6 (six) hours as needed for mild pain.   albuterol 108 (90 Base) MCG/ACT inhaler Commonly known as:  PROVENTIL HFA;VENTOLIN HFA Inhale 2 puffs into the lungs every 6 (six) hours as needed for wheezing or shortness of breath.   apixaban 5 MG Tabs tablet Commonly known as:  ELIQUIS Take 1 tablet (5 mg total) by mouth 2 (two) times daily.   B-complex with vitamin C tablet Take 1 tablet by mouth daily.   busPIRone 5 MG tablet Commonly known as:  BUSPAR Take 5 mg by mouth 2 (two) times daily.   cholecalciferol 1000 units tablet Commonly known as:  VITAMIN D Take 2,000 Units by mouth daily.   diltiazem 120 MG 24 hr capsule Commonly known as:  CARDIZEM CD Take 1 capsule (120 mg total) by mouth daily.   losartan-hydrochlorothiazide 100-25 MG tablet Commonly known as:  HYZAAR Take 1 tablet by mouth daily.   multivitamin with minerals Tabs tablet Take 1 tablet by mouth daily.    oxyCODONE-acetaminophen 5-325 MG tablet Commonly known as:  PERCOCET/ROXICET Take 1-2 tablets by mouth every 4 (four) hours as needed for severe pain. What changed:  how much to take       Diagnostic Studies: Dg Ankle Complete Left  Result Date: 08/16/2016 CLINICAL DATA:  Left ankle of ORIF EXAM: LEFT ANKLE COMPLETE - 3+ VIEW; DG C-ARM 61-120 MIN COMPARISON:  08/05/2016 FLUOROSCOPY TIME:  Fluoroscopy Time:  53 seconds Radiation Exposure Index (if provided by the fluoroscopic device): Not available Number of Acquired Spot Images: 3 FINDINGS: Fixation sideplate is noted along the distal fibula with multiple screws. The fibular fracture is in near anatomic alignment. Two fixation screws traverse the medial malleolus. Fracture fragments are in near anatomic alignment. IMPRESSION: Status post ORIF of left ankle fractures. Electronically Signed   By: Alcide CleverMark  Lukens M.D.   On: 08/16/2016 12:28   Dg Ankle Complete Left  Result Date: 08/05/2016 CLINICAL DATA:  Left ankle pain after fall EXAM: LEFT ANKLE COMPLETE - 3+ VIEW COMPARISON:  None. FINDINGS: There is a transverse medial malleolus fracture, nondisplaced. There is an oblique left distal fibular metaphysis fracture with 4 mm lateral displacement of the distal fracture fragment. There is a nondisplaced posterior malleolus fracture in the left distal tibia. No left ankle subluxation. No suspicious focal osseous lesion. Diffuse left ankle soft tissue swelling. Small Achilles and plantar left calcaneal spurs. IMPRESSION: Trimalleolar left ankle fractures as described.  No subluxation. Electronically Signed   By: Delbert PhenixJason A Poff M.D.   On: 08/05/2016 17:33   Dg Knee Complete 4 Views Left  Result Date: 08/05/2016 CLINICAL DATA:  Fall.  Left knee pain. EXAM: LEFT KNEE - COMPLETE 4+ VIEW COMPARISON:  None. FINDINGS: No evidence of fracture, dislocation, or joint effusion. No evidence of arthropathy or other focal bone abnormality. Soft tissues are unremarkable.  IMPRESSION: Negative. Electronically Signed   By: Delbert PhenixJason A Poff M.D.   On: 08/05/2016 17:31   Dg C-arm 1-60 Min  Result Date: 08/16/2016 CLINICAL DATA:  Left ankle of ORIF EXAM: LEFT ANKLE COMPLETE - 3+ VIEW; DG C-ARM 61-120 MIN COMPARISON:  08/05/2016 FLUOROSCOPY TIME:  Fluoroscopy Time:  53 seconds Radiation Exposure Index (if provided by the fluoroscopic device): Not available Number of Acquired Spot Images: 3 FINDINGS: Fixation sideplate is noted along the distal fibula with multiple screws. The fibular fracture is in near anatomic alignment. Two fixation screws traverse the medial  malleolus. Fracture fragments are in near anatomic alignment. IMPRESSION: Status post ORIF of left ankle fractures. Electronically Signed   By: Alcide Clever M.D.   On: 08/16/2016 12:28    Disposition: 01-Home or Self Care  Discharge Instructions    Call MD / Call 911    Complete by:  As directed    If you experience chest pain or shortness of breath, CALL 911 and be transported to the hospital emergency room.  If you develope a fever above 101 F, pus (white drainage) or increased drainage or redness at the wound, or calf pain, call your surgeon's office.   Constipation Prevention    Complete by:  As directed    Drink plenty of fluids.  Prune juice may be helpful.  You may use a stool softener, such as Colace (over the counter) 100 mg twice a day.  Use MiraLax (over the counter) for constipation as needed.   Diet - low sodium heart healthy    Complete by:  As directed    Discharge patient    Complete by:  As directed    Increase activity slowly as tolerated    Complete by:  As directed       Follow-up Information    Kathryne Hitch, MD Follow up in 2 week(s).   Specialty:  Orthopedic Surgery Contact information: [address] [place] [address] [phone]            Signed: Kathryne Hitch 08/17/2016, 6:58 AM

## 2016-08-17 NOTE — Evaluation (Signed)
Physical Therapy Evaluation Patient Details Name: [name] [name] MRN: [id number] DOB: 1950/12/19 Today's Date: 08/17/2016   History of Present Illness  65 yo female who sustained a mechanical fall on 08/05/16 with resulting left unstable ankle fracture and now s/p ORIF. PMH: HTN, asthma, a-fib.   Clinical Impression  Pt demonstrating good initial mobility with ambulation, going 75 ft with rw and supervision. Extensive time spent with pt on stairs and how to safely perform to enter her home. Pt reports feeling like she will be able to safety enter her home with assistance. Anticipate that the pt will D/C home with friends and family to assist. PT to continue to follow and progress as tolerated.     Follow Up Recommendations Home health PT;Supervision - Intermittent    Equipment Recommendations  None recommended by PT (pt declines need for any equipment)    Recommendations for Other Services       Precautions / Restrictions Precautions Precautions: Fall Required Braces or Orthoses: Other Brace/Splint Other Brace/Splint: CAM boot Lt  Restrictions Weight Bearing Restrictions: Yes LLE Weight Bearing: Non weight bearing      Mobility  Bed Mobility Overal bed mobility: Independent             General bed mobility comments: supine-sit  Transfers Overall transfer level: Needs assistance Equipment used: Rolling walker (2 wheeled) Transfers: Sit to/from Stand Sit to Stand: Supervision         General transfer comment: cues for hand position, good stability.   Ambulation/Gait Ambulation/Gait assistance: Supervision Ambulation Distance (Feet): 75 Feet Assistive device: Rolling walker (2 wheeled) Gait Pattern/deviations:  (swing-to pattern) Gait velocity: decreased   General Gait Details: No loss of balance during ambulation, fatigued by end of session.   Stairs Stairs: Yes Stairs assistance: Min assist Stair Management: No rails;Backwards;With walker Number of Stairs:  2 General stair comments: Pt performing stairs with education on technique. Min assist provided to stabolize walker. Discussed recommendation of this technique for safety. Pt reports that she understands and declines attempting any further attempts.   Wheelchair Mobility    Modified Rankin (Stroke Patients Only)       Balance Overall balance assessment: Needs assistance Sitting-balance support: No upper extremity supported Sitting balance-Leahy Scale: Good     Standing balance support: Bilateral upper extremity supported Standing balance-Leahy Scale: Poor Standing balance comment: using rw                             Pertinent Vitals/Pain Pain Assessment: Faces Faces Pain Scale: Hurts a little bit Pain Location: Lt foot Pain Descriptors / Indicators: Sore;Numbness Pain Intervention(s): Monitored during session    Home Living Family/patient expects to be discharged to:: Private residence Living Arrangements: Non-relatives/Friends Available Help at Discharge: Friend(s);Available PRN/intermittently Type of Home: House Home Access: Stairs to enter Entrance Stairs-Rails: Doctor, general practiceight;Left Entrance Stairs-Number of Steps: 3 Home Layout: One level Home Equipment: Walker - standard Additional Comments: Pt states that she will have assistance at home.     Prior Function Level of Independence: Independent with assistive device(s)         Comments: using standard walker     Hand Dominance        Extremity/Trunk Assessment   Upper Extremity Assessment: Overall WFL for tasks assessed           Lower Extremity Assessment: LLE deficits/detail   LLE Deficits / Details: able to perform SLR and move toes  Communication   Communication: No difficulties  Cognition Arousal/Alertness: Awake/alert Behavior During Therapy: WFL for tasks assessed/performed Overall Cognitive Status: Within Functional Limits for tasks assessed                       General Comments General comments (skin integrity, edema, etc.): Pt educated on application of CAM boot.     Exercises     Assessment/Plan    PT Assessment Patient needs continued PT services  PT Problem List Decreased strength;Decreased range of motion;Decreased activity tolerance;Decreased balance;Decreased mobility          PT Treatment Interventions DME instruction;Gait training;Stair training;Functional mobility training;Therapeutic activities;Therapeutic exercise    PT Goals (Current goals can be found in the Care Plan section)  Acute Rehab PT Goals Patient Stated Goal: go home PT Goal Formulation: With patient Time For Goal Achievement: 08/24/16 Potential to Achieve Goals: Good    Frequency Min 3X/week   Barriers to discharge        Co-evaluation               End of Session Equipment Utilized During Treatment: Gait belt Activity Tolerance: Patient tolerated treatment well Patient left: in chair;with call bell/phone within reach Nurse Communication: Mobility status    Functional Assessment Tool Used: clinical judgment Functional Limitation: Mobility: Walking and moving around Mobility: Walking and Moving Around Current Status [phone]): At least 20 percent but less than 40 percent impaired, limited or restricted Mobility: Walking and Moving Around Goal Status [phone]): At least 1 percent but less than 20 percent impaired, limited or restricted    Time: 1203-1248 PT Time Calculation (min) (ACUTE ONLY): 45 min   Charges:   PT Evaluation $PT Eval Moderate Complexity: 1 Procedure PT Treatments $Gait Training: 23-37 mins   PT G Codes:   PT G-Codes **NOT FOR INPATIENT CLASS** Functional Assessment Tool Used: clinical judgment Functional Limitation: Mobility: Walking and moving around Mobility: Walking and Moving Around Current Status (U9811): At least 20 percent but less than 40 percent impaired, limited or restricted Mobility: Walking and Moving Around  Goal Status [phone]): At least 1 percent but less than 20 percent impaired, limited or restricted    Christiane Ha, PT, CSCS Pager [phone] Office [phone]  08/17/2016, 1:42 PM

## 2016-08-17 NOTE — Discharge Planning (Signed)
AVS and rx given to pt. Who verbalizes understanding. Given a few dressings for ankle drsg changes in 6 days. DC to private car with all personal belongings accompanied by daughter at 1500.

## 2016-08-20 ENCOUNTER — Encounter (HOSPITAL_COMMUNITY): Payer: Self-pay | Admitting: Orthopaedic Surgery

## 2016-08-30 ENCOUNTER — Inpatient Hospital Stay (INDEPENDENT_AMBULATORY_CARE_PROVIDER_SITE_OTHER): Payer: BLUE CROSS/BLUE SHIELD | Admitting: Orthopaedic Surgery

## 2016-08-30 DIAGNOSIS — S82842D Displaced bimalleolar fracture of left lower leg, subsequent encounter for closed fracture with routine healing: Secondary | ICD-10-CM

## 2016-08-31 ENCOUNTER — Encounter: Payer: Self-pay | Admitting: Internal Medicine

## 2016-08-31 ENCOUNTER — Ambulatory Visit (INDEPENDENT_AMBULATORY_CARE_PROVIDER_SITE_OTHER): Payer: BLUE CROSS/BLUE SHIELD | Admitting: Internal Medicine

## 2016-08-31 VITALS — BP 124/76 | HR 76 | Ht 64.0 in | Wt 194.2 lb

## 2016-08-31 DIAGNOSIS — I10 Essential (primary) hypertension: Secondary | ICD-10-CM | POA: Diagnosis not present

## 2016-08-31 DIAGNOSIS — Z7901 Long term (current) use of anticoagulants: Secondary | ICD-10-CM | POA: Diagnosis not present

## 2016-08-31 DIAGNOSIS — I480 Paroxysmal atrial fibrillation: Secondary | ICD-10-CM

## 2016-08-31 MED ORDER — LOSARTAN POTASSIUM-HCTZ 100-25 MG PO TABS: 1.0000 | ORAL_TABLET | Freq: Every day | ORAL | 9 refills | Status: DC

## 2016-08-31 MED ORDER — APIXABAN 5 MG PO TABS: 5.0000 mg | ORAL_TABLET | Freq: Two times a day (BID) | ORAL | 9 refills | Status: DC

## 2016-08-31 NOTE — Progress Notes (Signed)
OFFICE NOTE  Chief Complaint:  Recent leg fracture, otherwise doing well  Primary Care Physician: Evlyn Courier, MD  HPI:  SHAUNIKA ITALIANO is a 65 y.o. female with a past medical history significant for hypertension and asthma. She presented with onset of aching in her left shoulder and heart racing. She took an aspirin without any improvement and called EMS. On arrival she was found to be in A. fib with rapid ventricular response at 180 bpm. She was given IV diltiazem and spontaneously converted to sinus rhythm however had a short recurrence of A. fib in the ER and then again converted back to sinus rhythm. This patients CHA2DS2-VASc Score and unadjusted Ischemic Stroke Rate (% per year) is equal to 2.2 % stroke rate/year from a score of 2. Above score calculated as 1 point each if present [CHF, HTN, DM, Vascular=MI/PAD/Aortic Plaque, Age if 65-74, or Female] Above score calculated as 2 points each if present [Age > 75, or Stroke/TIA/TE]  Mikenna returns today for follow-up. She underwent an exercise treadmill stress test which was negative for ischemia. Although the report indicated she did not reach target, I personally reviewed it and indicated her heart rate was greater than 85% max predicted heart rate. Therefore this was an adequate study and low risk. Her echocardiogram demonstrates normal LV function with an EF of 60-65%, there is trivial aortic insufficiency and it was read as grade 2 diastolic dysfunction. Is not clear whether she may eventually been in A. fib or not had recovery of atrial mechanical activity at the time therefore I question the degree of diastolic dysfunction. She denies any chest pain, or shortness of breath with exertion. Blood pressure is fairly well-controlled today. She is tolerating diltiazem which was a switch from verapamil which she was previously taking. She is also on Eliquis 5 mg twice a day without any bleeding problems. Heart rate is in the 60s today and  regular.  08/31/2016  Mrs. Saidi returns today for follow-up. Unfortunately she recently broke her left lower leg and underwent surgery to repair that. She is in a walking boot. She started some physical therapy for that. Fortunately she has not had any recurrent atrial fibrillation. She did discontinue hear few days prior to surgery and restart it afterwards. Heart rate and bloo pressure are well controlled.  PMHx:  Past Medical History:  Diagnosis Date  . A-fib (HCC)   . Anemia    during pneumonia  . Arthritis   . Asthma   . Hypertension   . Pneumonia     Past Surgical History:  Procedure Laterality Date  . c section     1973 and 1983  . COLONOSCOPY    . ORIF ANKLE FRACTURE Left 08/16/2016  . ORIF ANKLE FRACTURE Left 08/16/2016   Procedure: OPEN REDUCTION INTERNAL FIXATION (ORIF) LEFT BIMALLEOLAR ANKLE FRACTURE;  Surgeon: Kathryne Hitch, MD;  Location: [facility] OR;  Service: Orthopedics;  Laterality: Left;  . TUBAL LIGATION      FAMHx:  Family History  Problem Relation Age of Onset  . Atrial fibrillation Mother   . Hypertension Mother   . Asthma Father   . Heart attack Father     SOCHx:   reports that she has never smoked. She has never used smokeless tobacco. She reports that she does not drink alcohol or use drugs.  ALLERGIES:  Allergies  Allergen Reactions  . No Known Allergies     ROS: Pertinent items noted in HPI and remainder  of comprehensive ROS otherwise negative.  HOME MEDS: Current Outpatient Prescriptions  Medication Sig Dispense Refill  . acetaminophen (TYLENOL) 500 MG tablet Take 1,000 mg by mouth every 6 (six) hours as needed for mild pain.    Marland Kitchen. albuterol (PROVENTIL HFA;VENTOLIN HFA) 108 (90 Base) MCG/ACT inhaler Inhale 2 puffs into the lungs every 6 (six) hours as needed for wheezing or shortness of breath.     Marland Kitchen. apixaban (ELIQUIS) 5 MG TABS tablet Take 1 tablet (5 mg total) by mouth 2 (two) times daily. 60 tablet 5  . B Complex-C (B-COMPLEX  WITH VITAMIN C) tablet Take 1 tablet by mouth daily.    . busPIRone (BUSPAR) 5 MG tablet Take 5 mg by mouth 2 (two) times daily.     . cholecalciferol (VITAMIN D) 1000 units tablet Take 2,000 Units by mouth daily.    Marland Kitchen. diltiazem (CARDIZEM CD) 120 MG 24 hr capsule Take 1 capsule (120 mg total) by mouth daily. 30 capsule 5  . losartan-hydrochlorothiazide (HYZAAR) 100-25 MG tablet Take 1 tablet by mouth daily.    . Multiple Vitamin (MULTIVITAMIN WITH MINERALS) TABS tablet Take 1 tablet by mouth daily.    Marland Kitchen. oxyCODONE-acetaminophen (PERCOCET/ROXICET) 5-325 MG tablet Take 1-2 tablets by mouth every 4 (four) hours as needed for severe pain. 60 tablet 0   No current facility-administered medications for this visit.     LABS/IMAGING: No results found for this or any previous visit (from the past 48 hour(s)). No results found.  WEIGHTS: Wt Readings from Last 3 Encounters:  08/31/16 194 lb 3.2 oz (88.1 kg)  08/16/16 195 lb (88.5 kg)  08/05/16 195 lb (88.5 kg)    VITALS: BP 124/76   Pulse 76   Ht 5\' 4"  (1.626 m)   Wt 194 lb 3.2 oz (88.1 kg)   BMI 33.33 kg/m   EXAM: General appearance: alert and no distress Neck: no carotid bruit and no JVD Lungs: clear to auscultation bilaterally Heart: regular rate and rhythm, S1, S2 normal, no murmur, click, rub or gallop Abdomen: soft, non-tender; bowel sounds normal; no masses,  no organomegaly Extremities: left lower extremity in awalking boot Pulses: 2+ and symmetric Skin: Skin color, texture, turgor normal. No rashes or lesions Neurologic: Grossly normal Psych: Pleasant  EKG: NSR at 75, low voltage QRS  ASSESSMENT: 1. PAF-CHADSVASC score of 2 on Eliquis 2. Hypertension-controlled 3. Long-term use of anticoagulants  PLAN: 1.   Mrs. Daniel NonesGouge Is doing well without any chest pain or worsening shortness of breath. Blood pressure is at goal today. She's not had any recurrent atrial fibrillation and is tolerating Eliquis without any bleeding  problems. Plan to see her back annually or sooner as necessary.  Chrystie NoseKenneth C. Quentin Strebel, MD, [facility] Attending Cardiologist [facility]  Chrystie NoseKenneth C Zayanna Pundt 08/31/2016, 8:50 AM

## 2016-08-31 NOTE — Patient Instructions (Signed)

## 2016-09-03 ENCOUNTER — Other Ambulatory Visit: Payer: Self-pay | Admitting: Internal Medicine

## 2016-09-04 NOTE — Telephone Encounter (Signed)
NEw message  Pt call requesting to speak with RN about med refills. Pt states the pharmacy refilled losartan and pt states her PCP prescribed the med. Pt states [name] prescribed pt Eliquis and Cardizem. Pt would like to speak with RN about getting a refill for the cardizem. Please call back to discuss

## 2016-09-27 ENCOUNTER — Ambulatory Visit (INDEPENDENT_AMBULATORY_CARE_PROVIDER_SITE_OTHER): Payer: BLUE CROSS/BLUE SHIELD

## 2016-09-27 ENCOUNTER — Ambulatory Visit (INDEPENDENT_AMBULATORY_CARE_PROVIDER_SITE_OTHER): Payer: BLUE CROSS/BLUE SHIELD | Admitting: Orthopaedic Surgery

## 2016-09-27 DIAGNOSIS — S82842D Displaced bimalleolar fracture of left lower leg, subsequent encounter for closed fracture with routine healing: Secondary | ICD-10-CM

## 2016-09-27 NOTE — Progress Notes (Signed)
Office Visit Note   Patient: [name] [name]           Date of Birth: December 12, 1950           MRN: [id number] Visit Date: 09/27/2016              Requested by: Mirna MiresGerald Hill, MD [address] [place], [place] [address] PCP: Evlyn CourierGerald K Hill, MD   Assessment & Plan: Visit Diagnoses:  1. Closed bimalleolar fracture of left ankle with routine healing, subsequent encounter     Plan: At this point I [facility] [place]. They will work on balance coordination and gait training. She can weight-bear as tolerated in her walking boot and they'll work on range of motion of her ankle. I like to see her back in 4 weeks with a repeat feet 3 views of her left ankle. At that point we'll transition her to a lace up ankle support. I'll allow her to drive at this standpoint. We will keep her out of work the next 4 weeks as well.  Follow-Up Instructions: Return in about 4 weeks (around 10/25/2016).   Orders:  Orders Placed This Encounter  Procedures  . XR Ankle Complete Left   No orders of the defined types were placed in this encounter.     Procedures: No procedures performed   Clinical Data: No additional findings.   Subjective: Chief Complaint  Patient presents with  . Left Ankle - Follow-up    Doing ok. Getting better ROM. Wearing boot today in wheelchair but states she is walking "lightly"at home with walker.     HPI  Review of Systems   Objective: Vital Signs: There were no vitals taken for this visit.  Physical Exam  Ortho Exam Her left ankle range of motion is improving. She has a scab on the front of her ankle that I unroofed. Her incisions are well-healed. Her ankle feels stable on exam. Specialty Comments:  No specialty comments available.  Imaging: Xr Ankle Complete Left  Result Date: 09/27/2016 3 views of her left ankle show interval healing of her fracture. The ankle mortise is intact.  There is no, getting features of the hardware.    PMFS History: Patient Active Problem List   Diagnosis Date Noted  . Closed displaced bimalleolar fracture of left ankle 08/16/2016  . Long term current use of anticoagulant 03/05/2016  . PAF (paroxysmal atrial fibrillation) (HCC) 02/11/2016  . Essential hypertension 02/11/2016  . Asthma 02/11/2016   Past Medical History:  Diagnosis Date  . A-fib (HCC)   . Anemia    during pneumonia  . Arthritis   . Asthma   . Hypertension   . Pneumonia     Family History  Problem Relation Age of Onset  . Atrial fibrillation Mother   . Hypertension Mother   . Asthma Father   . Heart attack Father     Past Surgical History:  Procedure Laterality Date  . c section     1973 and 1983  . COLONOSCOPY    . ORIF ANKLE FRACTURE Left 08/16/2016  . ORIF ANKLE FRACTURE Left 08/16/2016   Procedure: OPEN REDUCTION INTERNAL FIXATION (ORIF) LEFT BIMALLEOLAR ANKLE FRACTURE;  Surgeon: Kathryne Hitchhristopher Y Lauree Yurick, MD;  Location: [facility] OR;  Service: Orthopedics;  Laterality: Left;  . TUBAL LIGATION     Social History   Occupational History  . Not on file.   Social History  Main Topics  . Smoking status: Never Smoker  . Smokeless tobacco: Never Used  . Alcohol use No  . Drug use: No  . Sexual activity: Not on file

## 2016-10-02 ENCOUNTER — Ambulatory Visit: Payer: BLUE CROSS/BLUE SHIELD | Attending: Orthopaedic Surgery | Admitting: Physical Therapy

## 2016-10-02 DIAGNOSIS — M25672 Stiffness of left ankle, not elsewhere classified: Secondary | ICD-10-CM | POA: Insufficient documentation

## 2016-10-02 DIAGNOSIS — R600 Localized edema: Secondary | ICD-10-CM | POA: Diagnosis present

## 2016-10-02 DIAGNOSIS — M25572 Pain in left ankle and joints of left foot: Secondary | ICD-10-CM | POA: Insufficient documentation

## 2016-10-02 NOTE — Therapy (Signed)
[facility] [address] [place], [place], [address] Phone: [phone]   Fax:  [phone]  Physical Therapy Evaluation  Patient Details  Name: [name] [name] MRN: [id number] Date of Birth: 12-03-1950 Referring Provider: Doneen Poissonhristopher Blackman MD.  Encounter Date: 10/02/2016      PT End of Session - 10/02/16 1630    Visit Number 1   Number of Visits 16   Date for PT Re-Evaluation 12/01/16   PT Start Time 0230   PT Stop Time 0317   PT Time Calculation (min) 47 min   Activity Tolerance Patient tolerated treatment well      Past Medical History:  Diagnosis Date  . A-fib (HCC)   . Anemia    during pneumonia  . Arthritis   . Asthma   . Hypertension   . Pneumonia     Past Surgical History:  Procedure Laterality Date  . c section     1973 and 1983  . COLONOSCOPY    . ORIF ANKLE FRACTURE Left 08/16/2016  . ORIF ANKLE FRACTURE Left 08/16/2016   Procedure: OPEN REDUCTION INTERNAL FIXATION (ORIF) LEFT BIMALLEOLAR ANKLE FRACTURE;  Surgeon: Kathryne Hitchhristopher Y Blackman, MD;  Location: [facility] OR;  Service: Orthopedics;  Laterality: Left;  . TUBAL LIGATION      There were no vitals filed for this visit.       Subjective Assessment - 10/02/16 1456    Subjective The patient fell on September 10th and fractured her left ankle.  She underwent a left ankle ORIF (op report says trimalleolar fracture) on 08/16/16.  She presents to the clinic today in a CAM and a standard walker.  She is wbat but elected to non-weight bear today.  Her pain-level is a 4/10 today.  Pain decreases with ice and elevation.  Sitting upright with foot on floor increases pain.   Patient Stated Goals Get back to normal.            [facility] PT Assessment - 10/02/16 0001      Assessment   Medical Diagnosis Lt ankle bimalleolr fracture.   Referring Provider Doneen Poissonhristopher Blackman MD.   Onset Date/Surgical Date --  08/05/16 (date of fall).     Precautions   Precaution  Comments In left CAM boot WBAT.     Restrictions   Weight Bearing Restrictions --  WBAT over left LE in CAM boot.     Balance Screen   Has the patient fallen in the past 6 months Yes   How many times? --  1.   Has the patient had a decrease in activity level because of a fear of falling?  Yes   Is the patient reluctant to leave their home because of a fear of falling?  Yes     Home Environment   Living Environment Private residence     Prior Function   Level of Independence Independent     Observation/Other Assessments   Observations Left dorsum of ankle with scabbed area and medial and lateral incisional sites are healing well.     Observation/Other Assessments-Edema    Edema --  Moderate- left ankle edema.     ROM / Strength   AROM / PROM / Strength AROM     AROM   Overall AROM Comments Left active ankle dorsiflexion= 0 degrees; plantarflexion= 40 degrees; inversion= 19 degrees and eversion= 0 degrees.     Palpation   Palpation comment C/o numbness over dorsum of left foot and mild incisional site tenderness.  Ambulation/Gait   Gait Comments Patient walking in clinic with a SW and left CAM.  She is to be wbat but was NWBing today.                   [facility] PT Treatment/Exercise - 10/02/16 0001      Modalities   Modalities Electrical Stimulation;Vasopneumatic     Electrical Stimulation   Electrical Stimulation Location LT ankle.   Electrical Stimulation Action IFC   Electrical Stimulation Parameters 1-10 HZ x 15 minutes.   Electrical Stimulation Goals Edema;Pain     Vasopneumatic   Number Minutes Vasopneumatic  15 minutes   Vasopnuematic Location  --  LT ankle.   Vasopneumatic Pressure Medium                     PT Long Term Goals - 10/02/16 1638      PT LONG TERM GOAL #1   Title Independent with a HEP.   Time 8   Period Weeks   Status New     PT LONG TERM GOAL #2   Title Increase left ankle dorsiflexion to 6- 8  degrees to normalize the patient's gait pattern   Time 8   Period Weeks   Status New     PT LONG TERM GOAL #3   Title Increase left ankle strength to 4+ to 5/5 to increase stability for functional tasks   Time 8   Period Weeks   Status New     PT LONG TERM GOAL #4   Title Walk in clinic without assistive device 500 feet without deviation.   Time 8   Period Weeks   Status New     PT LONG TERM GOAL #5   Title Perform ADL's with pain not > 3/10.   Time 8   Period Weeks   Status New               Plan - 10/02/16 1631    Clinical Impression Statement The patient presents with numbess over the dorsum of her left foot.  She is lacking left active range of motion into dorsiflexion and eversion especially.  She is currently in a CAM walker/boot and is using a SW for safe ambulation.  Her edema is of a moderate- level.   Rehab Potential Excellent   PT Frequency 2x / week   PT Duration 8 weeks   PT Treatment/Interventions ADLs/Self Care Home Management;Cryotherapy;Electrical Stimulation;Moist Heat;Therapeutic activities;Therapeutic exercise;Neuromuscular re-education;Patient/family education;Passive range of motion;Manual techniques;Vasopneumatic Device   PT Next Visit Plan Seated Rockeraboarad and BAPS board; left ankle PROM; e'stim and vasopneumatic.   Consulted and Agree with Plan of Care Patient      Patient will benefit from skilled therapeutic intervention in order to improve the following deficits and impairments:  Pain, Decreased activity tolerance, Increased edema, Decreased range of motion, Decreased mobility  Visit Diagnosis: Pain in left ankle and joints of left foot - Plan: PT plan of care cert/re-cert  Stiffness of left ankle, not elsewhere classified - Plan: PT plan of care cert/re-cert  Localized edema - Plan: PT plan of care cert/re-cert      G-Codes - 10/02/16 1455    Functional Assessment Tool Used Clinical judgement...   Functional Limitation Mobility:  Walking and moving around   Mobility: Walking and Moving Around Current Status [phone](G8978) At least 40 percent but less than 60 percent impaired, limited or restricted   Mobility: Walking and Moving Around Goal Status ([id number]([id number]) At least  1 percent but less than 20 percent impaired, limited or restricted       Problem List Patient Active Problem List   Diagnosis Date Noted  . Closed displaced bimalleolar fracture of left ankle 08/16/2016  . Long term current use of anticoagulant 03/05/2016  . PAF (paroxysmal atrial fibrillation) (HCC) 02/11/2016  . Essential hypertension 02/11/2016  . Asthma 02/11/2016    Calyx Hawker, [place] MPT 10/02/2016, 4:43 PM  [facility] [address] [place], [place], [address] Phone: [phone]   Fax:  [phone]  Name: [name] [name] MRN: [id number] Date of Birth: Jun 12, 1951

## 2016-10-05 ENCOUNTER — Encounter: Payer: Self-pay | Admitting: Physical Therapy

## 2016-10-05 ENCOUNTER — Ambulatory Visit: Payer: BLUE CROSS/BLUE SHIELD | Admitting: Physical Therapy

## 2016-10-05 DIAGNOSIS — R600 Localized edema: Secondary | ICD-10-CM

## 2016-10-05 DIAGNOSIS — M25572 Pain in left ankle and joints of left foot: Secondary | ICD-10-CM

## 2016-10-05 DIAGNOSIS — M25672 Stiffness of left ankle, not elsewhere classified: Secondary | ICD-10-CM

## 2016-10-05 NOTE — Therapy (Signed)
[facility] [address] [place], [place], [address] Phone: [phone]   Fax:  [phone]  Physical Therapy Treatment  Patient Details  Name: [name] [name] MRN: [id number] Date of Birth: 1951/02/27 Referring Provider: Doneen Poissonhristopher Blackman MD.  Encounter Date: 10/05/2016      PT End of Session - 10/05/16 1036    Visit Number 2   Number of Visits 16   Date for PT Re-Evaluation 12/01/16   PT Start Time 1033   PT Stop Time 1117   PT Time Calculation (min) 44 min   Activity Tolerance Patient tolerated treatment well   Behavior During Therapy [facility] for tasks assessed/performed      Past Medical History:  Diagnosis Date  . A-fib (HCC)   . Anemia    during pneumonia  . Arthritis   . Asthma   . Hypertension   . Pneumonia     Past Surgical History:  Procedure Laterality Date  . c section     1973 and 1983  . COLONOSCOPY    . ORIF ANKLE FRACTURE Left 08/16/2016  . ORIF ANKLE FRACTURE Left 08/16/2016   Procedure: OPEN REDUCTION INTERNAL FIXATION (ORIF) LEFT BIMALLEOLAR ANKLE FRACTURE;  Surgeon: Kathryne Hitchhristopher Y Blackman, MD;  Location: [facility] OR;  Service: Orthopedics;  Laterality: Left;  . TUBAL LIGATION      There were no vitals filed for this visit.      Subjective Assessment - 10/05/16 1034    Subjective Reports that she still has some numbness and tingling in L ankle.    Patient Stated Goals Get back to normal.   Currently in Pain? Yes   Pain Score 5    Pain Location Ankle   Pain Orientation Left   Pain Descriptors / Indicators Numbness;Tightness   Pain Type Surgical pain            [facility] PT Assessment - 10/05/16 0001      Assessment   Medical Diagnosis Lt ankle bimalleolr fracture.   Onset Date/Surgical Date 08/05/16   Next MD Visit 10/25/2016     Precautions   Precaution Comments In left CAM boot WBAT.                     [facility] PT Treatment/Exercise - 10/05/16 0001      Exercises    Exercises Ankle;Knee/Hip     Knee/Hip Exercises: Seated   Long Arc Quad Strengthening;Left;2 sets;10 reps;Weights   Long Arc Quad Weight 3 lbs.   Clamshell with TheraBand Green  2x10 reps     Knee/Hip Exercises: Supine   Straight Leg Raises AROM;Left;2 sets;10 reps     Modalities   Modalities Proofreaderlectrical Stimulation;Vasopneumatic     Electrical Stimulation   Electrical Stimulation Location L ankle   Electrical Stimulation Action IFC   Electrical Stimulation Parameters 1-10 hz x15 min   Electrical Stimulation Goals Edema;Pain     Vasopneumatic   Number Minutes Vasopneumatic  15 minutes   Vasopnuematic Location  Knee   Vasopneumatic Pressure Medium   Vasopneumatic Temperature  61     Ankle Exercises: Seated   ABC's 1 rep   Ankle Circles/Pumps AROM;Left;20 reps   Heel Raises 20 reps   Toe Raise 20 reps   Other Seated Ankle Exercises Seated L ankle rockerboard x6 min   Other Seated Ankle Exercises Seated L ankle dynadisc DF/PF, Inv/Ev x5 min total  PT Long Term Goals - 10/02/16 1638      PT LONG TERM GOAL #1   Title Independent with a HEP.   Time 8   Period Weeks   Status New     PT LONG TERM GOAL #2   Title Increase left ankle dorsiflexion to 6- 8 degrees to normalize the patient's gait pattern   Time 8   Period Weeks   Status New     PT LONG TERM GOAL #3   Title Increase left ankle strength to 4+ to 5/5 to increase stability for functional tasks   Time 8   Period Weeks   Status New     PT LONG TERM GOAL #4   Title Walk in clinic without assistive device 500 feet without deviation.   Time 8   Period Weeks   Status New     PT LONG TERM GOAL #5   Title Perform ADL's with pain not > 3/10.   Time 8   Period Weeks   Status New               Plan - 10/05/16 1110    Clinical Impression Statement Patient continues to present in clinic with reports of numbness and tightness of the L ankle. Patient encouraged to continue  ankle pumps at times of increased tightness secondary to edema present in L ankle. Patient continues to use CAM boot and FWW for ambulation at this time. Patient able to tolerate L ankle ROM and LLE strengthening exercises well with only minimal to moderate multimodal cueing for proper exercise technique. Normal modalities response noted following removal of the modalities.   Rehab Potential Excellent   PT Frequency 2x / week   PT Duration 8 weeks   PT Treatment/Interventions ADLs/Self Care Home Management;Cryotherapy;Electrical Stimulation;Moist Heat;Therapeutic activities;Therapeutic exercise;Neuromuscular re-education;Patient/family education;Passive range of motion;Manual techniques;Vasopneumatic Device   PT Next Visit Plan Continue seated L ankle activities and LLE strengthening with modalities as symptoms dictate per MPT POC.   Consulted and Agree with Plan of Care Patient      Patient will benefit from skilled therapeutic intervention in order to improve the following deficits and impairments:  Pain, Decreased activity tolerance, Increased edema, Decreased range of motion, Decreased mobility  Visit Diagnosis: Pain in left ankle and joints of left foot  Stiffness of left ankle, not elsewhere classified  Localized edema     Problem List Patient Active Problem List   Diagnosis Date Noted  . Closed displaced bimalleolar fracture of left ankle 08/16/2016  . Long term current use of anticoagulant 03/05/2016  . PAF (paroxysmal atrial fibrillation) (HCC) 02/11/2016  . Essential hypertension 02/11/2016  . Asthma 02/11/2016    Evelene CroonKelsey M Parsons, PTA 10/05/2016, 11:58 AM  [facility] [address] [place], [place], [address] Phone: [phone]   Fax:  [phone]  Name: [name] [name] MRN: [id number] Date of Birth: 01-24-1951

## 2016-10-08 ENCOUNTER — Ambulatory Visit: Payer: BLUE CROSS/BLUE SHIELD | Admitting: Physical Therapy

## 2016-10-08 DIAGNOSIS — M25572 Pain in left ankle and joints of left foot: Secondary | ICD-10-CM

## 2016-10-08 DIAGNOSIS — R600 Localized edema: Secondary | ICD-10-CM

## 2016-10-08 DIAGNOSIS — M25672 Stiffness of left ankle, not elsewhere classified: Secondary | ICD-10-CM

## 2016-10-08 NOTE — Therapy (Signed)
[facility] [address] [place], [place], [address] Phone: [phone]   Fax:  [phone]  Physical Therapy Treatment  Patient Details  Name: [name] RobinsonsRuth A Javier MRN: [id number] Date of Birth: 21-Jan-1951 Referring Provider: Doneen Poissonhristopher Blackman MD.  Encounter Date: 10/08/2016      PT End of Session - 10/08/16 1508    Visit Number 3   Number of Visits 16   Date for PT Re-Evaluation 12/01/16   PT Start Time 0230   PT Stop Time 0323   PT Time Calculation (min) 53 min   Activity Tolerance Patient tolerated treatment well   Behavior During Therapy [facility] for tasks assessed/performed      Past Medical History:  Diagnosis Date  . A-fib (HCC)   . Anemia    during pneumonia  . Arthritis   . Asthma   . Hypertension   . Pneumonia     Past Surgical History:  Procedure Laterality Date  . c section     1973 and 1983  . COLONOSCOPY    . ORIF ANKLE FRACTURE Left 08/16/2016  . ORIF ANKLE FRACTURE Left 08/16/2016   Procedure: OPEN REDUCTION INTERNAL FIXATION (ORIF) LEFT BIMALLEOLAR ANKLE FRACTURE;  Surgeon: Kathryne Hitchhristopher Y Blackman, MD;  Location: [facility];  Service: Orthopedics;  Laterality: Left;  . TUBAL LIGATION      There were no vitals filed for this visit.      Subjective Assessment - 10/08/16 1514    Subjective I'm doing those ABC's at home.   Patient Stated Goals Get back to normal.   Pain Score 5    Pain Location Ankle   Pain Orientation Left   Pain Descriptors / Indicators Numbness;Tightness   Pain Type Surgical pain                         [facility] Adult PT Treatment/Exercise - 10/08/16 0001      Electrical Stimulation   Electrical Stimulation Location LT med/lat ankle   Electrical Stimulation Action Pre-mod   Electrical Stimulation Parameters 80-150 Hz x 15 minutes.   Electrical Stimulation Goals Pain     Vasopneumatic   Number Minutes Vasopneumatic  15 minutes   Vasopnuematic Location  --  Left ankle.    Vasopneumatic Pressure Medium     Manual Therapy   Manual Therapy Soft tissue mobilization;Passive ROM   Passive ROM PROM to patient's left ankle in all direction including STW/M/scar mobility x 23 minutes.                     PT Long Term Goals - 10/02/16 1638      PT LONG TERM GOAL #1   Title Independent with a HEP.   Time 8   Period Weeks   Status New     PT LONG TERM GOAL #2   Title Increase left ankle dorsiflexion to 6- 8 degrees to normalize the patient's gait pattern   Time 8   Period Weeks   Status New     PT LONG TERM GOAL #3   Title Increase left ankle strength to 4+ to 5/5 to increase stability for functional tasks   Time 8   Period Weeks   Status New     PT LONG TERM GOAL #4   Title Walk in clinic without assistive device 500 feet without deviation.   Time 8   Period Weeks   Status New     PT LONG TERM GOAL #5  Title Perform ADL's with pain not > 3/10.   Time 8   Period Weeks   Status New               Plan - 10/08/16 1509    Clinical Impression Statement Patient appears to have a small stitch just above her lateral malleolus.  There was no redness or drainage but nonetheless I told her to keep an eye on it and to call her doctor at the first sign of redness or drainage.        Patient will benefit from skilled therapeutic intervention in order to improve the following deficits and impairments:  Pain, Decreased activity tolerance, Increased edema, Decreased range of motion, Decreased mobility  Visit Diagnosis: Pain in left ankle and joints of left foot  Stiffness of left ankle, not elsewhere classified  Localized edema     Problem List Patient Active Problem List   Diagnosis Date Noted  . Closed displaced bimalleolar fracture of left ankle 08/16/2016  . Long term current use of anticoagulant 03/05/2016  . PAF (paroxysmal atrial fibrillation) (HCC) 02/11/2016  . Essential hypertension 02/11/2016  . Asthma 02/11/2016     Dionysios Massman, [place] MPT 10/08/2016, 3:29 PM  [facility] [address] [place], [place], [address] Phone: [phone]   Fax:  [phone]  Name: [name] RobinsonsRuth A Ricardo MRN: [id number] Date of Birth: [birth year]

## 2016-10-12 ENCOUNTER — Ambulatory Visit: Payer: BLUE CROSS/BLUE SHIELD | Admitting: Physical Therapy

## 2016-10-12 DIAGNOSIS — M25672 Stiffness of left ankle, not elsewhere classified: Secondary | ICD-10-CM

## 2016-10-12 DIAGNOSIS — M25572 Pain in left ankle and joints of left foot: Secondary | ICD-10-CM

## 2016-10-12 DIAGNOSIS — R600 Localized edema: Secondary | ICD-10-CM

## 2016-10-12 NOTE — Therapy (Signed)
[facility] [address] [place], [place], [address] Phone: [phone]   Fax:  [phone]  Physical Therapy Treatment  Patient Details  Name: [name] RobinsonsRuth A Massmann MRN: [id number] Date of Birth: May 31, 1951 Referring Provider: Doneen Poissonhristopher Blackman MD.  Encounter Date: 10/12/2016      PT End of Session - 10/12/16 1226    Visit Number 4   Number of Visits 16   PT Start Time 1030   PT Stop Time 1119   PT Time Calculation (min) 49 min      Past Medical History:  Diagnosis Date  . A-fib (HCC)   . Anemia    during pneumonia  . Arthritis   . Asthma   . Hypertension   . Pneumonia     Past Surgical History:  Procedure Laterality Date  . c section     1973 and 1983  . COLONOSCOPY    . ORIF ANKLE FRACTURE Left 08/16/2016  . ORIF ANKLE FRACTURE Left 08/16/2016   Procedure: OPEN REDUCTION INTERNAL FIXATION (ORIF) LEFT BIMALLEOLAR ANKLE FRACTURE;  Surgeon: Kathryne Hitchhristopher Y Blackman, MD;  Location: [facility];  Service: Orthopedics;  Laterality: Left;  . TUBAL LIGATION      There were no vitals filed for this visit.      Subjective Assessment - 10/12/16 1234    Subjective No new complaints.   Patient Stated Goals Get back to normal.   Currently in Pain? Yes   Pain Score 4    Pain Location Ankle   Pain Orientation Left   Pain Descriptors / Indicators Numbness;Tightness   Pain Type Surgical pain            [facility] PT Assessment - 10/12/16 0001      AROM   Overall AROM Comments 6 degrees of left ankle dorsiflexion.                     [facility]/Exercise - 10/12/16 0001      Exercises   Exercises Ankle     Electrical Stimulation   Electrical Stimulation Location LT LAT/MED ankle.   Electrical Stimulation Action PRE-MOD   Electrical Stimulation Parameters 80-150 HZ x 15 minutes.   Electrical Stimulation Goals Pain     Vasopneumatic   Number Minutes Vasopneumatic  15 minutes   Vasopnuematic Location   --  LT ANKLE.     Manual Therapy   Passive ROM PROM to patient left ankle x 7 minutes.     Ankle Exercises: Seated   Other Seated Ankle Exercises Seated rockerboard x 6 minutes into DF/PF/INV/EVER   Other Seated Ankle Exercises Dynadisc x 6 minutes all directions.     Ankle Exercises: Supine   Other Supine Ankle Exercises Yellow theraband exercise into DF/PF/INV/EVER x 4 minutes.                     PT Long Term Goals - 10/02/16 1638      PT LONG TERM GOAL #1   Title Independent with a HEP.   Time 8   Period Weeks   Status New     PT LONG TERM GOAL #2   Title Increase left ankle dorsiflexion to 6- 8 degrees to normalize the patient's gait pattern   Time 8   Period Weeks   Status New     PT LONG TERM GOAL #3   Title Increase left ankle strength to 4+ to 5/5 to increase stability for functional tasks   Time  8   Period Weeks   Status New     PT LONG TERM GOAL #4   Title Walk in clinic without assistive device 500 feet without deviation.   Time 8   Period Weeks   Status New     PT LONG TERM GOAL #5   Title Perform ADL's with pain not > 3/10.   Time 8   Period Weeks   Status New               Plan - 10/12/16 1246    Clinical Impression Statement Good progress with regards to increasing active left ankle dorsiflexion.      Patient will benefit from skilled therapeutic intervention in order to improve the following deficits and impairments:  Pain, Decreased activity tolerance, Increased edema, Decreased range of motion, Decreased mobility  Visit Diagnosis: Pain in left ankle and joints of left foot  Stiffness of left ankle, not elsewhere classified  Localized edema     Problem List Patient Active Problem List   Diagnosis Date Noted  . Closed displaced bimalleolar fracture of left ankle 08/16/2016  . Long term current use of anticoagulant 03/05/2016  . PAF (paroxysmal atrial fibrillation) (HCC) 02/11/2016  . Essential hypertension  02/11/2016  . Asthma 02/11/2016    Kimmy Parish, [place] MPT 10/12/2016, 12:48 PM  [facility] [address] [place], [place], [address] Phone: [phone]   Fax:  [phone]  Name: [name] RobinsonsRuth A Cimo MRN: [id number] Date of Birth: 1951-07-20

## 2016-10-15 ENCOUNTER — Ambulatory Visit: Payer: BLUE CROSS/BLUE SHIELD | Admitting: Physical Therapy

## 2016-10-15 ENCOUNTER — Encounter: Payer: Self-pay | Admitting: Physical Therapy

## 2016-10-15 DIAGNOSIS — M25572 Pain in left ankle and joints of left foot: Secondary | ICD-10-CM | POA: Diagnosis not present

## 2016-10-15 DIAGNOSIS — M25672 Stiffness of left ankle, not elsewhere classified: Secondary | ICD-10-CM

## 2016-10-15 DIAGNOSIS — R600 Localized edema: Secondary | ICD-10-CM

## 2016-10-15 NOTE — Therapy (Signed)
[facility] [address] [place], [place], [address] Phone: [phone]   Fax:  [phone]  Physical Therapy Treatment  Patient Details  Name: [name] RobinsonsRuth A Tiger MRN: [id number] Date of Birth: 1951-06-26 Referring Provider: Doneen Poissonhristopher Blackman MD.  Encounter Date: 10/15/2016      PT End of Session - 10/15/16 1105    Visit Number 5   Number of Visits 16   Date for PT Re-Evaluation 12/01/16   PT Start Time 1031   PT Stop Time 1126   PT Time Calculation (min) 55 min   Activity Tolerance Patient tolerated treatment well   Behavior During Therapy [facility] for tasks assessed/performed      Past Medical History:  Diagnosis Date  . A-fib (HCC)   . Anemia    during pneumonia  . Arthritis   . Asthma   . Hypertension   . Pneumonia     Past Surgical History:  Procedure Laterality Date  . c section     1973 and 1983  . COLONOSCOPY    . ORIF ANKLE FRACTURE Left 08/16/2016  . ORIF ANKLE FRACTURE Left 08/16/2016   Procedure: OPEN REDUCTION INTERNAL FIXATION (ORIF) LEFT BIMALLEOLAR ANKLE FRACTURE;  Surgeon: Kathryne Hitchhristopher Y Blackman, MD;  Location[facility];  Service: Orthopedics;  Laterality: Left;  . TUBAL LIGATION      There were no vitals filed for this visit.      Subjective Assessment - 10/15/16 1039    Subjective Patient had some numbess complaints in left foot   Patient Stated Goals Get back to normal.   Currently in Pain? Yes   Pain Score 4    Pain Location Ankle   Pain Orientation Left   Pain Descriptors / Indicators Tightness;Numbness   Pain Type Surgical pain   Aggravating Factors  no movement   Pain Relieving Factors movement                         [facility] Adult PT Treatment/Exercise - 10/15/16 0001      Electrical Stimulation   Electrical Stimulation Location LT LAT/MED ankle.   Engineer, manufacturinglectrical Stimulation Action premod   Electrical Stimulation Parameters 1-10hx x7715min   Electrical Stimulation Goals Pain      Vasopneumatic   Number Minutes Vasopneumatic  15 minutes   Vasopnuematic Location  Ankle   Vasopneumatic Pressure Medium     Manual Therapy   Passive ROM PROM to left ankle for all motions with gentle range     Ankle Exercises: Seated   Other Seated Ankle Exercises Seated rockerboard x 6 minutes into DF/PF/INV/EVER   Other Seated Ankle Exercises Dynadisc x 6 minutes all directions.     Ankle Exercises: Supine   Other Supine Ankle Exercises Yellow theraband exercise into DF/PF/INV/EVER 2x10each                     PT Long Term Goals - 10/15/16 1106      PT LONG TERM GOAL #1   Title Independent with a HEP.   Time 8   Status On-going     PT LONG TERM GOAL #2   Title Increase left ankle dorsiflexion to 6- 8 degrees to normalize the patient's gait pattern   Period Weeks   Status On-going     PT LONG TERM GOAL #3   Title Increase left ankle strength to 4+ to 5/5 to increase stability for functional tasks   Time 8   Period Weeks  Status On-going     PT LONG TERM GOAL #4   Title Walk in clinic without assistive device 500 feet without deviation.   Time 8   Period Weeks   Status On-going     PT LONG TERM GOAL #5   Title Perform ADL's with pain not > 3/10.   Time 8   Period Weeks   Status On-going               Plan - 10/15/16 1107    Clinical Impression Statement Patient progressing overall with no complaints during treatments and feels less symptoms during activities. Patient currently continues to wear CAM boot per MD and performs exerciss per instructed. Patient tolerated treatment well today. Patient current goals progressing yet ongoing due to ROM, strength and pain deficts.    Rehab Potential Excellent   PT Frequency 2x / week   PT Duration 8 weeks   PT Treatment/Interventions ADLs/Self Care Home Management;Cryotherapy;Electrical Stimulation;Moist Heat;Therapeutic activities;Therapeutic exercise;Neuromuscular re-education;Patient/family  education;Passive range of motion;Manual techniques;Vasopneumatic Device   PT Next Visit Plan Continue seated L ankle activities and LLE strengthening with modalities as symptoms dictate per MPT POC. (MD. Magnus IvanBlackman 10/25/16)   Consulted and Agree with Plan of Care Patient      Patient will benefit from skilled therapeutic intervention in order to improve the following deficits and impairments:  Pain, Decreased activity tolerance, Increased edema, Decreased range of motion, Decreased mobility  Visit Diagnosis: Pain in left ankle and joints of left foot  Stiffness of left ankle, not elsewhere classified  Localized edema     Problem List Patient Active Problem List   Diagnosis Date Noted  . Closed displaced bimalleolar fracture of left ankle 08/16/2016  . Long term current use of anticoagulant 03/05/2016  . PAF (paroxysmal atrial fibrillation) (HCC) 02/11/2016  . Essential hypertension 02/11/2016  . Asthma 02/11/2016    Hermelinda DellenDUNFORD, Akayla Brass P, PTA 10/15/2016, 11:26 AM  [facility] [address] [place], [place], [address] Phone: [phone]   Fax:  [phone]  Name: [name] RobinsonsRuth A [name] MRN: [id number] Date of Birth: [birth year]

## 2016-10-17 ENCOUNTER — Encounter: Payer: Self-pay | Admitting: Physical Therapy

## 2016-10-17 ENCOUNTER — Ambulatory Visit: Payer: BLUE CROSS/BLUE SHIELD | Admitting: Physical Therapy

## 2016-10-17 DIAGNOSIS — M25572 Pain in left ankle and joints of left foot: Secondary | ICD-10-CM

## 2016-10-17 DIAGNOSIS — M25672 Stiffness of left ankle, not elsewhere classified: Secondary | ICD-10-CM

## 2016-10-17 DIAGNOSIS — R600 Localized edema: Secondary | ICD-10-CM

## 2016-10-17 NOTE — Therapy (Signed)
[facility] [place], [place], [address] Phone: [phone]   Fax:  [phone]  Physical Therapy Treatment  Patient Details  Name: [name] [name] MRN: [id number] Date of Birth: August 29, 1951 Referring Provider: Jean Rosenthal MD.  Encounter Date: 10/17/2016      PT End of Session - 10/17/16 1157    Visit Number 6   Number of Visits 16   Date for PT Re-Evaluation 12/01/16   PT Start Time 1115   PT Stop Time 1204   PT Time Calculation (min) 49 min   Activity Tolerance Patient tolerated treatment well   Behavior During Therapy [facility] for tasks assessed/performed      Past Medical History:  Diagnosis Date  . A-fib ([place])   . Anemia    during pneumonia  . Arthritis   . Asthma   . Hypertension   . Pneumonia     Past Surgical History:  Procedure Laterality Date  . c section     1973 and 1983  . COLONOSCOPY    . ORIF ANKLE FRACTURE Left 08/16/2016  . ORIF ANKLE FRACTURE Left 08/16/2016   Procedure: OPEN REDUCTION INTERNAL FIXATION (ORIF) LEFT BIMALLEOLAR ANKLE FRACTURE;  Surgeon: Mcarthur Rossetti, MD;  Location: [facility];  Service: Orthopedics;  Laterality: Left;  . TUBAL LIGATION      There were no vitals filed for this visit.      Subjective Assessment - 10/17/16 1126    Subjective Patient has reported ongoing numbess complaints in left foot   Patient Stated Goals Get back to normal.   Currently in Pain? Yes   Pain Score 4    Pain Location Ankle   Pain Orientation Left   Pain Descriptors / Indicators Tightness;Numbness   Pain Type Surgical pain   Aggravating Factors  no movement   Pain Relieving Factors movement                         [facility] Adult PT Treatment/Exercise - 10/17/16 0001      Electrical Stimulation   Electrical Stimulation Location LT LAT/MED ankle.   Electrical Stimulation Action premod   Printmaker Parameters 1-_0  x4mn     Vasopneumatic   Number  Minutes Vasopneumatic  15 minutes   Vasopnuematic Location  Ankle   Vasopneumatic Pressure Medium     Manual Therapy   Passive ROM PROM to left ankle for all motions with gentle range     Ankle Exercises: Seated   Other Seated Ankle Exercises Seated rockerboard x 6 minutes into DF/PF/INV/EVER   Other Seated Ankle Exercises Dynadisc x 6 minutes all directions.     Ankle Exercises: Supine   Other Supine Ankle Exercises Yellow theraband exercise into DF/PF/INV/EVER 2x10each                     PT Long Term Goals - 10/17/16 1141      PT LONG TERM GOAL #1   Title Independent with a HEP.   Time 8   Period Weeks   Status On-going     PT LONG TERM GOAL #2   Title Increase left ankle dorsiflexion to 6- 8 degrees to normalize the patient's gait pattern   Time 8   Period Weeks   Status Achieved  AROM 10 degrees DF 10/17/16     PT LONG TERM GOAL #3   Title Increase left ankle strength to 4+ to 5/5 to increase stability for functional tasks  Time 8   Period Weeks     PT LONG TERM GOAL #4   Title Walk in clinic without assistive device 500 feet without deviation.   Time 8   Period Weeks   Status On-going     PT LONG TERM GOAL #5   Title Perform ADL's with pain not > 3/10.   Time 8   Period Weeks   Status On-going               Plan - 10/17/16 1153    Clinical Impression Statement Patient continues to progress with a great response to treatments, no pain complaints and improved AROM for right ankle DF. Patient has ongoing complaints of numbness in right medial foot and difficulty when trying to sleep at night. Patient has some relief with movement of right ankle and foot. Patient met LTG #2 today others ongoing due to strength and discomfort deficts.   Rehab Potential Excellent   PT Frequency 2x / week   PT Duration 8 weeks   PT Treatment/Interventions ADLs/Self Care Home Management;Cryotherapy;Electrical Stimulation;Moist Heat;Therapeutic  activities;Therapeutic exercise;Neuromuscular re-education;Patient/family education;Passive range of motion;Manual techniques;Vasopneumatic Device   PT Next Visit Plan Continue seated L ankle activities and LLE strengthening with modalities as symptoms dictate per MPT POC. (MD. Ninfa Linden 10/25/16)   Consulted and Agree with Plan of Care Patient      Patient will benefit from skilled therapeutic intervention in order to improve the following deficits and impairments:  Pain, Decreased activity tolerance, Increased edema, Decreased range of motion, Decreased mobility  Visit Diagnosis: Pain in left ankle and joints of left foot  Stiffness of left ankle, not elsewhere classified  Localized edema     Problem List Patient Active Problem List   Diagnosis Date Noted  . Closed displaced bimalleolar fracture of left ankle 08/16/2016  . Long term current use of anticoagulant 03/05/2016  . PAF (paroxysmal atrial fibrillation) ([place]) 02/11/2016  . Essential hypertension 02/11/2016  . Asthma 02/11/2016    Phillips Climes, PTA 10/17/2016, 12:07 PM  [facility] [place], [place], [address] Phone: [phone]   Fax:  [phone]  Name: [name] [name] MRN: [id number] Date of Birth: [birth year]

## 2016-10-22 ENCOUNTER — Ambulatory Visit: Payer: BLUE CROSS/BLUE SHIELD | Admitting: Physical Therapy

## 2016-10-22 ENCOUNTER — Encounter: Payer: Self-pay | Admitting: Physical Therapy

## 2016-10-22 DIAGNOSIS — M25572 Pain in left ankle and joints of left foot: Secondary | ICD-10-CM | POA: Diagnosis not present

## 2016-10-22 DIAGNOSIS — R600 Localized edema: Secondary | ICD-10-CM

## 2016-10-22 DIAGNOSIS — M25672 Stiffness of left ankle, not elsewhere classified: Secondary | ICD-10-CM

## 2016-10-22 NOTE — Therapy (Signed)
[facility] [address] [place], [place], [address] Phone: [phone]   Fax:  [phone]  Physical Therapy Treatment  Patient Details  Name: [name] [name] MRN: [id number] Date of Birth: Dec 22, 1950 Referring Provider: Doneen Poissonhristopher Blackman MD.  Encounter Date: 10/22/2016      PT End of Session - 10/22/16 1033    Visit Number 7   Number of Visits 16   Date for PT Re-Evaluation 12/01/16   PT Start Time 1030   PT Stop Time 1117   PT Time Calculation (min) 47 min   Activity Tolerance Patient tolerated treatment well   Behavior During Therapy [facility] for tasks assessed/performed      Past Medical History:  Diagnosis Date  . A-fib (HCC)   . Anemia    during pneumonia  . Arthritis   . Asthma   . Hypertension   . Pneumonia     Past Surgical History:  Procedure Laterality Date  . c section     1973 and 1983  . COLONOSCOPY    . ORIF ANKLE FRACTURE Left 08/16/2016  . ORIF ANKLE FRACTURE Left 08/16/2016   Procedure: OPEN REDUCTION INTERNAL FIXATION (ORIF) LEFT BIMALLEOLAR ANKLE FRACTURE;  Surgeon: Kathryne Hitchhristopher Y Blackman, MD;  Location: [facility] OR;  Service: Orthopedics;  Laterality: Left;  . TUBAL LIGATION      There were no vitals filed for this visit.      Subjective Assessment - 10/22/16 1031    Subjective Reports she slept good last night but had instances of decreased sleep last night. Reports that numbness is no longer present along L lateral foot but is now present along L first metatarsal and great toe.   Patient Stated Goals Get back to normal.   Currently in Pain? Yes   Pain Score 4    Pain Location Foot   Pain Orientation Left;Upper;Medial   Pain Descriptors / Indicators Numbness;Tightness   Pain Type Surgical pain            [facility] PT Assessment - 10/22/16 0001      Assessment   Medical Diagnosis Lt ankle bimalleolr fracture.   Onset Date/Surgical Date 08/05/16   Next MD Visit 10/25/2016     Precautions   Precaution Comments In left CAM boot WBAT.                     [facility] - 10/22/16 0001      Knee/Hip Exercises: Seated   Long Arc Quad Strengthening;Left;2 sets;10 reps;Weights   Long Arc Quad Weight 4 lbs.     Knee/Hip Exercises: Supine   Straight Leg Raises AROM;Left;2 sets;10 reps     Knee/Hip Exercises: Sidelying   Hip ABduction AROM;Right;2 sets;10 reps     Modalities   Modalities Proofreaderlectrical Stimulation;Vasopneumatic     Electrical Stimulation   Electrical Stimulation Location L ankle   Electrical Stimulation Action IFC   Electrical Stimulation Parameters 1-10 hz x15 min   Electrical Stimulation Goals Pain     Vasopneumatic   Number Minutes Vasopneumatic  15 minutes   Vasopnuematic Location  Ankle   Vasopneumatic Pressure Medium   Vasopneumatic Temperature  34     Manual Therapy   Manual Therapy Soft tissue mobilization   Soft tissue mobilization L ankle incision mobilizations to promote proper mobility     Ankle Exercises: Seated   ABC's 1 rep   Other Seated Ankle Exercises Seated rockerboard x6 minutes into DF/PF/INV/EVER   Other Seated Ankle Exercises  Dynadisc x 6 minutes all directions.     Ankle Exercises: Supine   Other Supine Ankle Exercises Red theraband exercise into DF/PF/INV/EVER 2x10each                PT Education - 10/22/16 1115    Education provided Yes   Education Details Educated patient to assess how L ankle reacted to red theraband strengthening today and to report back for another HEP for L ankle strengthening with red theraband accordingly.   Person(s) Educated Patient   Methods Explanation   Comprehension Verbalized understanding             PT Long Term Goals - 10/17/16 1141      PT LONG TERM GOAL #1   Title Independent with a HEP.   Time 8   Period Weeks   Status On-going     PT LONG TERM GOAL #2   Title Increase left ankle dorsiflexion to 6- 8 degrees to normalize the  patient's gait pattern   Time 8   Period Weeks   Status Achieved  AROM 10 degrees DF 10/17/16     PT LONG TERM GOAL #3   Title Increase left ankle strength to 4+ to 5/5 to increase stability for functional tasks   Time 8   Period Weeks     PT LONG TERM GOAL #4   Title Walk in clinic without assistive device 500 feet without deviation.   Time 8   Period Weeks   Status On-going     PT LONG TERM GOAL #5   Title Perform ADL's with pain not > 3/10.   Time 8   Period Weeks   Status On-going               Plan - 10/22/16 1033    Clinical Impression Statement Patient continues to progress accordingly with L ankle as well as hip/knee strengthening exercises. Patient did not report any discomfort with any exercises today although she continues to have difficulty with inversion and eversion with AROM exercises and with strengthening. Tactile cueing required by PTA for supine L ankle strengthening with red theraband to maintain allow for L ankle movement only. Patient now has numbness along L first metatarsal and into L great toe per patient report and also still has tightness feeling. Normal modalities response following removal of the modalities. Patient continues to ambulate with CAM boot donned and with FWW. Fairly good L ankle incision mobility upon palpation.   Rehab Potential Excellent   PT Frequency 2x / week   PT Duration 8 weeks   PT Treatment/Interventions ADLs/Self Care Home Management;Cryotherapy;Electrical Stimulation;Moist Heat;Therapeutic activities;Therapeutic exercise;Neuromuscular re-education;Patient/family education;Passive range of motion;Manual techniques;Vasopneumatic Device   PT Next Visit Plan MD note to be completed next treatment as patient returns to MD 10/25/2016   Consulted and Agree with Plan of Care Patient      Patient will benefit from skilled therapeutic intervention in order to improve the following deficits and impairments:  Pain, Decreased  activity tolerance, Increased edema, Decreased range of motion, Decreased mobility  Visit Diagnosis: Pain in left ankle and joints of left foot  Stiffness of left ankle, not elsewhere classified  Localized edema     Problem List Patient Active Problem List   Diagnosis Date Noted  . Closed displaced bimalleolar fracture of left ankle 08/16/2016  . Long term current use of anticoagulant 03/05/2016  . PAF (paroxysmal atrial fibrillation) (HCC) 02/11/2016  . Essential hypertension 02/11/2016  . Asthma 02/11/2016  Evelene CroonKelsey M Parsons, PTA 10/22/2016, 11:19 AM  [facility] [address] [place], [place], [address] Phone: [phone]   Fax:  [phone]  Name: [name] [name] MRN: [id number] Date of Birth: [birth year]

## 2016-10-24 ENCOUNTER — Ambulatory Visit: Payer: BLUE CROSS/BLUE SHIELD | Admitting: Physical Therapy

## 2016-10-24 ENCOUNTER — Encounter: Payer: Self-pay | Admitting: Physical Therapy

## 2016-10-24 DIAGNOSIS — R600 Localized edema: Secondary | ICD-10-CM

## 2016-10-24 DIAGNOSIS — M25572 Pain in left ankle and joints of left foot: Secondary | ICD-10-CM

## 2016-10-24 DIAGNOSIS — M25672 Stiffness of left ankle, not elsewhere classified: Secondary | ICD-10-CM

## 2016-10-24 NOTE — Therapy (Signed)
[facility] [address] [place], [place], [address] Phone: [phone]   Fax:  [phone]  Physical Therapy Treatment  Patient Details  Name: [name] [name] MRN: [id number] Date of Birth: 09-11-1951 Referring Provider: Doneen Poissonhristopher Blackman MD.  Encounter Date: 10/24/2016      PT End of Session - 10/24/16 1104    Visit Number 8   Number of Visits 16   Date for PT Re-Evaluation 12/01/16   PT Start Time 1030   PT Stop Time 1120   PT Time Calculation (min) 50 min   Activity Tolerance Patient tolerated treatment well   Behavior During Therapy [facility] for tasks assessed/performed      Past Medical History:  Diagnosis Date  . A-fib (HCC)   . Anemia    during pneumonia  . Arthritis   . Asthma   . Hypertension   . Pneumonia     Past Surgical History:  Procedure Laterality Date  . c section     1973 and 1983  . COLONOSCOPY    . ORIF ANKLE FRACTURE Left 08/16/2016  . ORIF ANKLE FRACTURE Left 08/16/2016   Procedure: OPEN REDUCTION INTERNAL FIXATION (ORIF) LEFT BIMALLEOLAR ANKLE FRACTURE;  Surgeon: Kathryne Hitchhristopher Y Blackman, MD;  [facility];  Service: Orthopedics;  Laterality: Left;  . TUBAL LIGATION      There were no vitals filed for this visit.      Subjective Assessment - 10/24/16 1035    Subjective Patient reported no change and has ongoing symptoms   Patient Stated Goals Get back to normal.   Currently in Pain? Yes   Pain Score 4    Pain Location Foot   Pain Orientation Left;Medial   Pain Descriptors / Indicators Tightness   Pain Type Surgical pain   Aggravating Factors  no movement    Pain Relieving Factors movement                         [facility] Adult PT Treatment/Exercise - 10/24/16 0001      Knee/Hip Exercises: Supine   Straight Leg Raises AROM;Left;2 sets;10 reps     Knee/Hip Exercises: Sidelying   Hip ABduction AROM;Right;2 sets;10 reps     Education officer, museumlectrical Stimulation   Electrical  Stimulation Location L ankle   Engineer, manufacturinglectrical Stimulation Action IFC   Electrical Stimulation Parameters 1-10hz  x5115min   Electrical Stimulation Goals Pain     Vasopneumatic   Number Minutes Vasopneumatic  15 minutes   Vasopnuematic Location  Ankle   Vasopneumatic Pressure Medium     Manual Therapy   Passive ROM PROM to left ankle for all motions with gentle range     Ankle Exercises: Seated   Other Seated Ankle Exercises Seated rockerboard x6 minutes into DF/PF/INV/EVER   Other Seated Ankle Exercises Dynadisc x 6 minutes all directions.     Ankle Exercises: Supine   Other Supine Ankle Exercises Red theraband exercise into DF/PF/INV/EVER 2x10each                     PT Long Term Goals - 10/17/16 1141      PT LONG TERM GOAL #1   Title Independent with a HEP.   Time 8   Period Weeks   Status On-going     PT LONG TERM GOAL #2   Title Increase left ankle dorsiflexion to 6- 8 degrees to normalize the patient's gait pattern   Time 8   Period Weeks   Status  Achieved  AROM 10 degrees DF 10/17/16     PT LONG TERM GOAL #3   Title Increase left ankle strength to 4+ to 5/5 to increase stability for functional tasks   Time 8   Period Weeks     PT LONG TERM GOAL #4   Title Walk in clinic without assistive device 500 feet without deviation.   Time 8   Period Weeks   Status On-going     PT LONG TERM GOAL #5   Title Perform ADL's with pain not > 3/10.   Time 8   Period Weeks   Status On-going               Plan - 10/24/16 1106    Clinical Impression Statement Patient progressing slowly with all activities due to limitations in CAM boot currently. Patient has improved overall with increased ROM in ankle and les symptoms and pain with movement in tharapy. Patient has ongoing tightnesss and a numbness in medial foot. Patient current goals ongoing due to pain, and strength deficts.   Rehab Potential Excellent   PT Frequency 2x / week   PT Duration 8 weeks   PT  Treatment/Interventions ADLs/Self Care Home Management;Cryotherapy;Electrical Stimulation;Moist Heat;Therapeutic activities;Therapeutic exercise;Neuromuscular re-education;Patient/family education;Passive range of motion;Manual techniques;Vasopneumatic Device   PT Next Visit Plan MD. Magnus IvanBlackman appt 10/25/16 note sent today and will Cont with POC per MD   Consulted and Agree with Plan of Care Patient      Patient will benefit from skilled therapeutic intervention in order to improve the following deficits and impairments:  Pain, Decreased activity tolerance, Increased edema, Decreased range of motion, Decreased mobility  Visit Diagnosis: Pain in left ankle and joints of left foot  Stiffness of left ankle, not elsewhere classified  Localized edema     Problem List Patient Active Problem List   Diagnosis Date Noted  . Closed displaced bimalleolar fracture of left ankle 08/16/2016  . Long term current use of anticoagulant 03/05/2016  . PAF (paroxysmal atrial fibrillation) (HCC) 02/11/2016  . Essential hypertension 02/11/2016  . Asthma 02/11/2016    APPLEGATE, [place] , PTA 10/24/2016, 11:53 AM  Italyhad Applegate MPT [facility] [address] [place], [place], [address] Phone: [phone]   Fax:  [phone]  Name: [name] [name] MRN: [id number] Date of Birth: Apr 08, 1951

## 2016-10-25 ENCOUNTER — Ambulatory Visit (INDEPENDENT_AMBULATORY_CARE_PROVIDER_SITE_OTHER): Payer: BLUE CROSS/BLUE SHIELD

## 2016-10-25 ENCOUNTER — Ambulatory Visit (INDEPENDENT_AMBULATORY_CARE_PROVIDER_SITE_OTHER): Payer: BLUE CROSS/BLUE SHIELD | Admitting: Orthopaedic Surgery

## 2016-10-25 DIAGNOSIS — M25572 Pain in left ankle and joints of left foot: Secondary | ICD-10-CM

## 2016-10-25 MED ORDER — GABAPENTIN 300 MG PO CAPS: 300.0000 mg | ORAL_CAPSULE | Freq: Every day | ORAL | 0 refills | Status: DC

## 2016-10-25 NOTE — Progress Notes (Signed)
The patient is making great improvements 9 weeks status post open reduction and fixation of complex bimalleolar ankle fracture. His of her left ankle. She's been weightbearing as tolerated in the cam walker and going to physical therapy. She's been using a walker.  On examination of her ankle incisions both look good. There is actually retained Vicryl sutures both medially and laterally that I removed easily. There is no evidence infection. Her range of motion is almost full of her ankle. She still has residual swelling. It feels like it was a stable.  This point we'll transition her to a cane. We'll also try an ASO for her left ankle. She'll continue physical therapy. I gave her a note to keep her out of work for 4 more weeks. We see her back in 4 weeks and will be for repeat exam but no x-rays are needed.

## 2016-10-29 ENCOUNTER — Encounter: Payer: Self-pay | Admitting: Physical Therapy

## 2016-10-29 ENCOUNTER — Ambulatory Visit: Payer: BLUE CROSS/BLUE SHIELD | Attending: Orthopaedic Surgery | Admitting: Physical Therapy

## 2016-10-29 DIAGNOSIS — M25672 Stiffness of left ankle, not elsewhere classified: Secondary | ICD-10-CM | POA: Insufficient documentation

## 2016-10-29 DIAGNOSIS — R600 Localized edema: Secondary | ICD-10-CM | POA: Diagnosis present

## 2016-10-29 DIAGNOSIS — M25572 Pain in left ankle and joints of left foot: Secondary | ICD-10-CM | POA: Diagnosis present

## 2016-10-29 NOTE — Therapy (Signed)
[facility] [place], [place], [address] Phone: [phone]   Fax:  [phone]  Physical Therapy Treatment  Patient Details  Name: [name] [name] MRN: [id number] Date of Birth: [birth year] Referring Provider: Jean Rosenthal MD.  Encounter Date: 10/29/2016      PT End of Session - 10/29/16 1055    Visit Number 9   Number of Visits 16   Date for PT Re-Evaluation 12/01/16   PT Start Time 1031   PT Stop Time 1127   PT Time Calculation (min) 56 min   Activity Tolerance Patient tolerated treatment well   Behavior During Therapy [facility] for tasks assessed/performed      Past Medical History:  Diagnosis Date  . A-fib ([place])   . Anemia    during pneumonia  . Arthritis   . Asthma   . Hypertension   . Pneumonia     Past Surgical History:  Procedure Laterality Date  . c section     1973 and 1983  . COLONOSCOPY    . ORIF ANKLE FRACTURE Left 08/16/2016  . ORIF ANKLE FRACTURE Left 08/16/2016   Procedure: OPEN REDUCTION INTERNAL FIXATION (ORIF) LEFT BIMALLEOLAR ANKLE FRACTURE;  Surgeon: Mcarthur Rossetti, MD;  Location: [facility];  Service: Orthopedics;  Laterality: Left;  . TUBAL LIGATION      There were no vitals filed for this visit.      Subjective Assessment - 10/29/16 1033    Subjective Patient arrived with brace today and MD removed CAM boot and reported good healing and able to progress   Patient Stated Goals Get back to normal.   Currently in Pain? Yes   Pain Score 2    Pain Location Foot   Pain Orientation Left;Medial   Pain Descriptors / Indicators Tightness   Pain Type Surgical pain   Aggravating Factors  stiff with no movement   Pain Relieving Factors activity and movement                         [facility] Adult PT Treatment/Exercise - 10/29/16 0001      Ambulation/Gait   Ambulation/Gait Yes   Ambulation/Gait Assistance 5: Supervision   Ambulation Distance (Feet) 168 Feet   Assistive device Straight cane   Gait Pattern Step-to pattern;Decreased stance time - left   Ambulation Surface Level     Knee/Hip Exercises: Aerobic   Nustep 75mn L4 UE/LE     Electrical Stimulation   Electrical Stimulation Location L ankle   Electrical Stimulation Action premod   Electrical Stimulation Parameters 1-10hz  x172m   Electrical Stimulation Goals Pain;Edema     Vasopneumatic   Number Minutes Vasopneumatic  15 minutes   Vasopnuematic Location  Ankle   Vasopneumatic Pressure Medium     Ankle Exercises: Standing   Rocker Board 3 minutes   Heel Raises 20 reps   Toe Raise 20 reps   Other Standing Ankle Exercises dyna disc circles 2x10   Other Standing Ankle Exercises 4 in step up 3x10     Ankle Exercises: Seated   Other Seated Ankle Exercises ankle isolator 1# DF 3x10     Ankle Exercises: Supine   Other Supine Ankle Exercises green t-band inv/ever 2x10                     PT Long Term Goals - 10/17/16 1141      PT LONG TERM GOAL #1   Title Independent with a  HEP.   Time 8   Period Weeks   Status On-going     PT LONG TERM GOAL #2   Title Increase left ankle dorsiflexion to 6- 8 degrees to normalize the patient's gait pattern   Time 8   Period Weeks   Status Achieved  AROM 10 degrees DF 10/17/16     PT LONG TERM GOAL #3   Title Increase left ankle strength to 4+ to 5/5 to increase stability for functional tasks   Time 8   Period Weeks     PT LONG TERM GOAL #4   Title Walk in clinic without assistive device 500 feet without deviation.   Time 8   Period Weeks   Status On-going     PT LONG TERM GOAL #5   Title Perform ADL's with pain not > 3/10.   Time 8   Period Weeks   Status On-going               Plan - 10/29/16 1120    Clinical Impression Statement Patient tolerated treatment well today and had no increased symptoms. Patient progressing with standing exercises and gait training today. Patient had some difficulty with  technique with inv/eversion using ankle isolator and did better with t-band. Patient current goals progressng yet not met due to strength deficits.   Rehab Potential Excellent   PT Frequency 2x / week   PT Duration 8 weeks   PT Treatment/Interventions ADLs/Self Care Home Management;Cryotherapy;Electrical Stimulation;Moist Heat;Therapeutic activities;Therapeutic exercise;Neuromuscular re-education;Patient/family education;Passive range of motion;Manual techniques;Vasopneumatic Device   PT Next Visit Plan cont with POC per MPT (MD. Ninfa Linden 11/22/16)   Consulted and Agree with Plan of Care Patient      Patient will benefit from skilled therapeutic intervention in order to improve the following deficits and impairments:  Pain, Decreased activity tolerance, Increased edema, Decreased range of motion, Decreased mobility  Visit Diagnosis: Pain in left ankle and joints of left foot  Stiffness of left ankle, not elsewhere classified  Localized edema     Problem List Patient Active Problem List   Diagnosis Date Noted  . Closed displaced bimalleolar fracture of left ankle 08/16/2016  . Long term current use of anticoagulant 03/05/2016  . PAF (paroxysmal atrial fibrillation) ([place]) 02/11/2016  . Essential hypertension 02/11/2016  . Asthma 02/11/2016    Elvert Cumpton P, PTA 10/29/2016, 11:28 AM  [facility] [place], [place], [address] Phone: [phone]   Fax:  [phone]  Name: [name] [name] MRN: [id number] Date of Birth: 04-30-1951

## 2016-10-31 ENCOUNTER — Ambulatory Visit: Payer: BLUE CROSS/BLUE SHIELD | Admitting: Physical Therapy

## 2016-10-31 ENCOUNTER — Encounter: Payer: Self-pay | Admitting: Physical Therapy

## 2016-10-31 DIAGNOSIS — R600 Localized edema: Secondary | ICD-10-CM

## 2016-10-31 DIAGNOSIS — M25572 Pain in left ankle and joints of left foot: Secondary | ICD-10-CM | POA: Diagnosis not present

## 2016-10-31 DIAGNOSIS — M25672 Stiffness of left ankle, not elsewhere classified: Secondary | ICD-10-CM

## 2016-10-31 NOTE — Therapy (Signed)
[facility] [address] [place], [place], [address] Phone: [phone]   Fax:  [phone]  Physical Therapy Treatment  Patient Details  Name: [name] [name] MRN: [id number] Date of Birth: [birth year] Referring Provider: Doneen Poissonhristopher Blackman MD.  Encounter Date: 10/31/2016      PT End of Session - 10/31/16 1058    Visit Number 10   Number of Visits 16   Date for PT Re-Evaluation 12/01/16   PT Start Time 1029   PT Stop Time 1128   PT Time Calculation (min) 59 min   Activity Tolerance Patient tolerated treatment well   Behavior During Therapy [facility] for tasks assessed/performed      Past Medical History:  Diagnosis Date  . A-fib (HCC)   . Anemia    during pneumonia  . Arthritis   . Asthma   . Hypertension   . Pneumonia     Past Surgical History:  Procedure Laterality Date  . c section     1973 and 1983  . COLONOSCOPY    . ORIF ANKLE FRACTURE Left 08/16/2016  . ORIF ANKLE FRACTURE Left 08/16/2016   Procedure: OPEN REDUCTION INTERNAL FIXATION (ORIF) LEFT BIMALLEOLAR ANKLE FRACTURE;  Surgeon: Kathryne Hitchhristopher Y Blackman, MD;  Location: [facility];  Service: Orthopedics;  Laterality: Left;  . TUBAL LIGATION      There were no vitals filed for this visit.      Subjective Assessment - 10/31/16 1035    Subjective Patient arrived with reports of feeling good after last treatment   Patient Stated Goals Get back to normal.   Currently in Pain? Yes   Pain Score 2    Pain Location Foot   Pain Orientation Left;Medial   Pain Descriptors / Indicators Tightness  numbness   Pain Type Surgical pain   Aggravating Factors  stiff with no movement   Pain Relieving Factors activity and movement                         [facility] Adult PT Treatment/Exercise - 10/31/16 0001      Knee/Hip Exercises: Aerobic   Nustep 15min L4 UE/LE     Knee/Hip Exercises: Supine   Straight Leg Raise with External Rotation Strengthening;Right;2  sets;10 reps;AROM     Knee/Hip Exercises: Sidelying   Hip ABduction AROM;Right;2 sets;10 reps     Programme researcher, broadcasting/film/videolectrical Stimulation   Electrical Stimulation Location L ankle   Electrical Stimulation Action premod   Electrical Stimulation Parameters 1-10hz  x415min   Electrical Stimulation Goals Pain;Edema     Vasopneumatic   Number Minutes Vasopneumatic  15 minutes   Vasopnuematic Location  Ankle   Vasopneumatic Pressure Medium     Ankle Exercises: Standing   Rocker Board 3 minutes   Heel Raises 20 reps   Toe Raise 20 reps   Other Standing Ankle Exercises dyna disc circles 2x10   Other Standing Ankle Exercises 6 in step up 2x10     Ankle Exercises: Seated   Other Seated Ankle Exercises ankle isolator 1# DF 3x10     Ankle Exercises: Supine   Other Supine Ankle Exercises green t-band inv/ever 2x10                     PT Long Term Goals - 10/17/16 1141      PT LONG TERM GOAL #1   Title Independent with a HEP.   Time 8   Period Weeks   Status On-going  PT LONG TERM GOAL #2   Title Increase left ankle dorsiflexion to 6- 8 degrees to normalize the patient's gait pattern   Time 8   Period Weeks   Status Achieved  AROM 10 degrees DF 10/17/16     PT LONG TERM GOAL #3   Title Increase left ankle strength to 4+ to 5/5 to increase stability for functional tasks   Time 8   Period Weeks     PT LONG TERM GOAL #4   Title Walk in clinic without assistive device 500 feet without deviation.   Time 8   Period Weeks   Status On-going     PT LONG TERM GOAL #5   Title Perform ADL's with pain not > 3/10.   Time 8   Period Weeks   Status On-going               Plan - 10/31/16 1110    Clinical Impression Statement Patient tolerated treatment well today with no increased symptoms. Patient continues to progress with standing activities. Patient able to perform exercises with good technique today. Patient is currently using a FWW yet will be getting SPC soon. Patient  current goals ongoing due to ongoing symptoms in foot and strength deficts.   Rehab Potential Excellent   PT Frequency 2x / week   PT Duration 8 weeks   PT Treatment/Interventions ADLs/Self Care Home Management;Cryotherapy;Electrical Stimulation;Moist Heat;Therapeutic activities;Therapeutic exercise;Neuromuscular re-education;Patient/family education;Passive range of motion;Manual techniques;Vasopneumatic Device   PT Next Visit Plan cont with POC per MPT (MD. Magnus IvanBlackman 11/22/16)   Consulted and Agree with Plan of Care Patient      Patient will benefit from skilled therapeutic intervention in order to improve the following deficits and impairments:  Pain, Decreased activity tolerance, Increased edema, Decreased range of motion, Decreased mobility  Visit Diagnosis: Pain in left ankle and joints of left foot  Stiffness of left ankle, not elsewhere classified  Localized edema     Problem List Patient Active Problem List   Diagnosis Date Noted  . Closed displaced bimalleolar fracture of left ankle 08/16/2016  . Long term current use of anticoagulant 03/05/2016  . PAF (paroxysmal atrial fibrillation) (HCC) 02/11/2016  . Essential hypertension 02/11/2016  . Asthma 02/11/2016    Skiler Olden P, PTA 10/31/2016, 11:28 AM  Cathie Hoopshristina Simran Bomkamp, PTA 10/31/16 11:28 AM  [facility] [address] [place], [place], [address] Phone: [phone]   Fax:  [phone]  Name: [name] [name] MRN: [id number] Date of Birth: Jul 08, 1951

## 2016-11-05 ENCOUNTER — Ambulatory Visit: Payer: BLUE CROSS/BLUE SHIELD | Admitting: Physical Therapy

## 2016-11-05 DIAGNOSIS — M25672 Stiffness of left ankle, not elsewhere classified: Secondary | ICD-10-CM

## 2016-11-05 DIAGNOSIS — M25572 Pain in left ankle and joints of left foot: Secondary | ICD-10-CM | POA: Diagnosis not present

## 2016-11-05 NOTE — Therapy (Signed)
[facility] [place], [place], [address] Phone: [phone]   Fax:  [phone]  Physical Therapy Treatment  Patient Details  Name: [name] [name] MRN: [id number] Date of Birth: [birth year] Referring Provider: Jean Rosenthal MD.  Encounter Date: 11/05/2016      PT End of Session - 11/05/16 1018    Visit Number 11   Number of Visits 16   Date for PT Re-Evaluation 12/01/16   PT Start Time 1016   PT Stop Time 1123   PT Time Calculation (min) 67 min   Activity Tolerance Patient tolerated treatment well   Behavior During Therapy [facility] for tasks assessed/performed      Past Medical History:  Diagnosis Date  . A-fib ([place])   . Anemia    during pneumonia  . Arthritis   . Asthma   . Hypertension   . Pneumonia     Past Surgical History:  Procedure Laterality Date  . c section     1973 and 1983  . COLONOSCOPY    . ORIF ANKLE FRACTURE Left 08/16/2016  . ORIF ANKLE FRACTURE Left 08/16/2016   Procedure: OPEN REDUCTION INTERNAL FIXATION (ORIF) LEFT BIMALLEOLAR ANKLE FRACTURE;  Surgeon: Mcarthur Rossetti, MD;  Location: [place];  Service: Orthopedics;  Laterality: Left;  . TUBAL LIGATION      There were no vitals filed for this visit.      Subjective Assessment - 11/05/16 1019    Subjective Patient presents today with SPC. She reports no pain, but continued intermittent numbness on dorsum of foot.   Patient Stated Goals Get back to normal.   Currently in Pain? No/denies            [facility] PT Assessment - 11/05/16 0001      ROM / Strength   AROM / PROM / Strength Strength     Strength   Strength Assessment Site Ankle   Right/Left Ankle Left   Left Ankle Dorsiflexion 4/5   Left Ankle Inversion 4-/5   Left Ankle Eversion 4+/5                     [facility] Adult PT Treatment/Exercise - 11/05/16 0001      Ambulation/Gait   Ambulation/Gait Yes   Ambulation/Gait Assistance 6: Modified  independent (Device/Increase time)   Ambulation Distance (Feet) 300 Feet   Assistive device Straight cane   Gait Pattern Decreased step length - right;Decreased stance time - left   Ambulation Surface Level   Pre-Gait Activities worked on increased step/stride length B using wall for support and then [facility]; also utilized Geologist, engineering for feedback     Knee/Hip Exercises: Aerobic   Nustep 8mn L4 UE/LE     Modalities   Modalities Cryotherapy;Electrical Stimulation     Cryotherapy   Number Minutes Cryotherapy 15 Minutes   Cryotherapy Location Ankle   Type of Cryotherapy Ice pack     Electrical Stimulation   Electrical Stimulation Location L ankle IFC 1-10 Hz x 15 min   Electrical Stimulation Goals Edema     Ankle Exercises: Standing   BAPS Level 2;Standing;15 reps  flex/ext only   SLS with hip ABD B x 20   L hip ABD is not smooth   Rocker Board 2 minutes  balance with head turns and flex/ext   Heel Raises 20 reps   Toe Raise 20 reps   Other Standing Ankle Exercises dyna disc circles 2x10   Other Standing Ankle Exercises 6 in  step up 2x10                PT Education - 11/05/16 1120    Education provided Yes   Education Details HEP   Person(s) Educated Patient   Methods Explanation;Demonstration;Handout;Verbal cues   Comprehension Verbalized understanding;Returned demonstration             PT Long Term Goals - 11/05/16 1021      PT LONG TERM GOAL #1   Title Independent with a HEP.   Time 8   Period Weeks   Status On-going     PT LONG TERM GOAL #2   Title Increase left ankle dorsiflexion to 6- 8 degrees to normalize the patient's gait pattern   Time 8   Period Weeks   Status Achieved     PT LONG TERM GOAL #3   Title Increase left ankle strength to 4+ to 5/5 to increase stability for functional tasks   Time 8   Period Weeks   Status On-going     PT LONG TERM GOAL #4   Title Walk in clinic without assistive device 500 feet without deviation.   Time 8    Period Weeks   Status On-going     PT LONG TERM GOAL #5   Title Perform ADL's with pain not > 3/10.   Baseline 2/10 on average, but still uses pain pills   Time 8   Period Weeks   Status Partially Met               Plan - 11/05/16 1028    Clinical Impression Statement Patient did very well with treatment today. She has tightness in L piriformis and ITB which is affecting her gait and therex somewhat. Stretches were issued. She did well with exaggerating step/stride lengths, improving gait pattern significantly with SPC afterwards. Goals were assessed and unmet goals are ongoing.   Rehab Potential Excellent   PT Frequency 2x / week   PT Duration 8 weeks   PT Treatment/Interventions ADLs/Self Care Home Management;Cryotherapy;Electrical Stimulation;Moist Heat;Therapeutic activities;Therapeutic exercise;Neuromuscular re-education;Patient/family education;Passive range of motion;Manual techniques;Vasopneumatic Device   PT Next Visit Plan cont with POC per MPT (MD. Ninfa Linden 11/22/16). Add green TBand ankle exercises to HEP. Continue to work on normal gait pattern and L hip flexibility.   Consulted and Agree with Plan of Care Patient      Patient will benefit from skilled therapeutic intervention in order to improve the following deficits and impairments:  Pain, Decreased activity tolerance, Increased edema, Decreased range of motion, Decreased mobility  Visit Diagnosis: Stiffness of left ankle, not elsewhere classified     Problem List Patient Active Problem List   Diagnosis Date Noted  . Closed displaced bimalleolar fracture of left ankle 08/16/2016  . Long term current use of anticoagulant 03/05/2016  . PAF (paroxysmal atrial fibrillation) ([place]) 02/11/2016  . Essential hypertension 02/11/2016  . Asthma 02/11/2016    Madelyn Flavors PT 11/05/2016, 12:26 PM  [facility] [address] [place], [place], [address] Phone:  [phone]   Fax:  [phone]  Name: [name] [name] MRN: [id number] Date of Birth: [birth year]

## 2016-11-05 NOTE — Patient Instructions (Signed)
HOLD STRETCHES 60 SECONDS. REPEAT 3 TIMES; 2-3 TIMES PER DAY.  Hip Stretch  Put right ankle over left knee. Let right knee fall downward, but keep ankle in place. Feel the stretch in hip. May push down gently with hand to feel stretch. Hold ____ seconds while counting out loud. Repeat with other leg. Repeat ____ times. Do ____ sessions per day.   Stretching: Piriformis (Supine)  Pull right knee across body and/or toward opposite shoulder. Hold ____ seconds. Relax. Repeat ____ times per set. Do ____ sets per session. Do ____ sessions per day.    Outer Hip Stretch: Reclined IT Band Stretch (Strap)   Strap around opposite foot, pull across only as far as possible with shoulders on mat. Hold for __60__ seconds. Repeat __3__ times each leg. 2-3 x/day.  Solon PalmJulie Vicci Reder, PT 11/05/16 11:11 AM [facility] [address] [place], [place], [id number] Phone: [phone]   Fax:  [phone]

## 2016-11-07 ENCOUNTER — Ambulatory Visit: Payer: BLUE CROSS/BLUE SHIELD | Admitting: Physical Therapy

## 2016-11-07 ENCOUNTER — Encounter: Payer: Self-pay | Admitting: Physical Therapy

## 2016-11-07 DIAGNOSIS — M25572 Pain in left ankle and joints of left foot: Secondary | ICD-10-CM

## 2016-11-07 DIAGNOSIS — M25672 Stiffness of left ankle, not elsewhere classified: Secondary | ICD-10-CM

## 2016-11-07 DIAGNOSIS — R600 Localized edema: Secondary | ICD-10-CM

## 2016-11-07 NOTE — Therapy (Signed)
[facility] [place], [place], [address] Phone: [phone]   Fax:  [phone]  Physical Therapy Treatment  Patient Details  Name: [name] [name] MRN: [id number] Date of Birth: 06/07/1951 Referring Provider: Jean Rosenthal MD.  Encounter Date: 11/07/2016      PT End of Session - 11/07/16 1258    Visit Number 12   Number of Visits 16   Date for PT Re-Evaluation 12/01/16   PT Start Time 1257   PT Stop Time 6720   PT Time Calculation (min) 58 min   Activity Tolerance Patient tolerated treatment well   Behavior During Therapy [facility] for tasks assessed/performed      Past Medical History:  Diagnosis Date  . A-fib ([place])   . Anemia    during pneumonia  . Arthritis   . Asthma   . Hypertension   . Pneumonia     Past Surgical History:  Procedure Laterality Date  . c section     1973 and 1983  . COLONOSCOPY    . ORIF ANKLE FRACTURE Left 08/16/2016  . ORIF ANKLE FRACTURE Left 08/16/2016   Procedure: OPEN REDUCTION INTERNAL FIXATION (ORIF) LEFT BIMALLEOLAR ANKLE FRACTURE;  Surgeon: Mcarthur Rossetti, MD;  Location: [facility];  Service: Orthopedics;  Laterality: Left;  . TUBAL LIGATION      There were no vitals filed for this visit.      Subjective Assessment - 11/07/16 1258    Subjective Reports that she still has some numbness along first metatarsal of L foot. Reports she had pain yesterday after standing for too long at [facility] but went home and iced her ankle.   Patient Stated Goals Get back to normal.   Currently in Pain? Yes   Pain Score --  No rating provided   Pain Location Foot   Pain Orientation Left   Pain Descriptors / Indicators Numbness   Pain Type Surgical pain            [facility] PT Assessment - 11/07/16 0001      Assessment   Medical Diagnosis Lt ankle bimalleolr fracture.   Onset Date/Surgical Date 08/05/16   Next MD Visit 11/22/2016                     [facility] Adult PT  Treatment/Exercise - 11/07/16 0001      Knee/Hip Exercises: Aerobic   Nustep L4 x15 min     Knee/Hip Exercises: Standing   Hip Flexion AROM;Left;2 sets;10 reps;Knee bent   Hip Abduction AROM;Left;2 sets;10 reps;Knee straight   Forward Step Up Left;2 sets;10 reps;Hand Hold: 2;Step Height: 6"     Knee/Hip Exercises: Seated   Long Arc Quad Strengthening;Left;2 sets;10 reps;Weights   Long Arc Quad Weight 4 lbs.     Modalities   Modalities Passenger transport manager Location L ankle   Electrical Stimulation Action Pre-Mod   Electrical Stimulation Parameters 80-150 hz x15 min   Electrical Stimulation Goals Edema     Vasopneumatic   Number Minutes Vasopneumatic  15 minutes   Vasopnuematic Location  Ankle   Vasopneumatic Pressure Medium   Vasopneumatic Temperature  34     Ankle Exercises: Standing   BAPS Standing;Level 2  x20 reps each; A/P, circles    SLS LLE SLS with RLE hip flex and abduction x20 reps with 2 UE support   Rocker Board 3 minutes   Heel Raises 20 reps   Toe  Raise 20 reps   Other Standing Ankle Exercises L ankle dynadisc CW and CCW x20 reps ach     Ankle Exercises: Seated   Other Seated Ankle Exercises L ankle isolator 1# DF/Inv/Ev x20 reps each                     PT Long Term Goals - 11/07/16 1341      PT LONG TERM GOAL #1   Title Independent with a HEP.   Time 8   Period Weeks   Status Achieved     PT LONG TERM GOAL #2   Title Increase left ankle dorsiflexion to 6- 8 degrees to normalize the patient's gait pattern   Time 8   Period Weeks   Status Achieved     PT LONG TERM GOAL #3   Title Increase left ankle strength to 4+ to 5/5 to increase stability for functional tasks   Time 8   Period Weeks   Status On-going     PT LONG TERM GOAL #4   Title Walk in clinic without assistive device 500 feet without deviation.   Time 8   Period Weeks   Status On-going     PT LONG  TERM GOAL #5   Title Perform ADL's with pain not > 3/10.   Baseline 2/10 on average, but still uses pain pills   Time 8   Period Weeks   Status Partially Met               Plan - 11/07/16 1345    Clinical Impression Statement Patient continues to tolerate treatment well with no reports of any current pain during treatment. Patient experiences increased standing such as what occured yesterday in [employer] per patient report. Patient able to tolerate different standing and sitting L ankle and LLE strengthening well in parallel bars. Patient now wears lace up L ankle brace and SPC now for ambulation. Redness present around R anterior ankle scab secondary to possible irritation from shoe and brace. Normal modalities response noted following removal of the modalities.   Rehab Potential Excellent   PT Frequency 2x / week   PT Duration 8 weeks   PT Treatment/Interventions ADLs/Self Care Home Management;Cryotherapy;Electrical Stimulation;Moist Heat;Therapeutic activities;Therapeutic exercise;Neuromuscular re-education;Patient/family education;Passive range of motion;Manual techniques;Vasopneumatic Device   PT Next Visit Plan Continue L ankle strengthening and LLE strengthening to improve function with modalities PRN for pain/edema per MPT POC.   Consulted and Agree with Plan of Care Patient      Patient will benefit from skilled therapeutic intervention in order to improve the following deficits and impairments:  Pain, Decreased activity tolerance, Increased edema, Decreased range of motion, Decreased mobility  Visit Diagnosis: Stiffness of left ankle, not elsewhere classified  Pain in left ankle and joints of left foot  Localized edema     Problem List Patient Active Problem List   Diagnosis Date Noted  . Closed displaced bimalleolar fracture of left ankle 08/16/2016  . Long term current use of anticoagulant 03/05/2016  . PAF (paroxysmal atrial fibrillation) ([place]) 02/11/2016  .  Essential hypertension 02/11/2016  . Asthma 02/11/2016    Wynelle Fanny, PTA 11/07/2016, 2:08 PM  [facility] [address] [place], [place], [address] Phone: [phone]   Fax:  [phone]  Name: [name] [name] MRN: [id number] Date of Birth: 1951-07-29

## 2016-11-12 ENCOUNTER — Ambulatory Visit: Payer: BLUE CROSS/BLUE SHIELD | Admitting: Physical Therapy

## 2016-11-12 DIAGNOSIS — M25672 Stiffness of left ankle, not elsewhere classified: Secondary | ICD-10-CM

## 2016-11-12 DIAGNOSIS — M25572 Pain in left ankle and joints of left foot: Secondary | ICD-10-CM | POA: Diagnosis not present

## 2016-11-12 DIAGNOSIS — R600 Localized edema: Secondary | ICD-10-CM

## 2016-11-12 NOTE — Therapy (Signed)
[facility] [place], [place], [address] Phone: [phone]   Fax:  [phone]  Physical Therapy Treatment  Patient Details  Name: [name] [name] MRN: [id number] Date of Birth: 12-11-1950 Referring Provider: Jean Rosenthal MD.  Encounter Date: 11/12/2016      PT End of Session - 11/12/16 1021    Visit Number 13   Number of Visits 16   Date for PT Re-Evaluation 12/01/16   PT Start Time 2536   PT Stop Time 1124   PT Time Calculation (min) 61 min   Activity Tolerance Patient tolerated treatment well   Behavior During Therapy [facility] for tasks assessed/performed      Past Medical History:  Diagnosis Date  . A-fib ([place])   . Anemia    during pneumonia  . Arthritis   . Asthma   . Hypertension   . Pneumonia     Past Surgical History:  Procedure Laterality Date  . c section     1973 and 1983  . COLONOSCOPY    . ORIF ANKLE FRACTURE Left 08/16/2016  . ORIF ANKLE FRACTURE Left 08/16/2016   Procedure: OPEN REDUCTION INTERNAL FIXATION (ORIF) LEFT BIMALLEOLAR ANKLE FRACTURE;  Surgeon: Mcarthur Rossetti, MD;  Location: [facility];  Service: Orthopedics;  Laterality: Left;  . TUBAL LIGATION      There were no vitals filed for this visit.      Subjective Assessment - 11/12/16 1025    Subjective Patient states she slept the whole night through last Friday and only woke up once last night. Saturday she only slept about 3 hours.   Patient Stated Goals Get back to normal.   Currently in Pain? No/denies                         [facility] Adult PT Treatment/Exercise - 11/12/16 0001      Ambulation/Gait   Ambulation/Gait Yes     Knee/Hip Exercises: Aerobic   Nustep L5 x15 min     Knee/Hip Exercises: Standing   Forward Step Up Left;2 sets;10 reps;Hand Hold: 2;Step Height: 8"     Modalities   Modalities Passenger transport manager Location L  ankle   Electrical Stimulation Action Premod   Electrical Stimulation Parameters 1-10 Hz x 15 min   Electrical Stimulation Goals Edema     Vasopneumatic   Number Minutes Vasopneumatic  15 minutes   Vasopnuematic Location  Ankle   Vasopneumatic Pressure Medium   Vasopneumatic Temperature  34     Ankle Exercises: Standing   BAPS Standing;Level 2  x20 reps each; A/P, circles    SLS LLE SLS with RLE hip flex and abduction x20 reps with 1 UE support   Heel Raises 20 reps   Toe Raise 20 reps   Other Standing Ankle Exercises L ankle dynadisc PF/DF; CW and CCW x20 reps ach     Ankle Exercises: Seated   Other Seated Ankle Exercises L ankle isolator 1# DF/Inv/Ev x20 reps each     Ankle Exercises: Sidelying   Ankle Inversion 10 reps;Left                     PT Long Term Goals - 11/07/16 1341      PT LONG TERM GOAL #1   Title Independent with a HEP.   Time 8   Period Weeks   Status Achieved     PT  LONG TERM GOAL #2   Title Increase left ankle dorsiflexion to 6- 8 degrees to normalize the patient's gait pattern   Time 8   Period Weeks   Status Achieved     PT LONG TERM GOAL #3   Title Increase left ankle strength to 4+ to 5/5 to increase stability for functional tasks   Time 8   Period Weeks   Status On-going     PT LONG TERM GOAL #4   Title Walk in clinic without assistive device 500 feet without deviation.   Time 8   Period Weeks   Status On-going     PT LONG TERM GOAL #5   Title Perform ADL's with pain not > 3/10.   Baseline 2/10 on average, but still uses pain pills   Time 8   Period Weeks   Status Partially Met               Plan - 11/12/16 1035    Clinical Impression Statement Patient continues to do well with SLS activities for strenthening. She is still demonstrating decreased stance time on the LLE with gait, but she states it is more to do with L knee pain than her ankle.    PT Treatment/Interventions ADLs/Self Care Home  Management;Cryotherapy;Electrical Stimulation;Moist Heat;Therapeutic activities;Therapeutic exercise;Neuromuscular re-education;Patient/family education;Passive range of motion;Manual techniques;Vasopneumatic Device   PT Next Visit Plan Continue L ankle strengthening and LLE strengthening to improve function with modalities PRN for pain/edema per MPT POC.      Patient will benefit from skilled therapeutic intervention in order to improve the following deficits and impairments:  Pain, Decreased activity tolerance, Increased edema, Decreased range of motion, Decreased mobility  Visit Diagnosis: Stiffness of left ankle, not elsewhere classified  Localized edema     Problem List Patient Active Problem List   Diagnosis Date Noted  . Closed displaced bimalleolar fracture of left ankle 08/16/2016  . Long term current use of anticoagulant 03/05/2016  . PAF (paroxysmal atrial fibrillation) ([place]) 02/11/2016  . Essential hypertension 02/11/2016  . Asthma 02/11/2016    Madelyn Flavors PT 11/12/2016, 11:14 AM  [facility] [address] [place], [place], [address] Phone: [phone]   Fax:  [phone]  Name: KELISSA MERLIN MRN: [id number] Date of Birth: 29-Nov-1950

## 2016-11-14 ENCOUNTER — Ambulatory Visit: Payer: BLUE CROSS/BLUE SHIELD | Admitting: Physical Therapy

## 2016-11-14 DIAGNOSIS — M25572 Pain in left ankle and joints of left foot: Secondary | ICD-10-CM

## 2016-11-14 DIAGNOSIS — M25672 Stiffness of left ankle, not elsewhere classified: Secondary | ICD-10-CM

## 2016-11-14 DIAGNOSIS — R600 Localized edema: Secondary | ICD-10-CM

## 2016-11-14 NOTE — Therapy (Signed)
[facility] [place], [place], [address] Phone: [phone]   Fax:  [phone]  Physical Therapy Treatment  Patient Details  Name: [name] [name] MRN: [id number] Date of Birth: 1950/12/21 Referring Provider: Jean Rosenthal MD.  Encounter Date: 11/14/2016      PT End of Session - 11/14/16 1119    PT Start Time 1030   PT Stop Time 1127   PT Time Calculation (min) 57 min   Activity Tolerance Patient tolerated treatment well   Behavior During Therapy [facility] for tasks assessed/performed      Past Medical History:  Diagnosis Date  . A-fib ([place])   . Anemia    during pneumonia  . Arthritis   . Asthma   . Hypertension   . Pneumonia     Past Surgical History:  Procedure Laterality Date  . c section     1973 and 1983  . COLONOSCOPY    . ORIF ANKLE FRACTURE Left 08/16/2016  . ORIF ANKLE FRACTURE Left 08/16/2016   Procedure: OPEN REDUCTION INTERNAL FIXATION (ORIF) LEFT BIMALLEOLAR ANKLE FRACTURE;  Surgeon: Mcarthur Rossetti, MD;  Location: [facility];  Service: Orthopedics;  Laterality: Left;  . TUBAL LIGATION      There were no vitals filed for this visit.      Subjective Assessment - 11/14/16 1112    Subjective No new complaints.   Currently in Pain? Yes   Pain Score 2    Pain Location Foot   Pain Orientation Left   Pain Descriptors / Indicators Numbness   Pain Type Surgical pain                         [facility] Adult PT Treatment/Exercise - 11/14/16 0001      Exercises   Exercises Knee/Hip;Ankle     Knee/Hip Exercises: Aerobic   Nustep Level 5 x 15 minutes.     Knee/Hip Exercises: Standing   Rocker Board Limitations 5 minutes.   Other Standing Knee Exercises Dynadisc x 5 minutes   Other Standing Knee Exercises SLS on Airex balance pad x 3 minutes with UE parallel bar support.     Modalities   Modalities Lexicographer Location LT ANKLE.   Electrical Stimulation Action IFC x 15 minutes.   Electrical Stimulation Goals Edema     Vasopneumatic   Number Minutes Vasopneumatic  15 minutes   Vasopnuematic Location  --  LT ANKLE.   Vasopneumatic Pressure Medium                     PT Long Term Goals - 11/07/16 1341      PT LONG TERM GOAL #1   Title Independent with a HEP.   Time 8   Period Weeks   Status Achieved     PT LONG TERM GOAL #2   Title Increase left ankle dorsiflexion to 6- 8 degrees to normalize the patient's gait pattern   Time 8   Period Weeks   Status Achieved     PT LONG TERM GOAL #3   Title Increase left ankle strength to 4+ to 5/5 to increase stability for functional tasks   Time 8   Period Weeks   Status On-going     PT LONG TERM GOAL #4   Title Walk in clinic without assistive device 500 feet without deviation.   Time 8   Period Weeks  Status On-going     PT LONG TERM GOAL #5   Title Perform ADL's with pain not > 3/10.   Baseline 2/10 on average, but still uses pain pills   Time 8   Period Weeks   Status Partially Met             Patient will benefit from skilled therapeutic intervention in order to improve the following deficits and impairments:  Pain, Decreased activity tolerance, Increased edema, Decreased range of motion, Decreased mobility  Visit Diagnosis: Stiffness of left ankle, not elsewhere classified  Localized edema  Pain in left ankle and joints of left foot     Problem List Patient Active Problem List   Diagnosis Date Noted  . Closed displaced bimalleolar fracture of left ankle 08/16/2016  . Long term current use of anticoagulant 03/05/2016  . PAF (paroxysmal atrial fibrillation) ([place]) 02/11/2016  . Essential hypertension 02/11/2016  . Asthma 02/11/2016    Ronav Furney, [place] MPT 11/14/2016, 11:27 AM  [facility] [address] [place], [place], [address] Phone:  [phone]   Fax:  [phone]  Name: SURYA FOLDEN MRN: [id number] Date of Birth: February 25, 1951

## 2016-11-15 ENCOUNTER — Telehealth (INDEPENDENT_AMBULATORY_CARE_PROVIDER_SITE_OTHER): Payer: Self-pay | Admitting: Orthopaedic Surgery

## 2016-11-15 NOTE — Telephone Encounter (Signed)
Patient need a return to work note extended until her next appointment on 12/04/16, we had to r/s her appt from 11/22/16.  Please fax to [phone]

## 2016-11-16 ENCOUNTER — Encounter (INDEPENDENT_AMBULATORY_CARE_PROVIDER_SITE_OTHER): Payer: Self-pay

## 2016-11-16 NOTE — Telephone Encounter (Signed)
Faxed to number provided

## 2016-11-21 ENCOUNTER — Encounter: Payer: Self-pay | Admitting: Physical Therapy

## 2016-11-21 ENCOUNTER — Ambulatory Visit: Payer: BLUE CROSS/BLUE SHIELD | Admitting: Physical Therapy

## 2016-11-21 DIAGNOSIS — M25572 Pain in left ankle and joints of left foot: Secondary | ICD-10-CM

## 2016-11-21 DIAGNOSIS — R600 Localized edema: Secondary | ICD-10-CM

## 2016-11-21 DIAGNOSIS — M25672 Stiffness of left ankle, not elsewhere classified: Secondary | ICD-10-CM

## 2016-11-21 NOTE — Therapy (Signed)
[facility] [place], [place], [address] Phone: [phone]   Fax:  [phone]  Physical Therapy Treatment  Patient Details  Name: [name] [name] MRN: [id number] Date of Birth: Apr 09, 1951 Referring Provider: Jean Rosenthal MD.  Encounter Date: 11/21/2016      PT End of Session - 11/21/16 1111    Visit Number 14   Number of Visits 16   Date for PT Re-Evaluation 12/01/16   PT Start Time 1031   PT Stop Time 1127   PT Time Calculation (min) 56 min   Activity Tolerance Patient tolerated treatment well   Behavior During Therapy [facility] for tasks assessed/performed      Past Medical History:  Diagnosis Date  . A-fib ([place])   . Anemia    during pneumonia  . Arthritis   . Asthma   . Hypertension   . Pneumonia     Past Surgical History:  Procedure Laterality Date  . c section     1973 and 1983  . COLONOSCOPY    . ORIF ANKLE FRACTURE Left 08/16/2016  . ORIF ANKLE FRACTURE Left 08/16/2016   Procedure: OPEN REDUCTION INTERNAL FIXATION (ORIF) LEFT BIMALLEOLAR ANKLE FRACTURE;  Surgeon: Mcarthur Rossetti, MD;  Location: [facility];  Service: Orthopedics;  Laterality: Left;  . TUBAL LIGATION      There were no vitals filed for this visit.      Subjective Assessment - 11/21/16 1047    Subjective Patient reported some increased symptoms due to being up on feet a lot yesterday   Patient Stated Goals Get back to normal.   Currently in Pain? Yes   Pain Score 3    Pain Location Foot   Pain Orientation Left   Pain Descriptors / Indicators Numbness   Pain Type Surgical pain   Pain Onset More than a month ago   Pain Frequency Intermittent   Aggravating Factors  prolong standing   Pain Relieving Factors at rest and gentle activity                         [facility] Adult PT Treatment/Exercise - 11/21/16 0001      Knee/Hip Exercises: Aerobic   Nustep Level 5 x 15 minutes.     Knee/Hip Exercises: Standing    Rocker Board Limitations 5 minutes.   Other Standing Knee Exercises Dynadisc x 5 minutes     Electrical Stimulation   Electrical Stimulation Location LT ANKLE.   Electrical Stimulation Action IFC 1-_0  x46mn   Electrical Stimulation Goals Edema     Vasopneumatic   Number Minutes Vasopneumatic  15 minutes   Vasopnuematic Location  Ankle   Vasopneumatic Pressure Medium     Ankle Exercises: Seated   Other Seated Ankle Exercises ankle isolator 1 1/2# 3x10, and circles 2x10     Ankle Exercises: Standing   Other Standing Ankle Exercises 6 in step up and side step 2x10                     PT Long Term Goals - 11/07/16 1341      PT LONG TERM GOAL #1   Title Independent with a HEP.   Time 8   Period Weeks   Status Achieved     PT LONG TERM GOAL #2   Title Increase left ankle dorsiflexion to 6- 8 degrees to normalize the patient's gait pattern   Time 8   Period Weeks   Status  Achieved     PT LONG TERM GOAL #3   Title Increase left ankle strength to 4+ to 5/5 to increase stability for functional tasks   Time 8   Period Weeks   Status On-going     PT LONG TERM GOAL #4   Title Walk in clinic without assistive device 500 feet without deviation.   Time 8   Period Weeks   Status On-going     PT LONG TERM GOAL #5   Title Perform ADL's with pain not > 3/10.   Baseline 2/10 on average, but still uses pain pills   Time 8   Period Weeks   Status Partially Met               Plan - 11/21/16 1112    Clinical Impression Statement Patient progressing with all activities today and reported no increased symptoms. Patient tolerated all exercises and able to progress with no difficulty. Patient feels up to 80% improvement overall, yet ongoing numbness and symptoms in left foot. Patient goals ongoing due to strength deficts.   Rehab Potential Excellent   PT Frequency 2x / week   PT Duration 8 weeks   PT Treatment/Interventions ADLs/Self Care Home  Management;Cryotherapy;Electrical Stimulation;Moist Heat;Therapeutic activities;Therapeutic exercise;Neuromuscular re-education;Patient/family education;Passive range of motion;Manual techniques;Vasopneumatic Device   PT Next Visit Plan Continue L ankle strengthening and LLE strengthening to improve function with modalities PRN for pain/edema per MPT POC. (MD. Ninfa Linden 12/04/16)   Consulted and Agree with Plan of Care Patient      Patient will benefit from skilled therapeutic intervention in order to improve the following deficits and impairments:  Pain, Decreased activity tolerance, Increased edema, Decreased range of motion, Decreased mobility  Visit Diagnosis: Stiffness of left ankle, not elsewhere classified  Localized edema  Pain in left ankle and joints of left foot     Problem List Patient Active Problem List   Diagnosis Date Noted  . Closed displaced bimalleolar fracture of left ankle 08/16/2016  . Long term current use of anticoagulant 03/05/2016  . PAF (paroxysmal atrial fibrillation) ([facility]) 02/11/2016  . Essential hypertension 02/11/2016  . Asthma 02/11/2016    Phillips Climes, PTA  11/21/2016, 11:54 AM  [facility] [place], [place], [address] Phone: [phone]   Fax:  [phone]  Name: [name] [name] MRN: [id number] Date of Birth: [birth year]

## 2016-11-22 ENCOUNTER — Ambulatory Visit (INDEPENDENT_AMBULATORY_CARE_PROVIDER_SITE_OTHER): Payer: BLUE CROSS/BLUE SHIELD | Admitting: Orthopaedic Surgery

## 2016-11-23 ENCOUNTER — Ambulatory Visit: Payer: BLUE CROSS/BLUE SHIELD | Admitting: Physical Therapy

## 2016-11-23 DIAGNOSIS — M25572 Pain in left ankle and joints of left foot: Secondary | ICD-10-CM

## 2016-11-23 DIAGNOSIS — M25672 Stiffness of left ankle, not elsewhere classified: Secondary | ICD-10-CM

## 2016-11-23 DIAGNOSIS — R600 Localized edema: Secondary | ICD-10-CM

## 2016-11-23 NOTE — Therapy (Signed)
[facility] [place], [place], [address] Phone: [phone]   Fax:  [phone]  Physical Therapy Treatment  Patient Details  Name: [name] [name] MRN: [id number] Date of Birth: 1951/09/30 Referring Provider: Jean Rosenthal MD.  Encounter Date: 11/23/2016      PT End of Session - 11/23/16 1305    Visit Number 15   Number of Visits 16   Date for PT Re-Evaluation 12/01/16   PT Start Time 1030   PT Stop Time 1124   PT Time Calculation (min) 54 min      Past Medical History:  Diagnosis Date  . A-fib ([place])   . Anemia    during pneumonia  . Arthritis   . Asthma   . Hypertension   . Pneumonia     Past Surgical History:  Procedure Laterality Date  . c section     1973 and 1983  . COLONOSCOPY    . ORIF ANKLE FRACTURE Left 08/16/2016  . ORIF ANKLE FRACTURE Left 08/16/2016   Procedure: OPEN REDUCTION INTERNAL FIXATION (ORIF) LEFT BIMALLEOLAR ANKLE FRACTURE;  Surgeon: Mcarthur Rossetti, MD;  Location: [facility];  Service: Orthopedics;  Laterality: Left;  . TUBAL LIGATION      There were no vitals filed for this visit.      Subjective Assessment - 11/23/16 1305    Subjective No new complaints.   Pain Score 3    Pain Location Foot   Pain Orientation Left   Pain Descriptors / Indicators Numbness   Pain Type Surgical pain   Pain Onset More than a month ago     Treatment:  Nustep level 4 x 17 minutes f/b rockerboard x 4 minutes f/b dynadisc x 4 minutes f/b ankle isolator x 3 minutes with 2# in all directions f/b medium vasopneumatic x 15 minutes.  Excellent job today.                                 PT Long Term Goals - 11/07/16 1341      PT LONG TERM GOAL #1   Title Independent with a HEP.   Time 8   Period Weeks   Status Achieved     PT LONG TERM GOAL #2   Title Increase left ankle dorsiflexion to 6- 8 degrees to normalize the patient's gait pattern   Time 8   Period  Weeks   Status Achieved     PT LONG TERM GOAL #3   Title Increase left ankle strength to 4+ to 5/5 to increase stability for functional tasks   Time 8   Period Weeks   Status On-going     PT LONG TERM GOAL #4   Title Walk in clinic without assistive device 500 feet without deviation.   Time 8   Period Weeks   Status On-going     PT LONG TERM GOAL #5   Title Perform ADL's with pain not > 3/10.   Baseline 2/10 on average, but still uses pain pills   Time 8   Period Weeks   Status Partially Met             Patient will benefit from skilled therapeutic intervention in order to improve the following deficits and impairments:  Pain, Decreased activity tolerance, Increased edema, Decreased range of motion, Decreased mobility  Visit Diagnosis: Stiffness of left ankle, not elsewhere classified  Localized edema  Pain in left  ankle and joints of left foot     Problem List Patient Active Problem List   Diagnosis Date Noted  . Closed displaced bimalleolar fracture of left ankle 08/16/2016  . Long term current use of anticoagulant 03/05/2016  . PAF (paroxysmal atrial fibrillation) ([place]) 02/11/2016  . Essential hypertension 02/11/2016  . Asthma 02/11/2016    Hayla Hinger, [place] MPT 11/23/2016, 1:08 PM  [facility] [address] [place], [place], [address] Phone: [phone]   Fax:  [phone]  Name: VERNER KOPISCHKE MRN: [id number] Date of Birth: 08/06/1951

## 2016-11-27 ENCOUNTER — Encounter: Payer: Self-pay | Admitting: Physical Therapy

## 2016-11-27 ENCOUNTER — Ambulatory Visit: Payer: BLUE CROSS/BLUE SHIELD | Attending: Orthopaedic Surgery | Admitting: Physical Therapy

## 2016-11-27 DIAGNOSIS — M25672 Stiffness of left ankle, not elsewhere classified: Secondary | ICD-10-CM | POA: Diagnosis not present

## 2016-11-27 DIAGNOSIS — M25572 Pain in left ankle and joints of left foot: Secondary | ICD-10-CM | POA: Insufficient documentation

## 2016-11-27 DIAGNOSIS — R600 Localized edema: Secondary | ICD-10-CM | POA: Diagnosis present

## 2016-11-27 NOTE — Therapy (Signed)
[facility] [address] [place], [place], [address] Phone: [phone]   Fax:  [phone]  Physical Therapy Treatment  Patient Details  Name: [name] [name] MRN: [id number] Date of Birth: July 07, 1951 Referring Provider: Doneen Poisson MD.  Encounter Date: 11/27/2016      PT End of Session - 11/27/16 1038    Visit Number 16   Number of Visits 16   Date for PT Re-Evaluation 12/01/16   PT Start Time 1028   PT Stop Time 1129   PT Time Calculation (min) 61 min   Activity Tolerance Patient tolerated treatment well   Behavior During Therapy [facility] for tasks assessed/performed      Past Medical History:  Diagnosis Date  . A-fib ([facility])   . Anemia    during pneumonia  . Arthritis   . Asthma   . Hypertension   . Pneumonia     Past Surgical History:  Procedure Laterality Date  . c section     1973 and 1983  . COLONOSCOPY    . ORIF ANKLE FRACTURE Left 08/16/2016  . ORIF ANKLE FRACTURE Left 08/16/2016   Procedure: OPEN REDUCTION INTERNAL FIXATION (ORIF) LEFT BIMALLEOLAR ANKLE FRACTURE;  Surgeon: Kathryne Hitch, MD;  [facility];  Service: Orthopedics;  Laterality: Left;  . TUBAL LIGATION      There were no vitals filed for this visit.      Subjective Assessment - 11/27/16 1038    Subjective Reports that she returns to MD 12/04/2016. Reports tightness that did not start until she got outdoors this morning.   Patient Stated Goals Get back to normal.   Currently in Pain? Yes   Pain Score 4    Pain Location Ankle   Pain Orientation Left   Pain Descriptors / Indicators Tightness   Pain Type Surgical pain   Pain Onset More than a month ago   Aggravating Factors  Cold enviroment            [facility] PT Assessment - 11/27/16 0001      Assessment   Medical Diagnosis Lt ankle bimalleolr fracture.   Onset Date/Surgical Date 08/05/16   Next MD Visit 12/04/2016                     [facility] Adult PT  Treatment/Exercise - 11/27/16 0001      Knee/Hip Exercises: Aerobic   Nustep Level 5 x 20 minutes.     Modalities   Modalities Proofreader Location L ankle   Electrical Stimulation Action Pre-Mod   Electrical Stimulation Parameters 80-150 hz x15 min   Electrical Stimulation Goals Edema;Pain     Vasopneumatic   Number Minutes Vasopneumatic  15 minutes   Vasopnuematic Location  Ankle   Vasopneumatic Pressure Medium   Vasopneumatic Temperature  56     Ankle Exercises: Standing   SLS LLE SLS with fingertip assist 3x max trial   Rocker Board 5 minutes   Heel Raises 20 reps   Toe Raise 20 reps   Other Standing Ankle Exercises L ankle dynadisc PF/DF, Inv/Ev x20 reps each     Ankle Exercises: Seated   ABC's 1 rep  1# ankle isolator   Other Seated Ankle Exercises L ankle isolator 1# DF/Inv/Ev x30 reps                     PT Long Term Goals - 11/27/16 1132  PT LONG TERM GOAL #1   Title Independent with a HEP.   Time 8   Period Weeks   Status Achieved     PT LONG TERM GOAL #2   Title Increase left ankle dorsiflexion to 6- 8 degrees to normalize the patient's gait pattern   Time 8   Period Weeks   Status Achieved     PT LONG TERM GOAL #3   Title Increase left ankle strength to 4+ to 5/5 to increase stability for functional tasks   Time 8   Period Weeks   Status On-going     PT LONG TERM GOAL #4   Title Walk in clinic without assistive device 500 feet without deviation.   Time 8   Period Weeks   Status On-going     PT LONG TERM GOAL #5   Title Perform ADL's with pain not > 3/10.   Baseline 2/10 on average, but still uses pain pills   Time 8   Period Weeks   Status Achieved               Plan - 11/27/16 1120    Clinical Impression Statement Patient continues to progress with therapeutic exercise although instability noted with LLE SLS. Patient reported  difficulty with ankle isolator in previous treatment so ankle isolator resistance decreased slightly. Patient continues to require tactile and VC to reduce knee participation with ankle exercises in sitting. Patient experienced tightness upon arrival in L ankle/foot but only minimal inflammation noted in L ankle upon observation especially around B malleoli and incisions. Normal modalities response noted following removal of the modalites. Able to achieve LT goal regarding L ankle pain with ADLs, LT goals regarding ambulation and ankle strength continued at this time.   Rehab Potential Excellent   PT Frequency 2x / week   PT Duration 8 weeks   PT Treatment/Interventions ADLs/Self Care Home Management;Cryotherapy;Electrical Stimulation;Moist Heat;Therapeutic activities;Therapeutic exercise;Neuromuscular re-education;Patient/family education;Passive range of motion;Manual techniques;Vasopneumatic Device   PT Next Visit Plan Continue L ankle strengthening and LLE strengthening to improve function with modalities PRN for pain/edema per MPT POC. (MD. Magnus IvanBlackman 12/04/16)   Consulted and Agree with Plan of Care Patient      Patient will benefit from skilled therapeutic intervention in order to improve the following deficits and impairments:  Pain, Decreased activity tolerance, Increased edema, Decreased range of motion, Decreased mobility  Visit Diagnosis: Stiffness of left ankle, not elsewhere classified  Localized edema  Pain in left ankle and joints of left foot     Problem List Patient Active Problem List   Diagnosis Date Noted  . Closed displaced bimalleolar fracture of left ankle 08/16/2016  . Long term current use of anticoagulant 03/05/2016  . PAF (paroxysmal atrial fibrillation) ([facility]) 02/11/2016  . Essential hypertension 02/11/2016  . Asthma 02/11/2016   Florence CannerKelsey Parsons, PTA 11/27/16 11:36 AM  [facility] [address] [place], [place],  [address] Phone: [phone]   Fax:  [phone]  Name: [name] RobinsonsRuth A Mcnamee MRN: [id number] Date of Birth: [birth year]

## 2016-11-29 ENCOUNTER — Encounter: Payer: Self-pay | Admitting: Physical Therapy

## 2016-11-29 ENCOUNTER — Ambulatory Visit: Payer: BLUE CROSS/BLUE SHIELD | Admitting: Physical Therapy

## 2016-11-29 DIAGNOSIS — M25572 Pain in left ankle and joints of left foot: Secondary | ICD-10-CM

## 2016-11-29 DIAGNOSIS — M25672 Stiffness of left ankle, not elsewhere classified: Secondary | ICD-10-CM | POA: Diagnosis not present

## 2016-11-29 DIAGNOSIS — R600 Localized edema: Secondary | ICD-10-CM

## 2016-11-29 NOTE — Therapy (Signed)
[facility] [address] [place], [place], [address] Phone: [phone]   Fax:  [phone]  Physical Therapy Treatment  Patient Details  Name: LAURANNE BEYERSDORF MRN: [id number] Date of Birth: 1951/11/18 Referring Provider: Doneen Poisson MD.  Encounter Date: 11/29/2016      PT End of Session - 11/29/16 1035    Visit Number 17   Number of Visits 24   Date for PT Re-Evaluation 01/30/17   PT Start Time 1029   PT Stop Time 1129   PT Time Calculation (min) 60 min   Activity Tolerance Patient tolerated treatment well   Behavior During Therapy [facility] for tasks assessed/performed      Past Medical History:  Diagnosis Date  . A-fib ([facility])   . Anemia    during pneumonia  . Arthritis   . Asthma   . Hypertension   . Pneumonia     Past Surgical History:  Procedure Laterality Date  . c section     1973 and 1983  . COLONOSCOPY    . ORIF ANKLE FRACTURE Left 08/16/2016  . ORIF ANKLE FRACTURE Left 08/16/2016   Procedure: OPEN REDUCTION INTERNAL FIXATION (ORIF) LEFT BIMALLEOLAR ANKLE FRACTURE;  Surgeon: Kathryne Hitch, MD;  [facility];  Service: Orthopedics;  Laterality: Left;  . TUBAL LIGATION      There were no vitals filed for this visit.      Subjective Assessment - 11/29/16 1031    Subjective Patient feeling "tired and slow moving today" yet her ankle is overall improveing slowly.   Patient Stated Goals Get back to normal.   Currently in Pain? Yes   Pain Score 3    Pain Location Ankle   Pain Orientation Left   Pain Descriptors / Indicators Tightness   Pain Type Surgical pain   Pain Onset More than a month ago   Aggravating Factors  weather and non movement   Pain Relieving Factors at rest and gentle exercises                         [facility] Adult PT Treatment/Exercise - 11/29/16 0001      Knee/Hip Exercises: Aerobic   Nustep Level 5 x 20 min UE/LE      Electrical Stimulation   Electrical  Stimulation Location L ankle   Electrical Stimulation Action premod   Electrical Stimulation Parameters 80-150hz  x60min   Electrical Stimulation Goals Edema;Pain     Vasopneumatic   Number Minutes Vasopneumatic  15 minutes   Vasopnuematic Location  Ankle   Vasopneumatic Pressure Medium     Ankle Exercises: Standing   SLS LLE SLS with fingertip assist 3x max trial   Rocker Board 5 minutes   Heel Raises 20 reps   Toe Raise 20 reps   Other Standing Ankle Exercises L ankle dynadisc PF/DF, Inv/Ev x20 reps each   Other Standing Ankle Exercises 6 in step up and side step 2x10     Ankle Exercises: Seated   ABC's 1 rep  1# ankle isolator   Other Seated Ankle Exercises L ankle isolator 1# DF/Inv/Ev x30 reps                     PT Long Term Goals - 11/27/16 1132      PT LONG TERM GOAL #1   Title Independent with a HEP.   Time 8   Period Weeks   Status Achieved     PT LONG  TERM GOAL #2   Title Increase left ankle dorsiflexion to 6- 8 degrees to normalize the patient's gait pattern   Time 8   Period Weeks   Status Achieved     PT LONG TERM GOAL #3   Title Increase left ankle strength to 4+ to 5/5 to increase stability for functional tasks   Time 8   Period Weeks   Status On-going     PT LONG TERM GOAL #4   Title Walk in clinic without assistive device 500 feet without deviation.   Time 8   Period Weeks   Status On-going     PT LONG TERM GOAL #5   Title Perform ADL's with pain not > 3/10.   Baseline 2/10 on average, but still uses pain pills   Time 8   Period Weeks   Status Achieved               Plan - 11/29/16 1104    Clinical Impression Statement Patient tolerated treatment well today. Patient has reported no increased discomfort during or after treatment. Patient has slight antalgic gait and only uses a assist device if walking a long distance. Patient current goals ongoing due to strength deficts.   Rehab Potential Excellent   PT Frequency 2x  / week   PT Duration 8 weeks   PT Treatment/Interventions ADLs/Self Care Home Management;Cryotherapy;Electrical Stimulation;Moist Heat;Therapeutic activities;Therapeutic exercise;Neuromuscular re-education;Patient/family education;Passive range of motion;Manual techniques;Vasopneumatic Device   PT Next Visit Plan Continue L ankle strengthening and LLE strengthening to improve function with modalities PRN for pain/edema per MPT POC. (MD. Magnus IvanBlackman 12/04/16)      Patient will benefit from skilled therapeutic intervention in order to improve the following deficits and impairments:  Pain, Decreased activity tolerance, Increased edema, Decreased range of motion, Decreased mobility  Visit Diagnosis: Stiffness of left ankle, not elsewhere classified  Localized edema  Pain in left ankle and joints of left foot     Problem List Patient Active Problem List   Diagnosis Date Noted  . Closed displaced bimalleolar fracture of left ankle 08/16/2016  . Long term current use of anticoagulant 03/05/2016  . PAF (paroxysmal atrial fibrillation) ([facility]) 02/11/2016  . Essential hypertension 02/11/2016  . Asthma 02/11/2016    Dakarri Kessinger P, PTA 11/29/2016, 11:29 AM  [facility] [address] [place], [place], [address] Phone: [phone]   Fax:  [phone]  Name: Mathews RobinsonsRuth A Chalmers MRN: [id number] Date of Birth: May 29, 1951

## 2016-12-03 ENCOUNTER — Ambulatory Visit: Payer: BLUE CROSS/BLUE SHIELD | Admitting: Physical Therapy

## 2016-12-03 ENCOUNTER — Encounter: Payer: Self-pay | Admitting: Physical Therapy

## 2016-12-03 DIAGNOSIS — M25672 Stiffness of left ankle, not elsewhere classified: Secondary | ICD-10-CM | POA: Diagnosis not present

## 2016-12-03 DIAGNOSIS — M25572 Pain in left ankle and joints of left foot: Secondary | ICD-10-CM

## 2016-12-03 DIAGNOSIS — R600 Localized edema: Secondary | ICD-10-CM

## 2016-12-03 NOTE — Therapy (Addendum)
[facility] [place], [place], [address] Phone: [phone]   Fax:  [phone]  Physical Therapy Treatment  Patient Details  Name: [name] [name] MRN: [id number] Date of Birth: [birth year] Referring Provider: Jean Rosenthal MD.  Encounter Date: 12/03/2016      PT End of Session - 12/03/16 1109    Visit Number 18   Number of Visits 24   Date for PT Re-Evaluation 01/30/17   PT Start Time 1031   PT Stop Time 1127   PT Time Calculation (min) 56 min   Activity Tolerance Patient tolerated treatment well   Behavior During Therapy [facility] for tasks assessed/performed      Past Medical History:  Diagnosis Date  . A-fib ([place])   . Anemia    during pneumonia  . Arthritis   . Asthma   . Hypertension   . Pneumonia     Past Surgical History:  Procedure Laterality Date  . c section     1973 and 1983  . COLONOSCOPY    . ORIF ANKLE FRACTURE Left 08/16/2016  . ORIF ANKLE FRACTURE Left 08/16/2016   Procedure: OPEN REDUCTION INTERNAL FIXATION (ORIF) LEFT BIMALLEOLAR ANKLE FRACTURE;  Surgeon: Mcarthur Rossetti, MD;  Location: [facility];  Service: Orthopedics;  Laterality: Left;  . TUBAL LIGATION      There were no vitals filed for this visit.      Subjective Assessment - 12/03/16 1042    Subjective Patient continues to improve improvement slowly   Patient Stated Goals Get back to normal.   Pain Score 3    Pain Location Ankle   Pain Orientation Left   Pain Descriptors / Indicators Tightness   Pain Type Surgical pain   Pain Onset More than a month ago   Aggravating Factors  weather and non movement stiffness   Pain Relieving Factors at rest and gentle exercise            [facility] PT Assessment - 12/03/16 0001      Strength   Strength Assessment Site Ankle   Right/Left Ankle Left   Left Ankle Dorsiflexion 4+/5   Left Ankle Inversion 4/5   Left Ankle Eversion 4+/5                     [facility] Adult PT  Treatment/Exercise - 12/03/16 0001      Knee/Hip Exercises: Aerobic   Stationary Bike L2 61mn     Electrical Stimulation   Electrical Stimulation Location L ankle   Electrical Stimulation Action premod   Electrical Stimulation Parameters 80-150hz  x163m   Electrical Stimulation Goals Edema;Pain     Vasopneumatic   Number Minutes Vasopneumatic  15 minutes   Vasopnuematic Location  Ankle   Vasopneumatic Pressure Medium     Ankle Exercises: Standing   SLS LLE SLS with fingertip assist 3x max trial   Rocker Board 5 minutes   Heel Raises 20 reps   Toe Raise 20 reps   Other Standing Ankle Exercises L ankle dynadisc PF/DF, Inv/Ev x20 reps each   Other Standing Ankle Exercises 6 in step up and side step 2x10     Ankle Exercises: Seated   ABC's 1 rep  ABC's   Other Seated Ankle Exercises L ankle isolator 1# DF/Inv/Ev x30 reps                     PT Long Term Goals - 12/03/16 1116      PT  LONG TERM GOAL #1   Title Independent with a HEP.   Time 8   Period Weeks   Status Achieved     PT LONG TERM GOAL #2   Title Increase left ankle dorsiflexion to 6- 8 degrees to normalize the patient's gait pattern   Time 8   Period Weeks   Status Achieved     PT LONG TERM GOAL #3   Title Increase left ankle strength to 4+ to 5/5 to increase stability for functional tasks   Time 8   Period Weeks   Status On-going     PT LONG TERM GOAL #4   Title Walk in clinic without assistive device 500 feet without deviation.   Time 8   Period Weeks   Status Achieved  12/03/16     PT LONG TERM GOAL #5   Title Perform ADL's with pain not > 3/10.   Baseline 2/10 on average, but still uses pain pills   Time 8   Period Weeks   Status Achieved               Plan - 12/03/16 1117    Clinical Impression Statement Patient tolerated treatment well today and had no reported increased symptoms. Patient has not used a assistive device and has done well without. Patient has little  symptoms remaining and is able to sleep undisturbed at this time. Patient has met LTG #4 remaining goal ongoing due to strength defict.   Rehab Potential Excellent   PT Frequency 2x / week   PT Duration 8 weeks   PT Treatment/Interventions ADLs/Self Care Home Management;Cryotherapy;Electrical Stimulation;Moist Heat;Therapeutic activities;Therapeutic exercise;Neuromuscular re-education;Patient/family education;Passive range of motion;Manual techniques;Vasopneumatic Device   PT Next Visit Plan Continue per MD discression. (MD. Ninfa Linden 12/04/16)   Consulted and Agree with Plan of Care Patient      Patient will benefit from skilled therapeutic intervention in order to improve the following deficits and impairments:  Pain, Decreased activity tolerance, Increased edema, Decreased range of motion, Decreased mobility  Visit Diagnosis: Stiffness of left ankle, not elsewhere classified  Localized edema  Pain in left ankle and joints of left foot     Problem List Patient Active Problem List   Diagnosis Date Noted  . Closed displaced bimalleolar fracture of left ankle 08/16/2016  . Long term current use of anticoagulant 03/05/2016  . PAF (paroxysmal atrial fibrillation) ([place]) 02/11/2016  . Essential hypertension 02/11/2016  . Asthma 02/11/2016   Ladean Raya, PTA 12/03/16 12:30 PM Mali Applegate MPT [facility] [address] [place], [place], [address] Phone: [phone]   Fax:  [phone]  Name: [name] [name] MRN: [id number] Date of Birth: 01/31/1951   PHYSICAL THERAPY DISCHARGE SUMMARY  Visits from Start of Care: 18.  Current functional level related to goals / functional outcomes: See above.   Remaining deficits: Goal #3 unmet.   Education / Equipment: HEP. Plan: Patient agrees to discharge.  Patient goals were partially met. Patient is being discharged due to meeting the stated rehab goals.  ?????         Mali  Applegate MPT

## 2016-12-04 ENCOUNTER — Ambulatory Visit (INDEPENDENT_AMBULATORY_CARE_PROVIDER_SITE_OTHER): Payer: BLUE CROSS/BLUE SHIELD | Admitting: Orthopaedic Surgery

## 2016-12-04 DIAGNOSIS — S82842D Displaced bimalleolar fracture of left lower leg, subsequent encounter for closed fracture with routine healing: Secondary | ICD-10-CM | POA: Diagnosis not present

## 2016-12-04 NOTE — Progress Notes (Signed)
The patient is a very pleasant 66 year old female well-known to me. She is 3-1/2 months status post a reduction and fixation of a bimalleolar ankle fracture of the left ankle. 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 She's been to physical therapy and ambulates without an assistive device other than wearing an ASO around that ankle. No children therapy correlated she's been doing well. She feels that she can return to work starting next week.  On examination of her left ankle both incisions are healed nicely. Her range of motion is almost entirely full and she has 5 out of 5 strength of the ankle. She has some slight numbness of her foot but the foot is well perfused. I do not need to x-ray her today.  This point I feel that she has healed this ankle fracture. She does understand that she may develop swelling from time to time as well as posterior medical arthritic changes of the ankle joint. She will transition out of the ASO when she is comfortable doing so. If she has any problems with her ankle in the future she should not hesitate to come see us.

## 2016-12-05 ENCOUNTER — Encounter: Payer: BLUE CROSS/BLUE SHIELD | Admitting: Physical Therapy

## 2017-03-01 ENCOUNTER — Other Ambulatory Visit: Payer: Self-pay | Admitting: Internal Medicine

## 2017-03-01 NOTE — Telephone Encounter (Signed)
REFILL 

## 2017-03-04 ENCOUNTER — Other Ambulatory Visit: Payer: Self-pay | Admitting: Internal Medicine

## 2017-03-04 MED ORDER — APIXABAN 5 MG PO TABS: 5.0000 mg | ORAL_TABLET | Freq: Two times a day (BID) | ORAL | 6 refills | Status: DC

## 2017-03-04 NOTE — Telephone Encounter (Signed)
Medication Detail    Disp Refills Start End   CARTIA XT 120 MG 24 hr capsule 90 capsule 3 03/01/2017    Sig: TAKE ONE CAPSULE BY MOUTH ONCE DAILY   E-Prescribing Status: Receipt confirmed by pharmacy (03/01/2017 11:04 AM EDT)   Pharmacy   [id number] - [place], [place] - [address]   Eliquis refilled.

## 2017-03-04 NOTE — Telephone Encounter (Signed)
New Message     *STAT* If patient is at the pharmacy, call can be transferred to refill team.   1. Which medications need to be refilled? (please list name of each medication and dose if known)  CARTIA XT 120 MG 24 hr capsule TAKE ONE CAPSULE BY MOUTH ONCE DAILY   apixaban (ELIQUIS) 5 MG TABS tablet Take 1 tablet (5 mg total) by mouth 2 (two) times daily.      2. Which pharmacy/location (including street and city if local pharmacy) is medication to be sent to?  [facility] in [place]  3. Do they need a 30 day or 90 day supply? 30

## 2017-08-29 DIAGNOSIS — J398 Other specified diseases of upper respiratory tract: Secondary | ICD-10-CM | POA: Diagnosis not present

## 2017-08-29 DIAGNOSIS — I10 Essential (primary) hypertension: Secondary | ICD-10-CM | POA: Diagnosis not present

## 2017-08-29 DIAGNOSIS — I4891 Unspecified atrial fibrillation: Secondary | ICD-10-CM | POA: Diagnosis not present

## 2017-09-23 DIAGNOSIS — I10 Essential (primary) hypertension: Secondary | ICD-10-CM | POA: Diagnosis not present

## 2017-09-23 DIAGNOSIS — E785 Hyperlipidemia, unspecified: Secondary | ICD-10-CM | POA: Diagnosis not present

## 2017-09-23 DIAGNOSIS — Z23 Encounter for immunization: Secondary | ICD-10-CM | POA: Diagnosis not present

## 2017-09-24 ENCOUNTER — Ambulatory Visit (INDEPENDENT_AMBULATORY_CARE_PROVIDER_SITE_OTHER): Payer: BLUE CROSS/BLUE SHIELD | Admitting: Internal Medicine

## 2017-09-24 VITALS — BP 136/80 | HR 64 | Ht 64.0 in | Wt 218.0 lb

## 2017-09-24 DIAGNOSIS — I480 Paroxysmal atrial fibrillation: Secondary | ICD-10-CM | POA: Diagnosis not present

## 2017-09-24 DIAGNOSIS — Z7901 Long term (current) use of anticoagulants: Secondary | ICD-10-CM | POA: Diagnosis not present

## 2017-09-24 DIAGNOSIS — I10 Essential (primary) hypertension: Secondary | ICD-10-CM | POA: Diagnosis not present

## 2017-09-24 MED ORDER — DILTIAZEM HCL ER COATED BEADS 120 MG PO CP24: 120.0000 mg | ORAL_CAPSULE | Freq: Every day | ORAL | 3 refills | Status: DC

## 2017-09-24 MED ORDER — APIXABAN 5 MG PO TABS: 5.0000 mg | ORAL_TABLET | Freq: Two times a day (BID) | ORAL | 11 refills | Status: DC

## 2017-09-24 NOTE — Progress Notes (Signed)
OFFICE NOTE  Chief Complaint:  Routine follow-up  Primary Care Physician: Mirna Mires, MD  HPI:  [name] [name] is a 66 y.o. female with a past medical history significant for hypertension and asthma. She presented with onset of aching in her left shoulder and heart racing. She took an aspirin without any improvement and called EMS. On arrival she was found to be in A. fib with rapid ventricular response at 180 bpm. She was given IV diltiazem and spontaneously converted to sinus rhythm however had a short recurrence of A. fib in the ER and then again converted back to sinus rhythm. This patients CHA2DS2-VASc Score and unadjusted Ischemic Stroke Rate (% per year) is equal to 2.2 % stroke rate/year from a score of 2. Above score calculated as 1 point each if present [CHF, HTN, DM, Vascular=MI/PAD/Aortic Plaque, Age if 65-74, or Female] Above score calculated as 2 points each if present [Age > 75, or Stroke/TIA/TE]  [name] [name] returns today for follow-up. She underwent an exercise treadmill stress test which was negative for ischemia. Although the report indicated she did not reach target, I personally reviewed it and indicated her heart rate was greater than 85% max predicted heart rate. Therefore this was an adequate study and low risk. Her echocardiogram demonstrates normal LV function with an EF of 60-65%, there is trivial aortic insufficiency and it was read as grade 2 diastolic dysfunction. Is not clear whether she may eventually been in A. fib or not had recovery of atrial mechanical activity at the time therefore I question the degree of diastolic dysfunction. She denies any chest pain, or shortness of breath with exertion. Blood pressure is fairly well-controlled today. She is tolerating diltiazem which was a switch from verapamil which she was previously taking. She is also on Eliquis 5 mg twice a day without any bleeding problems. Heart rate is in the 60s today and regular.  08/31/2016  [name] returns today for follow-up. Unfortunately she recently broke her left lower leg and underwent surgery to repair that. She is in a walking boot. She started some physical therapy for that. Fortunately she has not had any recurrent atrial fibrillation. She did discontinue hear few days prior to surgery and restart it afterwards. Heart rate and bloo pressure are well controlled.  09/24/2017  [name] [name] was seen today in follow-up.  Overall she seems to be doing well.  About a year ago she had an ankle fracture and is still wearing a support brace.  She says she is not as active as she was.  She denies any recurrent A. fib.  Blood pressure appears to be well controlled.  She continues on Eliquis without any significant bleeding problems.  PMHx:  Past Medical History:  Diagnosis Date  . A-fib ([facility])   . Anemia    during pneumonia  . Arthritis   . Asthma   . Hypertension   . Pneumonia     Past Surgical History:  Procedure Laterality Date  . c section     1973 and 1983  . COLONOSCOPY    . ORIF ANKLE FRACTURE Left 08/16/2016  . ORIF ANKLE FRACTURE Left 08/16/2016   Procedure: OPEN REDUCTION INTERNAL FIXATION (ORIF) LEFT BIMALLEOLAR ANKLE FRACTURE;  Surgeon: Kathryne Hitch, MD;  Location: [facility];  Service: Orthopedics;  Laterality: Left;  . TUBAL LIGATION      FAMHx:  Family History  Problem Relation Age of Onset  . Atrial fibrillation Mother   . Hypertension Mother   .  Asthma Father   . Heart attack Father     SOCHx:   reports that she has never smoked. She has never used smokeless tobacco. She reports that she does not drink alcohol or use drugs.  ALLERGIES:  No Known Allergies  ROS: Pertinent items noted in HPI and remainder of comprehensive ROS otherwise negative.  HOME MEDS: Current Outpatient Prescriptions  Medication Sig Dispense Refill  . acetaminophen (TYLENOL) 500 MG tablet Take 1,000 mg by mouth every 6 (six) hours as needed for mild pain.    Marland Kitchen. albuterol  (PROVENTIL HFA;VENTOLIN HFA) 108 (90 Base) MCG/ACT inhaler Inhale 2 puffs into the lungs every 6 (six) hours as needed for wheezing or shortness of breath.     Marland Kitchen. apixaban (ELIQUIS) 5 MG TABS tablet Take 1 tablet (5 mg total) by mouth 2 (two) times daily. 60 tablet 11  . B Complex-C (B-COMPLEX WITH VITAMIN C) tablet Take 1 tablet by mouth daily.    . busPIRone (BUSPAR) 5 MG tablet Take 5 mg by mouth 2 (two) times daily.     . cholecalciferol (VITAMIN D) 1000 units tablet Take 2,000 Units by mouth daily.    Marland Kitchen. diltiazem (CARTIA XT) 120 MG 24 hr capsule Take 1 capsule (120 mg total) by mouth daily. 90 capsule 3  . losartan-hydrochlorothiazide (HYZAAR) 100-25 MG tablet Take 1 tablet by mouth daily. 30 tablet 9  . Multiple Vitamin (MULTIVITAMIN WITH MINERALS) TABS tablet Take 1 tablet by mouth daily.     No current facility-administered medications for this visit.     LABS/IMAGING: No results found for this or any previous visit (from the past 48 hour(s)). No results found.  WEIGHTS: Wt Readings from Last 3 Encounters:  09/24/17 218 lb (98.9 kg)  08/31/16 194 lb 3.2 oz (88.1 kg)  08/16/16 195 lb (88.5 kg)    VITALS: BP 136/80   Pulse 64   Ht 5\' 4"  (1.626 m)   Wt 218 lb (98.9 kg)   BMI 37.42 kg/m   EXAM: General appearance: alert and no distress Neck: no carotid bruit and no JVD Lungs: clear to auscultation bilaterally Heart: regular rate and rhythm, S1, S2 normal, no murmur, click, rub or gallop Abdomen: soft, non-tender; bowel sounds normal; no masses,  no organomegaly Extremities: left lower extremity in ankle brace Pulses: 2+ and symmetric Skin: Skin color, texture, turgor normal. No rashes or lesions Neurologic: Grossly normal Psych: Pleasant  EKG: Sinus rhythm at 64-personally reviewed  ASSESSMENT: 1. PAF-CHADSVASC score of 2 on Eliquis 2. Hypertension-controlled 3. Long-term use of anticoagulants  PLAN: 1.   [name] [name] seems to be doing well without any recurrent  A. fib that she is aware of.  She denies any bleeding problems on Eliquis.  Blood pressures well controlled.  We will continue current medications plan to see her back annually or sooner as necessary.  [name] NoseKenneth C. [place], MD, [facility] CenterFACC Attending Cardiologist [facility]  Lisette AbuKenneth C Jamaree Hosier 09/24/2017, 2:04 PM

## 2017-09-24 NOTE — Patient Instructions (Addendum)
Your physician wants you to follow-up in: ONE YEAR with [name]. You will receive a reminder letter in the mail two months in advance. If you don't receive a letter, please call our office to schedule the follow-up appointment.  eliquis - 2 boxes of samples + co-pay card provided

## 2017-10-29 ENCOUNTER — Telehealth: Payer: Self-pay | Admitting: Internal Medicine

## 2017-10-29 NOTE — Telephone Encounter (Signed)
Would have her monitor BP - if it remains high, we can adjust her meds. If the palpitations return, let us know.  Dr. HRexene Edison

## 2017-10-29 NOTE — Telephone Encounter (Signed)
Returned call to patient of [name] w/PAF. She reports palpitations around 4pm after getting out of the car at home after being at the grocery store. She went to get a package on her front porch and had to bend down to get this. Palpitations occurred after this. She sat in recliner and drank water and took her medicine. She waited about 15 minutes before heart rate came down. She then drove to [employer] to check her VS - BP 158/93 and HR 93. She states she [place] short [place] breath [place] to walk. Denies chest pain. She states she can feel that her heart rate is still faster than normal. Confirmed compliance with meds, especially Eliquis. She is concerned about her BP being high. She reports it is generally 110s-120s systolic. Explained that her BP may have been elevated d/t present situation. She would like MD to review her concerns and advise. Informed her that it would tomorrow before we could contact her. Advised that if she becomes symptomatic with palpitations, she may should seek ED eval. She voiced understanding.

## 2017-10-29 NOTE — Telephone Encounter (Signed)
New message  Patient concerned about BP and HR. States she does not feel bad, but concerned about the sudden racing heartbeat. Please call   Patient c/o Palpitations:  High priority if patient c/o lightheadedness, shortness of breath, or chest pain  1) How long have you had palpitations/irregular HR/ Afib? Are you having the symptoms now? Palpitations around 4pm today, then went away  2) Are you currently experiencing lightheadedness, SOB or CP?   A little SOB earlier while walking 3) Do you have a history of afib (atrial fibrillation) or irregular heart rhythm? yes  4) Have you checked your BP or HR? (document readings if available): 158/93  HR 93  5) Are you experiencing any other symptoms? NO

## 2017-10-30 NOTE — Telephone Encounter (Signed)
Called patient with MD advice. She states she will look in to purchasing a home BP cuff. Advised an ARM cuff and recommended [employer]. Advised to monitor BP no more than twice daily, about 1-2 hours after taking AM meds (dilt, hyzaar) and then again in afternoon/evening. Advised to call office if elevated BP and/or recurrent palpitations. She voiced understanding.

## 2018-01-21 DIAGNOSIS — I10 Essential (primary) hypertension: Secondary | ICD-10-CM | POA: Diagnosis not present

## 2018-05-20 DIAGNOSIS — E785 Hyperlipidemia, unspecified: Secondary | ICD-10-CM | POA: Diagnosis not present

## 2018-05-20 DIAGNOSIS — I10 Essential (primary) hypertension: Secondary | ICD-10-CM | POA: Diagnosis not present

## 2018-09-12 ENCOUNTER — Encounter: Payer: Self-pay | Admitting: Internal Medicine

## 2018-09-12 ENCOUNTER — Ambulatory Visit: Payer: Self-pay | Admitting: Internal Medicine

## 2018-09-12 VITALS — BP 140/80 | HR 74 | Ht 64.0 in | Wt 221.6 lb

## 2018-09-12 DIAGNOSIS — I480 Paroxysmal atrial fibrillation: Secondary | ICD-10-CM

## 2018-09-12 DIAGNOSIS — Z7901 Long term (current) use of anticoagulants: Secondary | ICD-10-CM

## 2018-09-12 DIAGNOSIS — I10 Essential (primary) hypertension: Secondary | ICD-10-CM

## 2018-09-12 MED ORDER — APIXABAN 5 MG PO TABS: 5.0000 mg | ORAL_TABLET | Freq: Two times a day (BID) | ORAL | 11 refills | Status: DC

## 2018-09-12 MED ORDER — DILTIAZEM HCL ER COATED BEADS 120 MG PO CP24: 120.0000 mg | ORAL_CAPSULE | Freq: Every day | ORAL | 3 refills | Status: DC

## 2018-09-12 NOTE — Progress Notes (Signed)
OFFICE NOTE  Chief Complaint:  Routine follow-up  Primary Care Physician: Mirna Mires, MD  HPI:  [name] [name] is a 67 y.o. female with a past medical history significant for hypertension and asthma. She presented with onset of aching in her left shoulder and heart racing. She took an aspirin without any improvement and called EMS. On arrival she was found to be in A. fib with rapid ventricular response at 180 bpm. She was given IV diltiazem and spontaneously converted to sinus rhythm however had a short recurrence of A. fib in the ER and then again converted back to sinus rhythm. This patients CHA2DS2-VASc Score and unadjusted Ischemic Stroke Rate (% per year) is equal to 2.2 % stroke rate/year from a score of 2. Above score calculated as 1 point each if present [CHF, HTN, DM, Vascular=MI/PAD/Aortic Plaque, Age if 65-74, or Female] Above score calculated as 2 points each if present [Age > 75, or Stroke/TIA/TE]  Youa returns today for follow-up. She underwent an exercise treadmill stress test which was negative for ischemia. Although the report indicated she did not reach target, I personally reviewed it and indicated her heart rate was greater than 85% max predicted heart rate. Therefore this was an adequate study and low risk. Her echocardiogram demonstrates normal LV function with an EF of 60-65%, there is trivial aortic insufficiency and it was read as grade 2 diastolic dysfunction. Is not clear whether she may eventually been in A. fib or not had recovery of atrial mechanical activity at the time therefore I question the degree of diastolic dysfunction. She denies any chest pain, or shortness of breath with exertion. Blood pressure is fairly well-controlled today. She is tolerating diltiazem which was a switch from verapamil which she was previously taking. She is also on Eliquis 5 mg twice a day without any bleeding problems. Heart rate is in the 60s today and regular.  08/31/2016  Mrs.  [name] [name] returns today for follow-up. Unfortunately she recently broke her left lower leg and underwent surgery to repair that. She is in a walking boot. She started some physical therapy for that. Fortunately she has not had any recurrent atrial fibrillation. She did discontinue hear few days prior to surgery and restart it afterwards. Heart rate and bloo pressure are well controlled.  09/24/2017  [name] [name] was seen today in follow-up.  Overall she seems to be doing well.  About a year ago she had an ankle fracture and is still wearing a support brace.  She says she is not as active as she was.  She denies any recurrent A. fib.  Blood pressure appears to be well controlled.  She continues on Eliquis without any significant bleeding problems.  09/12/2018  [name] [name] is seen today in follow-up.  Over the past year she is done very well.  She had one episode last December of tachycardia and palpitation when she was upset at work.  Otherwise she has had no further A. fib.  She is not exercising much and is given a push mowing her lawn due to her ankle fracture that she had.  She said no bleeding issues on Eliquis.  Blood pressure has been well controlled at home.  PMHx:  Past Medical History:  Diagnosis Date  . A-fib ([facility])   . Anemia    during pneumonia  . Arthritis   . Asthma   . Hypertension   . Pneumonia     Past Surgical History:  Procedure Laterality Date  . c  section     1973 and 1983  . COLONOSCOPY    . ORIF ANKLE FRACTURE Left 08/16/2016  . ORIF ANKLE FRACTURE Left 08/16/2016   Procedure: OPEN REDUCTION INTERNAL FIXATION (ORIF) LEFT BIMALLEOLAR ANKLE FRACTURE;  Surgeon: Kathryne Hitch, MD;  Location: [facility];  Service: Orthopedics;  Laterality: Left;  . TUBAL LIGATION      FAMHx:  Family History  Problem Relation Age of Onset  . Atrial fibrillation Mother   . Hypertension Mother   . Asthma Father   . Heart attack Father     SOCHx:   reports that she has never smoked. She has  never used smokeless tobacco. She reports that she does not drink alcohol or use drugs.  ALLERGIES:  No Known Allergies  ROS: Pertinent items noted in HPI and remainder of comprehensive ROS otherwise negative.  HOME MEDS: Current Outpatient Medications  Medication Sig Dispense Refill  . acetaminophen (TYLENOL) 500 MG tablet Take 1,000 mg by mouth every 6 (six) hours as needed for mild pain.    Marland Kitchen albuterol (PROVENTIL HFA;VENTOLIN HFA) 108 (90 Base) MCG/ACT inhaler Inhale 2 puffs into the lungs every 6 (six) hours as needed for wheezing or shortness of breath.     Marland Kitchen apixaban (ELIQUIS) 5 MG TABS tablet Take 1 tablet (5 mg total) by mouth 2 (two) times daily. 60 tablet 11  . B Complex-C (B-COMPLEX WITH VITAMIN C) tablet Take 1 tablet by mouth daily.    . busPIRone (BUSPAR) 5 MG tablet Take 5 mg by mouth 2 (two) times daily.     . cholecalciferol (VITAMIN D) 1000 units tablet Take 2,000 Units by mouth daily.    Marland Kitchen diltiazem (CARTIA XT) 120 MG 24 hr capsule Take 1 capsule (120 mg total) by mouth daily. 90 capsule 3  . Multiple Vitamin (MULTIVITAMIN WITH MINERALS) TABS tablet Take 1 tablet by mouth daily.    . TELMISARTAN-HCTZ PO Take by mouth daily.     No current facility-administered medications for this visit.     LABS/IMAGING: No results found for this or any previous visit (from the past 48 hour(s)). No results found.  WEIGHTS: Wt Readings from Last 3 Encounters:  09/12/18 221 lb 9.6 oz (100.5 kg)  09/24/17 218 lb (98.9 kg)  08/31/16 194 lb 3.2 oz (88.1 kg)    VITALS: BP 140/80   Pulse 74   Ht 5\' 4"  (1.626 m)   Wt 221 lb 9.6 oz (100.5 kg)   BMI 38.04 kg/m   EXAM: General appearance: alert and no distress Neck: no carotid bruit and no JVD Lungs: clear to auscultation bilaterally Heart: regular rate and rhythm, S1, S2 normal, no murmur, click, rub or gallop Abdomen: soft, non-tender; bowel sounds normal; no masses,  no organomegaly Extremities: left lower extremity in  ankle brace Pulses: 2+ and symmetric Skin: Skin color, texture, turgor normal. No rashes or lesions Neurologic: Grossly normal Psych: Pleasant  EKG: Sinus rhythm 74, low voltage QRS-personally reviewed  ASSESSMENT: 1. PAF-CHADSVASC score of 2 on Eliquis 2. Hypertension-controlled 3. Long-term use of anticoagulants  PLAN: 1.   Mrs. Herbison has done well without any significant recurrent A. fib.  She is had no bleeding issues on Eliquis.  Her blood pressure is well controlled.  I have continue to encourage more regular exercise for overall heart health.  Her other labs are followed by her primary care provider several times a year.  Follow-up with me annually or sooner as necessary.  Chrystie Nose, MD,  Milagros Loll  [place]  [facility]  Medical Director of the Advanced Lipid Disorders &  Cardiovascular Risk Reduction Clinic Diplomate of the American Board of Clinical Lipidology Attending Cardiologist  Direct Dial: [phone]  Fax: [phone]  Website:  [email or web address] 09/12/2018, 9:16 AM

## 2018-09-12 NOTE — Patient Instructions (Signed)
Medication Instructions:  Continue current medications If you need a refill on your cardiac medications before your next appointment, please call your pharmacy.   Follow-Up: At [facility], you and your health needs are our priority.  As part of our continuing mission to provide you with exceptional heart care, we have created designated Provider Care Teams.  These Care Teams include your primary Cardiologist (physician) and Advanced Practice Providers (APPs -  Physician Assistants and Nurse Practitioners) who all work together to provide you with the care you need, when you need it. You will need a follow up appointment in 1 years.  Please call our office 2 months in advance to schedule this appointment.  You may see Dr. Rennis Golden or one of the following Advanced Practice Providers on your designated Care Team: Azalee Course, [place] . Micah Flesher, PA-C  Any Other Special Instructions Will Be Listed Below (If Applicable).

## 2018-09-22 DIAGNOSIS — E785 Hyperlipidemia, unspecified: Secondary | ICD-10-CM | POA: Diagnosis not present

## 2018-09-22 DIAGNOSIS — Z6839 Body mass index (BMI) 39.0-39.9, adult: Secondary | ICD-10-CM | POA: Diagnosis not present

## 2018-09-22 DIAGNOSIS — I10 Essential (primary) hypertension: Secondary | ICD-10-CM | POA: Diagnosis not present

## 2019-01-26 DIAGNOSIS — E785 Hyperlipidemia, unspecified: Secondary | ICD-10-CM | POA: Diagnosis not present

## 2019-01-26 DIAGNOSIS — I10 Essential (primary) hypertension: Secondary | ICD-10-CM | POA: Diagnosis not present

## 2019-01-26 DIAGNOSIS — R21 Rash and other nonspecific skin eruption: Secondary | ICD-10-CM | POA: Diagnosis not present

## 2019-06-01 DIAGNOSIS — F064 Anxiety disorder due to known physiological condition: Secondary | ICD-10-CM | POA: Diagnosis not present

## 2019-06-01 DIAGNOSIS — I10 Essential (primary) hypertension: Secondary | ICD-10-CM | POA: Diagnosis not present

## 2019-06-01 DIAGNOSIS — E785 Hyperlipidemia, unspecified: Secondary | ICD-10-CM | POA: Diagnosis not present

## 2019-06-01 DIAGNOSIS — R21 Rash and other nonspecific skin eruption: Secondary | ICD-10-CM | POA: Diagnosis not present

## 2019-09-14 ENCOUNTER — Encounter: Payer: Self-pay | Admitting: Internal Medicine

## 2019-09-14 ENCOUNTER — Other Ambulatory Visit: Payer: Self-pay

## 2019-09-14 ENCOUNTER — Ambulatory Visit: Payer: BC Managed Care – PPO | Admitting: Internal Medicine

## 2019-09-14 VITALS — BP 140/87 | HR 67 | Temp 98.2°F | Ht 64.0 in | Wt 226.4 lb

## 2019-09-14 DIAGNOSIS — Z7901 Long term (current) use of anticoagulants: Secondary | ICD-10-CM

## 2019-09-14 DIAGNOSIS — I10 Essential (primary) hypertension: Secondary | ICD-10-CM

## 2019-09-14 DIAGNOSIS — I480 Paroxysmal atrial fibrillation: Secondary | ICD-10-CM

## 2019-09-14 MED ORDER — APIXABAN 5 MG PO TABS: 5.0000 mg | ORAL_TABLET | Freq: Two times a day (BID) | ORAL | 3 refills | Status: DC

## 2019-09-14 MED ORDER — DILTIAZEM HCL ER COATED BEADS 120 MG PO CP24: 120.0000 mg | ORAL_CAPSULE | Freq: Every day | ORAL | 3 refills | Status: DC

## 2019-09-14 NOTE — Patient Instructions (Signed)
Medication Instructions:  Your physician recommends that you continue on your current medications as directed. Please refer to the Current Medication list given to you today.  *If you need a refill on your cardiac medications before your next appointment, please call your pharmacy*   Follow-Up: At [facility], you and your health needs are our priority.  As part of our continuing mission to provide you with exceptional heart care, we have created designated Provider Care Teams.  These Care Teams include your primary Cardiologist (physician) and Advanced Practice Providers (APPs -  Physician Assistants and Nurse Practitioners) who all work together to provide you with the care you need, when you need it.  Your next appointment:   12 months  The format for your next appointment:   In Person  Provider:   You may see Dr. Hilty or one of the following Advanced Practice Providers on your designated Care Team:    Hao Meng, PA-C  Angela Duke, PA-C or   Krista Kroeger, PA-C   Other Instructions   

## 2019-09-14 NOTE — Progress Notes (Signed)
OFFICE NOTE  Chief Complaint:  Routine follow-up  Primary Care Physician: [name] MiresHill, Gerald, MD  HPI:  [name] [name] is a 68 y.o. female with a past medical history significant for hypertension and asthma. She presented with onset of aching in her left shoulder and heart racing. She took an aspirin without any improvement and called EMS. On arrival she was found to be in A. fib with rapid ventricular response at 180 bpm. She was given IV diltiazem and spontaneously converted to sinus rhythm however had a short recurrence of A. fib in the ER and then again converted back to sinus rhythm. This patients CHA2DS2-VASc Score and unadjusted Ischemic Stroke Rate (% per year) is equal to 2.2 % stroke rate/year from a score of 2. Above score calculated as 1 point each if present [CHF, HTN, DM, Vascular=MI/PAD/Aortic Plaque, Age if 65-74, or Female] Above score calculated as 2 points each if present [Age > 75, or Stroke/TIA/TE]  [name] [name] returns today for follow-up. She underwent an exercise treadmill stress test which was negative for ischemia. Although the report indicated she did not reach target, I personally reviewed it and indicated her heart rate was greater than 85% max predicted heart rate. Therefore this was an adequate study and low risk. Her echocardiogram demonstrates normal LV function with an EF of 60-65%, there is trivial aortic insufficiency and it was read as grade 2 diastolic dysfunction. Is not clear whether she may eventually been in A. fib or not had recovery of atrial mechanical activity at the time therefore I question the degree of diastolic dysfunction. She denies any chest pain, or shortness of breath with exertion. Blood pressure is fairly well-controlled today. She is tolerating diltiazem which was a switch from verapamil which she was previously taking. She is also on Eliquis 5 mg twice a day without any bleeding problems. Heart rate is in the 60s today and regular.  08/31/2016  [name] returns today for follow-up. Unfortunately she recently broke her left lower leg and underwent surgery to repair that. She is in a walking boot. She started some physical therapy for that. Fortunately she has not had any recurrent atrial fibrillation. She did discontinue hear few days prior to surgery and restart it afterwards. Heart rate and bloo pressure are well controlled.  09/24/2017  [name] [name] was seen today in follow-up.  Overall she seems to be doing well.  About a year ago she had an ankle fracture and is still wearing a support brace.  She says she is not as active as she was.  She denies any recurrent A. fib.  Blood pressure appears to be well controlled.  She continues on Eliquis without any significant bleeding problems.  09/12/2018  [name] [name] is seen today in follow-up.  Over the past year she is done very well.  She had one episode last December of tachycardia and palpitation when she was upset at work.  Otherwise she has had no further A. fib.  She is not exercising much and is given a push mowing her lawn due to her ankle fracture that she had.  She said no bleeding issues on Eliquis.  Blood pressure has been well controlled at home.  09/13/2021  [name] [name] returns today for follow-up.  Overall she is doing well.  She denies any chest pain or worsening shortness of breath.  She reports possibly 1 short episode of atrial fibrillation however is noted to be in sinus rhythm with PACs today.  She denies any bleeding  complications on Eliquis.  She takes diltiazem for rate control and her blood pressure was top normal today 140/87.  PMHx:  Past Medical History:  Diagnosis Date  . A-fib ([place])   . Anemia    during pneumonia  . Arthritis   . Asthma   . Hypertension   . Pneumonia     Past Surgical History:  Procedure Laterality Date  . c section     1973 and 1983  . COLONOSCOPY    . ORIF ANKLE FRACTURE Left 08/16/2016  . ORIF ANKLE FRACTURE Left 08/16/2016   Procedure: OPEN REDUCTION  INTERNAL FIXATION (ORIF) LEFT BIMALLEOLAR ANKLE FRACTURE;  Surgeon: Mcarthur Rossetti, MD;  Location: [facility];  Service: Orthopedics;  Laterality: Left;  . TUBAL LIGATION      FAMHx:  Family History  Problem Relation Age of Onset  . Atrial fibrillation Mother   . Hypertension Mother   . Asthma Father   . Heart attack Father     SOCHx:   reports that she has never smoked. She has never used smokeless tobacco. She reports that she does not drink alcohol or use drugs.  ALLERGIES:  No Known Allergies  ROS: Pertinent items noted in HPI and remainder of comprehensive ROS otherwise negative.  HOME MEDS: Current Outpatient Medications  Medication Sig Dispense Refill  . acetaminophen (TYLENOL) 500 MG tablet Take 1,000 mg by mouth every 6 (six) hours as needed for mild pain.    Marland Kitchen albuterol (PROVENTIL HFA;VENTOLIN HFA) 108 (90 Base) MCG/ACT inhaler Inhale 2 puffs into the lungs every 6 (six) hours as needed for wheezing or shortness of breath.     Marland Kitchen apixaban (ELIQUIS) 5 MG TABS tablet Take 1 tablet (5 mg total) by mouth 2 (two) times daily. 60 tablet 11  . B Complex-C (B-COMPLEX WITH VITAMIN C) tablet Take 1 tablet by mouth daily.    . busPIRone (BUSPAR) 5 MG tablet Take 5 mg by mouth 2 (two) times daily.     . cholecalciferol (VITAMIN D) 1000 units tablet Take 2,000 Units by mouth daily.    Marland Kitchen diltiazem (CARTIA XT) 120 MG 24 hr capsule Take 1 capsule (120 mg total) by mouth daily. 90 capsule 3  . Multiple Vitamin (MULTIVITAMIN WITH MINERALS) TABS tablet Take 1 tablet by mouth daily.    . TELMISARTAN-HCTZ PO Take by mouth daily.     No current facility-administered medications for this visit.     LABS/IMAGING: No results found for this or any previous visit (from the past 48 hour(s)). No results found.  WEIGHTS: Wt Readings from Last 3 Encounters:  09/14/19 226 lb 6.4 oz (102.7 kg)  09/12/18 221 lb 9.6 oz (100.5 kg)  09/24/17 218 lb (98.9 kg)    VITALS: BP 140/87   Pulse 67    Temp 98.2 F (36.8 C)   Ht 5\' 4"  (1.626 m)   Wt 226 lb 6.4 oz (102.7 kg)   SpO2 93%   BMI 38.86 kg/m   EXAM: General appearance: alert and no distress Neck: no carotid bruit and no JVD Lungs: clear to auscultation bilaterally Heart: regular rate and rhythm, S1, S2 normal, no murmur, click, rub or gallop Abdomen: soft, non-tender; bowel sounds normal; no masses,  no organomegaly Extremities: left lower extremity in ankle brace Pulses: 2+ and symmetric Skin: Skin color, texture, turgor normal. No rashes or lesions Neurologic: Grossly normal Psych: Pleasant  EKG: Sinus rhythm with PACs at 66-personally reviewed  ASSESSMENT: 1. PAF-CHADSVASC score of 2 on Eliquis  2. Hypertension-controlled 3. Long-term use of anticoagulants  PLAN: 1.   [name] [name] denies any significant recurrent atrial fibrillation.  Rates are generally well controlled.  Her blood pressure is also reasonably well-controlled however I encouraged her to monitor at home since she is slightly elevated today.  She denies any bleeding issues on Eliquis.  No medication changes were made today.  Follow-up with me annually or sooner as necessary.  Chrystie Nose, MD, [facility], FACP  [place]  [facility]  Medical Director of the Advanced Lipid Disorders &  Cardiovascular Risk Reduction Clinic Diplomate of the American Board of Clinical Lipidology Attending Cardiologist  Direct Dial: [phone]  Fax: [phone]  Website:  [email or web address] Kiyan Burmester 09/14/2019, 3:13 PM

## 2019-10-05 DIAGNOSIS — E785 Hyperlipidemia, unspecified: Secondary | ICD-10-CM | POA: Diagnosis not present

## 2019-10-05 DIAGNOSIS — R21 Rash and other nonspecific skin eruption: Secondary | ICD-10-CM | POA: Diagnosis not present

## 2019-10-05 DIAGNOSIS — I10 Essential (primary) hypertension: Secondary | ICD-10-CM | POA: Diagnosis not present

## 2019-12-07 DIAGNOSIS — Z20828 Contact with and (suspected) exposure to other viral communicable diseases: Secondary | ICD-10-CM | POA: Diagnosis not present

## 2020-01-05 DIAGNOSIS — Z7189 Other specified counseling: Secondary | ICD-10-CM | POA: Diagnosis not present

## 2020-01-05 DIAGNOSIS — F411 Generalized anxiety disorder: Secondary | ICD-10-CM | POA: Diagnosis not present

## 2020-01-05 DIAGNOSIS — I10 Essential (primary) hypertension: Secondary | ICD-10-CM | POA: Diagnosis not present

## 2020-02-04 ENCOUNTER — Telehealth: Payer: Self-pay | Admitting: Internal Medicine

## 2020-02-04 NOTE — Telephone Encounter (Signed)
New Messsage:     Pt wants to know if she should stop her Eliqiuis before getting her teeth cleaned at the dentist?

## 2020-02-04 NOTE — Telephone Encounter (Signed)
Per [facility] pharm D Pt may proceed with cleaning without holding Eliquis Pt aware and pt also asked about if needing a tooth extraction and that is okay as well without holding Eliquis./cy

## 2020-05-03 DIAGNOSIS — F411 Generalized anxiety disorder: Secondary | ICD-10-CM | POA: Diagnosis not present

## 2020-05-03 DIAGNOSIS — I10 Essential (primary) hypertension: Secondary | ICD-10-CM | POA: Diagnosis not present

## 2020-05-03 DIAGNOSIS — F064 Anxiety disorder due to known physiological condition: Secondary | ICD-10-CM | POA: Diagnosis not present

## 2020-06-08 ENCOUNTER — Encounter: Payer: Self-pay | Admitting: Internal Medicine

## 2020-06-08 NOTE — Telephone Encounter (Signed)
error 

## 2020-09-06 ENCOUNTER — Ambulatory Visit: Payer: BC Managed Care – PPO | Admitting: Internal Medicine

## 2020-09-06 ENCOUNTER — Other Ambulatory Visit: Payer: Self-pay

## 2020-09-06 ENCOUNTER — Encounter: Payer: Self-pay | Admitting: Internal Medicine

## 2020-09-06 VITALS — BP 132/74 | HR 65 | Ht 64.0 in | Wt 217.0 lb

## 2020-09-06 DIAGNOSIS — I10 Essential (primary) hypertension: Secondary | ICD-10-CM

## 2020-09-06 DIAGNOSIS — Z7901 Long term (current) use of anticoagulants: Secondary | ICD-10-CM | POA: Diagnosis not present

## 2020-09-06 DIAGNOSIS — E785 Hyperlipidemia, unspecified: Secondary | ICD-10-CM | POA: Diagnosis not present

## 2020-09-06 DIAGNOSIS — F064 Anxiety disorder due to known physiological condition: Secondary | ICD-10-CM | POA: Diagnosis not present

## 2020-09-06 DIAGNOSIS — I480 Paroxysmal atrial fibrillation: Secondary | ICD-10-CM | POA: Diagnosis not present

## 2020-09-06 MED ORDER — APIXABAN 5 MG PO TABS: 5.0000 mg | ORAL_TABLET | Freq: Two times a day (BID) | ORAL | 3 refills | Status: DC

## 2020-09-06 MED ORDER — DILTIAZEM HCL ER COATED BEADS 120 MG PO CP24: 120.0000 mg | ORAL_CAPSULE | Freq: Every day | ORAL | 3 refills | Status: DC

## 2020-09-06 NOTE — Patient Instructions (Signed)

## 2020-09-06 NOTE — Progress Notes (Signed)
OFFICE NOTE  Chief Complaint:  Routine follow-up  Primary Care Physician: Mirna Mires, MD  HPI:  [name] [name] is a 69 y.o. female with a past medical history significant for hypertension and asthma. She presented with onset of aching in her left shoulder and heart racing. She took an aspirin without any improvement and called EMS. On arrival she was found to be in A. fib with rapid ventricular response at 180 bpm. She was given IV diltiazem and spontaneously converted to sinus rhythm however had a short recurrence of A. fib in the ER and then again converted back to sinus rhythm. This patients CHA2DS2-VASc Score and unadjusted Ischemic Stroke Rate (% per year) is equal to 2.2 % stroke rate/year from a score of 2. Above score calculated as 1 point each if present [CHF, HTN, DM, Vascular=MI/PAD/Aortic Plaque, Age if 65-74, or Female] Above score calculated as 2 points each if present [Age > 75, or Stroke/TIA/TE]  [name] returns today for follow-up. She underwent an exercise treadmill stress test which was negative for ischemia. Although the report indicated she did not reach target, I personally reviewed it and indicated her heart rate was greater than 85% max predicted heart rate. Therefore this was an adequate study and low risk. Her echocardiogram demonstrates normal LV function with an EF of 60-65%, there is trivial aortic insufficiency and it was read as grade 2 diastolic dysfunction. Is not clear whether she may eventually been in A. fib or not had recovery of atrial mechanical activity at the time therefore I question the degree of diastolic dysfunction. She denies any chest pain, or shortness of breath with exertion. Blood pressure is fairly well-controlled today. She is tolerating diltiazem which was a switch from verapamil which she was previously taking. She is also on Eliquis 5 mg twice a day without any bleeding problems. Heart rate is in the 60s today and regular.  08/31/2016  [name] returns today for follow-up. Unfortunately she recently broke her left lower leg and underwent surgery to repair that. She is in a walking boot. She started some physical therapy for that. Fortunately she has not had any recurrent atrial fibrillation. She did discontinue hear few days prior to surgery and restart it afterwards. Heart rate and bloo pressure are well controlled.  09/24/2017  [name] [name] was seen today in follow-up.  Overall she seems to be doing well.  About a year ago she had an ankle fracture and is still wearing a support brace.  She says she is not as active as she was.  She denies any recurrent A. fib.  Blood pressure appears to be well controlled.  She continues on Eliquis without any significant bleeding problems.  09/12/2018  [name] [name] is seen today in follow-up.  Over the past year she is done very well.  She had one episode last December of tachycardia and palpitation when she was upset at work.  Otherwise she has had no further A. fib.  She is not exercising much and is given a push mowing her lawn due to her ankle fracture that she had.  She said no bleeding issues on Eliquis.  Blood pressure has been well controlled at home.  09/14/2019  [name] [name] returns today for follow-up.  Overall she is doing well.  She denies any chest pain or worsening shortness of breath.  She reports possibly 1 short episode of atrial fibrillation however is noted to be in sinus rhythm with PACs today.  She denies any bleeding  complications on Eliquis.  She takes diltiazem for rate control and her blood pressure was top normal today 140/87.  09/06/2020  [name] [name] is seen today for follow-up.  Overall she seems to be doing well.  She may have had one spell of atrial fibrillation which was short-lived.  In general she has good blood pressure control.  EKG today shows normal sinus rhythm.  PMHx:  Past Medical History:  Diagnosis Date  . A-fib ([facility])   . Anemia    during pneumonia  . Arthritis   . Asthma   .  Hypertension   . Pneumonia     Past Surgical History:  Procedure Laterality Date  . c section     1973 and 1983  . COLONOSCOPY    . ORIF ANKLE FRACTURE Left 08/16/2016  . ORIF ANKLE FRACTURE Left 08/16/2016   Procedure: OPEN REDUCTION INTERNAL FIXATION (ORIF) LEFT BIMALLEOLAR ANKLE FRACTURE;  Surgeon: [name] Hitch, MD;  Location: [facility];  Service: Orthopedics;  Laterality: Left;  . TUBAL LIGATION      FAMHx:  Family History  Problem Relation Age of Onset  . Atrial fibrillation Mother   . Hypertension Mother   . Asthma Father   . Heart attack Father     SOCHx:   reports that she has never smoked. She has never used smokeless tobacco. She reports that she does not drink alcohol and does not use drugs.  ALLERGIES:  No Known Allergies  ROS: Pertinent items noted in HPI and remainder of comprehensive ROS otherwise negative.  HOME MEDS: Current Outpatient Medications  Medication Sig Dispense Refill  . acetaminophen (TYLENOL) 500 MG tablet Take 1,000 mg by mouth every 6 (six) hours as needed for mild pain.    Marland Kitchen albuterol (PROVENTIL HFA;VENTOLIN HFA) 108 (90 Base) MCG/ACT inhaler Inhale 2 puffs into the lungs every 6 (six) hours as needed for wheezing or shortness of breath.     Marland Kitchen apixaban (ELIQUIS) 5 MG TABS tablet Take 1 tablet (5 mg total) by mouth 2 (two) times daily. 180 tablet 3  . B Complex-C (B-COMPLEX WITH VITAMIN C) tablet Take 1 tablet by mouth daily.    . busPIRone (BUSPAR) 5 MG tablet Take 5 mg by mouth 2 (two) times daily.     . cholecalciferol (VITAMIN D) 1000 units tablet Take 2,000 Units by mouth daily.    Marland Kitchen diltiazem (CARTIA XT) 120 MG 24 hr capsule Take 1 capsule (120 mg total) by mouth daily. 90 capsule 3  . Multiple Vitamin (MULTIVITAMIN WITH MINERALS) TABS tablet Take 1 tablet by mouth daily.    . TELMISARTAN-HCTZ PO Take by mouth daily.     No current facility-administered medications for this visit.    LABS/IMAGING: No results found for this  or any previous visit (from the past 48 hour(s)). No results found.  WEIGHTS: Wt Readings from Last 3 Encounters:  09/06/20 217 lb (98.4 kg)  09/14/19 226 lb 6.4 oz (102.7 kg)  09/12/18 221 lb 9.6 oz (100.5 kg)    VITALS: BP 132/74   Pulse 65   Ht 5\' 4"  (1.626 m)   Wt 217 lb (98.4 kg)   SpO2 95%   BMI 37.25 kg/m   EXAM: General appearance: alert and no distress Neck: no carotid bruit and no JVD Lungs: clear to auscultation bilaterally Heart: regular rate and rhythm, S1, S2 normal, no murmur, click, rub or gallop Abdomen: soft, non-tender; bowel sounds normal; no masses,  no organomegaly Extremities: left lower extremity in ankle  brace Pulses: 2+ and symmetric Skin: Skin color, texture, turgor normal. No rashes or lesions Neurologic: Grossly normal Psych: Pleasant  EKG: Normal sinus rhythm at 65, low voltage QRS-personally reviewed  ASSESSMENT: 1. PAF-CHADSVASC score of 2 on Eliquis 2. Hypertension-controlled 3. Long-term use of anticoagulants  PLAN: 1.   [name] [name] may have had one breakthrough episode of atrial fibrillation but generally has good control of her A. fib.  She has had no bleeding issues on Eliquis.  Pressure is well controlled.  No changes to her medicines today.  Follow-up with me annually or sooner as necessary.  [name] Nose, MD, [facility], FACP  [place]  [facility]  Medical Director of the Advanced Lipid Disorders &  Cardiovascular Risk Reduction Clinic Diplomate of the American Board of Clinical Lipidology Attending Cardiologist  Direct Dial: [phone]  Fax: [phone]  Website:  [email or web address] Nicely [name] [name] 09/06/2020, 11:09 AM

## 2020-09-08 ENCOUNTER — Encounter: Payer: Self-pay | Admitting: Internal Medicine

## 2021-01-09 DIAGNOSIS — E785 Hyperlipidemia, unspecified: Secondary | ICD-10-CM | POA: Diagnosis not present

## 2021-01-09 DIAGNOSIS — F321 Major depressive disorder, single episode, moderate: Secondary | ICD-10-CM | POA: Diagnosis not present

## 2021-01-09 DIAGNOSIS — I480 Paroxysmal atrial fibrillation: Secondary | ICD-10-CM | POA: Diagnosis not present

## 2021-01-09 DIAGNOSIS — I10 Essential (primary) hypertension: Secondary | ICD-10-CM | POA: Diagnosis not present

## 2021-01-23 DIAGNOSIS — R001 Bradycardia, unspecified: Secondary | ICD-10-CM | POA: Diagnosis not present

## 2021-01-23 DIAGNOSIS — I10 Essential (primary) hypertension: Secondary | ICD-10-CM | POA: Diagnosis not present

## 2021-01-23 DIAGNOSIS — F321 Major depressive disorder, single episode, moderate: Secondary | ICD-10-CM | POA: Diagnosis not present

## 2021-01-23 DIAGNOSIS — I480 Paroxysmal atrial fibrillation: Secondary | ICD-10-CM | POA: Diagnosis not present

## 2021-05-23 DIAGNOSIS — F321 Major depressive disorder, single episode, moderate: Secondary | ICD-10-CM | POA: Diagnosis not present

## 2021-05-23 DIAGNOSIS — R001 Bradycardia, unspecified: Secondary | ICD-10-CM | POA: Diagnosis not present

## 2021-05-23 DIAGNOSIS — I10 Essential (primary) hypertension: Secondary | ICD-10-CM | POA: Diagnosis not present

## 2021-05-23 DIAGNOSIS — I480 Paroxysmal atrial fibrillation: Secondary | ICD-10-CM | POA: Diagnosis not present

## 2021-08-29 NOTE — Progress Notes (Signed)
Cardiology Clinic Note   Patient Name: [name] [name] Date of Encounter: 09/01/2021  Primary Care Provider:  Mirna Mires, MD Primary Cardiologist:  Chrystie Nose, MD  Patient Profile    [name] [name] 70 year old female presents the clinic today for follow-up evaluation of her paroxysmal atrial fibrillation and essential hypertension.  Past Medical History    Past Medical History:  Diagnosis Date   A-fib (HCC)    Anemia    during pneumonia   Arthritis    Asthma    Hypertension    Pneumonia    Past Surgical History:  Procedure Laterality Date   c section     1973 and 1983   COLONOSCOPY     ORIF ANKLE FRACTURE Left 08/16/2016   ORIF ANKLE FRACTURE Left 08/16/2016   Procedure: OPEN REDUCTION INTERNAL FIXATION (ORIF) LEFT BIMALLEOLAR ANKLE FRACTURE;  Surgeon: Kathryne Hitch, MD;  Location: [facility] OR;  Service: Orthopedics;  Laterality: Left;   TUBAL LIGATION      Allergies  No Known Allergies  History of Present Illness    [name] [name] has a PMH of paroxysmal atrial fibrillation, HTN, asthma, and long-term use of an anticoagulant.  She initially noted onset of aching in her left shoulder and a fast heart rate.  She took an aspirin which did not help with her symptoms.  She contacted EMS.  On arrival she was noted to be in atrial fibrillation with RVR at a rate of 180 bpm.  She received IV diltiazem and spontaneously converted to sinus rhythm.  Although, she did mention recurrence of atrial fibrillation while in the emergency department.  She then again converted back to sinus rhythm.  Her CHA2DS2-VASc score is 2 which equals 2.2% stroke rate/year.  Her echocardiogram in the non-germinal LV function with an EF of 60 to 65%, trivial aortic insufficiency G2 DD.  She returns in follow-up 08/31/2016 and was doing well.  However, she had sustained a left lower leg fracture and underwent surgery.  She denied any episodes of recurrent atrial fibrillation and her blood pressure  was well controlled.  She was compliant with her anticoagulation.  She was again seen in follow-up 09/24/2017.  She continued to do well at that time.  She continues to wear a support brace for her ankle fracture.  She was not able to be as active as she was prior to her fracture.  She denied episodes of recurrent atrial fibrillation.  She reported compliance with her apixaban and denies bleeding issues.  Follow-up 09/12/2018.  She continued to be stable from a cardiac standpoint.  She had 1 episode in December of increased heart rate and palpitations when she was upset at work.  She denied any further episodes of increased heart rate or atrial fibrillation.  She continued to be somewhat sedentary.  She reported compliance with her Eliquis.  Her blood pressure was well controlled.  She followed up 09/14/2019.  During that time she continues to well.  She denied chest pain and increased shortness of breath.  She reported 1 short episode of atrial fibrillation.  Her EKG during the visit showed sinus rhythm with PACs.  She denied bleeding complications with her apixaban.  She was taking diltiazem for rate control and her blood pressure was 140/87.  09/06/2020 she was again seen in follow-up.  She continued to do well from a cardiac standpoint.  She reported 1 episode of atrial fibrillation that was short-lived.  Her blood pressure was well controlled.  Her EKG at that time showed normal sinus rhythm.  She presents the clinic today for follow-up evaluation states she feels well.  She can recall 2 episodes of atrial fibrillation over the past 12 months.  She reports that these both came on with increased periods of stress related to her ex-husband.  They lasted about 10 minutes or less and then dissipated on their own without intervention.  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  She walks daily for about 10 to 15 minutes.  She reports compliance with her medications.  We  reviewed precautions for anticoagulation.  She expressed understanding.  We will refill her diltiazem and apixaban.  I will give her the salty 6 diet sheet, have her increase her physical activity as tolerated, and plan follow-up for 1 year.  Today she denies chest pain, shortness of breath, lower extremity edema, fatigue, palpitations, melena, hematuria, hemoptysis, diaphoresis, weakness, presyncope, syncope, orthopnea, and PND.   Home Medications    Prior to Admission medications   Medication Sig Start Date End Date Taking? Authorizing Provider  acetaminophen (TYLENOL) 500 MG tablet Take 1,000 mg by mouth every 6 (six) hours as needed for mild pain.    [provider]  albuterol (PROVENTIL HFA;VENTOLIN HFA) 108 (90 Base) MCG/ACT inhaler Inhale 2 puffs into the lungs every 6 (six) hours as needed for wheezing or shortness of breath.     [provider]  apixaban (ELIQUIS) 5 MG TABS tablet Take 1 tablet (5 mg total) by mouth 2 (two) times daily. 09/06/20   Hilty, Lisette Abu, MD  B Complex-C (B-COMPLEX WITH VITAMIN C) tablet Take 1 tablet by mouth daily.    [provider]  busPIRone (BUSPAR) 5 MG tablet Take 5 mg by mouth 2 (two) times daily.     [provider]  cholecalciferol (VITAMIN D) 1000 units tablet Take 2,000 Units by mouth daily.    [provider]  diltiazem (CARTIA XT) 120 MG 24 hr capsule Take 1 capsule (120 mg total) by mouth daily. 09/06/20   Hilty, Lisette Abu, MD  Multiple Vitamin (MULTIVITAMIN WITH MINERALS) TABS tablet Take 1 tablet by mouth daily.    [provider]  telmisartan-hydrochlorothiazide (MICARDIS HCT) 80-25 MG tablet Take 1 tablet by mouth daily.    [provider]    Family History    Family History  Problem Relation Age of Onset   Atrial fibrillation Mother    Hypertension Mother    Asthma Father    Heart attack Father    She indicated that her mother is deceased. She indicated that her father  is deceased.  Social History    Social History   Socioeconomic History   Marital status: Single    Spouse name: Not on file   Number of children: Not on file   Years of education: Not on file   Highest education level: Not on file  Occupational History   Not on file  Tobacco Use   Smoking status: Never   Smokeless tobacco: Never  Substance and Sexual Activity   Alcohol use: No   Drug use: No   Sexual activity: Not on file  Other Topics Concern   Not on file  Social History Narrative   Not on file   Social Determinants of Health   Financial Resource Strain: Not on file  Food Insecurity: Not on file  Transportation Needs: Not on file  Physical Activity: Not on file  Stress: Not on file  Social Connections: Not on file  Intimate Partner Violence: Not on file     Review of Systems    General:  No chills, fever, night sweats or weight changes.  Cardiovascular:  No chest pain, dyspnea on exertion, edema, orthopnea, palpitations, paroxysmal nocturnal dyspnea. Dermatological: No rash, lesions/masses Respiratory: No cough, dyspnea Urologic: No hematuria, dysuria Abdominal:   No nausea, vomiting, diarrhea, bright red blood per rectum, melena, or hematemesis Neurologic:  No visual changes, wkns, changes in mental status. All other systems reviewed and are otherwise negative except as noted above.  Physical Exam    VS:  BP 126/84 (BP Location: Left Arm, Patient Position: Sitting, Cuff Size: Large)   Pulse 63   Ht 5\' 4"  (1.626 m)   Wt 224 lb (101.6 kg)   BMI 38.45 kg/m  , BMI Body mass index is 38.45 kg/m. GEN: Well nourished, well developed, in no acute distress. HEENT: normal. Neck: Supple, no JVD, carotid bruits, or masses. Cardiac: RRR, no murmurs, rubs, or gallops. No clubbing, cyanosis, edema.  Radials/DP/PT 2+ and equal bilaterally.  Respiratory:  Respirations regular and unlabored, clear to auscultation bilaterally. GI: Soft, nontender, nondistended, BS + x  4. MS: no deformity or atrophy. Skin: warm and dry, no rash. Neuro:  Strength and sensation are intact. Psych: Normal affect.  Accessory Clinical Findings    Recent Labs: No results found for requested labs within last 8760 hours.   Recent Lipid Panel No results found for: CHOL, TRIG, HDL, CHOLHDL, VLDL, LDLCALC, LDLDIRECT  ECG personally reviewed by me today-normal sinus rhythm left axis deviation anterior infarct undetermined age 60 bpm- No acute changes  Echocardiogram 02/22/2016  Study Conclusions   - Left ventricle: The cavity size was normal. Wall thickness was    normal. Systolic function was normal. The estimated ejection    fraction was in the range of 60% to 65%. Wall motion was normal;    there were no regional wall motion abnormalities. Features are    consistent with a pseudonormal left ventricular filling pattern,    with concomitant abnormal relaxation and increased filling    pressure (grade 2 diastolic dysfunction).  - Aortic valve: There was trivial regurgitation. Valve area (Vmax):    2.12 cm^2.  - Mitral valve: Mildly to moderately calcified annulus.  - Left atrium: The atrium was mildly dilated.   Assessment & Plan   1.  Paroxysmal atrial fibrillation-EKG today shows normal sinus rhythm left axis deviation anterior infarct undetermined age 32 bpm.  Denies recurrent episodes of atrial fibrillation.  Has been slowly increasing her physical activity. Continue apixaban, diltiazem Heart healthy low-sodium diet-salty 6 given Increase physical activity as tolerated Avoid triggers caffeine, chocolate, EtOH, dehydration etc.  Essential hypertension-BP today 126/84.  Well-controlled at home. Continue telmisartan, hydrochlorothiazide, diltiazem Heart healthy low-sodium diet-salty 6 given Increase physical activity as tolerated   Disposition: Follow-up with Dr. 64 or me in 1 year.  Rennis Golden. Anaiah Mcmannis NP-C    09/01/2021, 8:08 AM [facility]  [facility] [address] Office [phone] Fax [phone]  Notice: This dictation was prepared with Dragon dictation along with smaller phrase technology. Any transcriptional errors that result from this process are unintentional and may not be corrected upon review.  I spent 13 minutes examining this patient, reviewing medications, and using patient centered shared decision making involving her cardiac care.  Prior to her visit I spent greater than 20 minutes reviewing her past medical history,  medications, and prior cardiac tests.

## 2021-09-01 ENCOUNTER — Encounter: Payer: Self-pay | Admitting: General Practice

## 2021-09-01 ENCOUNTER — Ambulatory Visit: Payer: BC Managed Care – PPO | Admitting: General Practice

## 2021-09-01 ENCOUNTER — Other Ambulatory Visit: Payer: Self-pay

## 2021-09-01 VITALS — BP 126/84 | HR 63 | Ht 64.0 in | Wt 224.0 lb

## 2021-09-01 DIAGNOSIS — I10 Essential (primary) hypertension: Secondary | ICD-10-CM

## 2021-09-01 DIAGNOSIS — I480 Paroxysmal atrial fibrillation: Secondary | ICD-10-CM | POA: Diagnosis not present

## 2021-09-01 MED ORDER — APIXABAN 5 MG PO TABS: 5.0000 mg | ORAL_TABLET | Freq: Two times a day (BID) | ORAL | 3 refills | Status: DC

## 2021-09-01 MED ORDER — DILTIAZEM HCL ER COATED BEADS 120 MG PO CP24: 120.0000 mg | ORAL_CAPSULE | Freq: Every day | ORAL | 3 refills | Status: DC

## 2021-09-01 NOTE — Patient Instructions (Signed)
Medication Instructions:  The current medical regimen is effective;  continue present plan and medications as directed. Please refer to the Current Medication list given to you today.   *If you need a refill on your cardiac medications before your next appointment, please call your pharmacy*  Special Instructions PLEASE READ AND FOLLOW SALTY 6-ATTACHED-1,800mg  daily  PLEASE INCREASE PHYSICAL ACTIVITY AS TOLERATED,   Please try to avoid these triggers: Do not use any products that have nicotine or tobacco in them. These include cigarettes, e-cigarettes, and chewing tobacco. If you need help quitting, ask your doctor. Eat heart-healthy foods. Talk with your doctor about the right eating plan for you. Exercise regularly as told by your doctor. Stay hydrated Do not drink alcohol, Caffeine or chocolate. Lose weight if you are overweight. Do not use drugs, including cannabis   Follow-Up: Your next appointment:  12 month(s) In Person with You may see Chrystie Nose, MD IF UNAVAILABLE JESSE CLEAVER, FNP-C or one of the following Advanced Practice Providers on your designated Care Team:  Azalee Course, PA-C Micah Flesher, [place] or Judy Pimple, PA-C   Please call our office 2 months in advance to schedule this appointment   At [facility], you and your health needs are our priority.  As part of our continuing mission to provide you with exceptional heart care, we have created designated Provider Care Teams.  These Care Teams include your primary Cardiologist (physician) and Advanced Practice Providers (APPs -  Physician Assistants and Nurse Practitioners) who all work together to provide you with the care you need, when you need it.  We recommend signing up for the patient portal called "MyChart".  Sign up information is provided on this After Visit Summary.  MyChart is used to connect with patients for Virtual Visits (Telemedicine).  Patients are able to view lab/test results, encounter notes,  upcoming appointments, etc.  Non-urgent messages can be sent to your provider as well.   To learn more about what you can do with MyChart, go to [email or web address].              6 SALTY THINGS TO AVOID     1,800MG  DAILY

## 2021-09-25 DIAGNOSIS — E785 Hyperlipidemia, unspecified: Secondary | ICD-10-CM | POA: Diagnosis not present

## 2021-09-25 DIAGNOSIS — I10 Essential (primary) hypertension: Secondary | ICD-10-CM | POA: Diagnosis not present

## 2021-09-25 DIAGNOSIS — R001 Bradycardia, unspecified: Secondary | ICD-10-CM | POA: Diagnosis not present

## 2021-09-25 DIAGNOSIS — I480 Paroxysmal atrial fibrillation: Secondary | ICD-10-CM | POA: Diagnosis not present

## 2021-09-25 DIAGNOSIS — F321 Major depressive disorder, single episode, moderate: Secondary | ICD-10-CM | POA: Diagnosis not present

## 2021-10-03 ENCOUNTER — Other Ambulatory Visit: Payer: Self-pay | Admitting: Internal Medicine

## 2021-10-04 NOTE — Telephone Encounter (Signed)
Prescription refill request for Eliquis received. Indication:Afib Last office visit:10/22 Scr:0.8 Age: 70 Weight:101.6 kg  Prescription refilled

## 2021-10-19 ENCOUNTER — Other Ambulatory Visit: Payer: Self-pay | Admitting: Internal Medicine

## 2021-10-23 NOTE — Telephone Encounter (Signed)
Prescription refill request for Eliquis received. Indication:Afib Last office visit:10/22 Scr:0.8 Age: 70 Weight:101.6 kg  Prescription refilled

## 2022-02-28 ENCOUNTER — Encounter (HOSPITAL_BASED_OUTPATIENT_CLINIC_OR_DEPARTMENT_OTHER): Payer: Self-pay | Admitting: Emergency Medicine

## 2022-02-28 ENCOUNTER — Inpatient Hospital Stay (HOSPITAL_BASED_OUTPATIENT_CLINIC_OR_DEPARTMENT_OTHER)
Admission: EM | Admit: 2022-02-28 | Discharge: 2022-03-02 | DRG: 309 | Disposition: A | Discharge status: Home / Self Care | Payer: No Typology Code available for payment source | Attending: Family Medicine | Admitting: Family Medicine

## 2022-02-28 ENCOUNTER — Other Ambulatory Visit: Payer: Self-pay

## 2022-02-28 DIAGNOSIS — I110 Hypertensive heart disease with heart failure: Secondary | ICD-10-CM | POA: Diagnosis not present

## 2022-02-28 DIAGNOSIS — Z825 Family history of asthma and other chronic lower respiratory diseases: Secondary | ICD-10-CM | POA: Diagnosis not present

## 2022-02-28 DIAGNOSIS — M1990 Unspecified osteoarthritis, unspecified site: Secondary | ICD-10-CM | POA: Diagnosis present

## 2022-02-28 DIAGNOSIS — Z79899 Other long term (current) drug therapy: Secondary | ICD-10-CM | POA: Diagnosis not present

## 2022-02-28 DIAGNOSIS — E876 Hypokalemia: Secondary | ICD-10-CM | POA: Diagnosis present

## 2022-02-28 DIAGNOSIS — I4891 Unspecified atrial fibrillation: Principal | ICD-10-CM | POA: Diagnosis present

## 2022-02-28 DIAGNOSIS — I5032 Chronic diastolic (congestive) heart failure: Secondary | ICD-10-CM | POA: Diagnosis not present

## 2022-02-28 DIAGNOSIS — I10 Essential (primary) hypertension: Secondary | ICD-10-CM | POA: Diagnosis present

## 2022-02-28 DIAGNOSIS — Z8249 Family history of ischemic heart disease and other diseases of the circulatory system: Secondary | ICD-10-CM

## 2022-02-28 DIAGNOSIS — I4819 Other persistent atrial fibrillation: Secondary | ICD-10-CM | POA: Diagnosis not present

## 2022-02-28 DIAGNOSIS — Z7901 Long term (current) use of anticoagulants: Secondary | ICD-10-CM

## 2022-02-28 DIAGNOSIS — J4520 Mild intermittent asthma, uncomplicated: Secondary | ICD-10-CM | POA: Diagnosis not present

## 2022-02-28 LAB — BASIC METABOLIC PANEL
Anion gap: 10 (ref 5–15)
BUN: 18 mg/dL (ref 8–23)
CO2: 25 mmol/L (ref 22–32)
Calcium: 9.6 mg/dL (ref 8.9–10.3)
Chloride: 103 mmol/L (ref 98–111)
Creatinine, Ser: 0.97 mg/dL (ref 0.44–1.00)
GFR, Estimated: >60 mL/min (ref >60)
Glucose, Bld: 114 mg/dL — ABNORMAL HIGH (ref 70–99)
Potassium: 3.6 mmol/L (ref 3.5–5.1)
Sodium: 138 mmol/L (ref 135–145)

## 2022-02-28 LAB — CBC WITH DIFFERENTIAL/PLATELET
Abs Immature Granulocytes: 0.01 10*3/uL (ref 0.00–0.07)
Basophils Absolute: 0.1 10*3/uL (ref 0.0–0.1)
Basophils Relative: 1 %
Eosinophils Absolute: 0.1 10*3/uL (ref 0.0–0.5)
Eosinophils Relative: 1 %
HCT: 39.3 % (ref 36.0–46.0)
Hemoglobin: 13.3 g/dL (ref 12.0–15.0)
Immature Granulocytes: 0 %
Lymphocytes Relative: 48 %
Lymphs Abs: 3.3 10*3/uL (ref 0.7–4.0)
MCH: 32.8 pg (ref 26.0–34.0)
MCHC: 33.8 g/dL (ref 30.0–36.0)
MCV: 96.8 fL (ref 80.0–100.0)
Monocytes Absolute: 0.6 10*3/uL (ref 0.1–1.0)
Monocytes Relative: 9 %
Neutro Abs: 2.9 10*3/uL (ref 1.7–7.7)
Neutrophils Relative %: 41 %
Platelets: 186 10*3/uL (ref 150–400)
RBC: 4.06 MIL/uL (ref 3.87–5.11)
RDW: 12.5 % (ref 11.5–15.5)
WBC: 7 10*3/uL (ref 4.0–10.5)
nRBC: 0 % (ref 0.0–0.2)

## 2022-02-28 LAB — TROPONIN I (HIGH SENSITIVITY)
Troponin I (High Sensitivity): 4 ng/L (ref <18)
Troponin I (High Sensitivity): 3 ng/L (ref <18)

## 2022-02-28 MED ORDER — DILTIAZEM HCL-DEXTROSE 125-5 MG/125ML-% IV SOLN (PREMIX)
5.0000 mg/h | INTRAVENOUS | Status: AC
  Administered 2022-02-28 – 2022-03-01 (×2): 5 mg/h via INTRAVENOUS
  Filled 2022-02-28 (×2): qty 125

## 2022-02-28 MED ORDER — ACETAMINOPHEN 325 MG PO TABS: 650.0000 mg | ORAL_TABLET | Freq: Four times a day (QID) | ORAL | Status: DC | PRN

## 2022-02-28 MED ORDER — APIXABAN 5 MG PO TABS
5.0000 mg | ORAL_TABLET | Freq: Two times a day (BID) | ORAL | Status: DC
  Administered 2022-03-01 – 2022-03-02 (×3): 5 mg via ORAL
  Filled 2022-02-28 (×3): qty 1

## 2022-02-28 MED ORDER — SODIUM CHLORIDE 0.9 % IV BOLUS
500.0000 mL | Freq: Once | INTRAVENOUS | Status: AC
  Administered 2022-02-28: 500 mL via INTRAVENOUS

## 2022-02-28 MED ORDER — ACETAMINOPHEN 650 MG RE SUPP: 650.0000 mg | Freq: Four times a day (QID) | RECTAL | Status: DC | PRN

## 2022-02-28 MED ORDER — DILTIAZEM LOAD VIA INFUSION
15.0000 mg | Freq: Once | INTRAVENOUS | Status: AC
  Administered 2022-02-28: 15 mg via INTRAVENOUS
  Filled 2022-02-28: qty 15

## 2022-02-28 NOTE — ED Notes (Signed)
Called [facility] to transport patient to [facility] 4W room# 0737 ?

## 2022-02-28 NOTE — ED Triage Notes (Signed)
Patient reports "her afib has been messing up."  Patient reports going to the dentist yesterday and her heart rate was elevated and it was elevated today as well.  Patient reports highest heart rate today was 116. ?

## 2022-02-28 NOTE — ED Provider Notes (Signed)
?[facility] EMERGENCY DEPT ?Provider Note ? ? ?CSN: [id number] ?Arrival date & time: 02/28/22  1853 ? ?  ? ?History ? ?Chief Complaint  ?Patient presents with  ? Atrial Fibrillation  ? ? ?[name] [name] is a 71 y.o. female. ? ?Patient presents ER chief complaint of rapid heart rate.  She states she had no symptoms was feeling fine today went to see a dentist appointment yesterday and was told that her heart rate was too rapid.  She checked it again today and it continued to be rapid so she presents to the ER.  Otherwise denies any chest pain or shortness of breath no fever no cough no vomiting or diarrhea.  She states she has a history of paroxysmal atrial fibrillation she continues taking her diltiazem continues taking her Eliquis.  She states at baseline she is in sinus rhythm to the last of her knowledge until she discovered yesterday that she is back in atrial fibrillation.  Otherwise has not had any symptoms and does not know how long she has been in A-fib. ? ? ?  ? ?Home Medications ?Prior to Admission medications   ?Medication Sig Start Date End Date Taking? Authorizing Provider  ?acetaminophen (TYLENOL) 500 MG tablet Take 1,000 mg by mouth every 6 (six) hours as needed for mild pain.    [provider]  ?albuterol (PROVENTIL HFA;VENTOLIN HFA) 108 (90 Base) MCG/ACT inhaler Inhale 2 puffs into the lungs every 6 (six) hours as needed for wheezing or shortness of breath.     [provider]  ?apixaban (ELIQUIS) 5 MG TABS tablet Take 1 tablet by mouth twice daily 10/23/21   Hilty, Lisette Abu, MD  ?B Complex-C (B-COMPLEX WITH VITAMIN C) tablet Take 1 tablet by mouth daily.    [provider]  ?busPIRone (BUSPAR) 5 MG tablet Take 5 mg by mouth 2 (two) times daily.     [provider]  ?cholecalciferol (VITAMIN D) 1000 units tablet Take 2,000 Units by mouth daily.    [provider]  ?diltiazem (CARTIA XT) 120 MG 24 hr capsule Take 1 capsule (120 mg total) by  mouth daily. 09/01/21   Ronney Asters, NP  ?Multiple Vitamin (MULTIVITAMIN WITH MINERALS) TABS tablet Take 1 tablet by mouth daily.    [provider]  ?telmisartan-hydrochlorothiazide (MICARDIS HCT) 80-25 MG tablet Take 1 tablet by mouth daily.    [provider]  ?   ? ?Allergies    ?Patient has no known allergies.   ? ?Review of Systems   ?Review of Systems  ?Constitutional:  Negative for fever.  ?HENT:  Negative for ear pain.   ?Eyes:  Negative for pain.  ?Respiratory:  Negative for cough.   ?Cardiovascular:  Negative for chest pain.  ?Gastrointestinal:  Negative for abdominal pain.  ?Genitourinary:  Negative for flank pain.  ?Musculoskeletal:  Negative for back pain.  ?Skin:  Negative for rash.  ?Neurological:  Negative for headaches.  ? ?Physical Exam ?Updated Vital Signs ?BP 102/79   Pulse 93   Temp (!) 97.4 ?F (36.3 ?C) (Temporal)   Resp 17   Ht 5\' 4"  (1.626 m)   Wt 102.1 kg   SpO2 97%   BMI 38.62 kg/m?  ?Physical Exam ?Constitutional:   ?   General: She is not in acute distress. ?   Appearance: Normal appearance.  ?HENT:  ?   Head: Normocephalic.  ?   Nose: Nose normal.  ?Eyes:  ?   Extraocular Movements: Extraocular movements intact.  ?  Cardiovascular:  ?   Rate and Rhythm: Tachycardia present. Rhythm irregular.  ?Pulmonary:  ?   Effort: Pulmonary effort is normal.  ?Musculoskeletal:     ?   General: Normal range of motion.  ?   Cervical back: Normal range of motion.  ?Neurological:  ?   General: No focal deficit present.  ?   Mental Status: She is alert. Mental status is at baseline.  ? ? ?ED Results / Procedures / Treatments   ?Labs ?(all labs ordered are listed, but only abnormal results are displayed) ?Labs Reviewed  ?BASIC METABOLIC PANEL - Abnormal; Notable for the following components:  ?    Result Value  ? Glucose, Bld 114 (*)   ? All other components within normal limits  ?CBC WITH DIFFERENTIAL/PLATELET  ?TROPONIN I (HIGH SENSITIVITY)  ? ? ?EKG ?None ? ?Radiology ?No  results found. ? ?Procedures ?[facility] KitchenCritical Care ?Performed by: Cheryll Cockayne, MD ?Authorized by: Cheryll Cockayne, MD  ? ?Critical care provider statement:  ?  Critical care time (minutes):  40 ?  Critical care time was exclusive of:  Separately billable procedures and treating other patients and teaching time ?  Critical care was necessary to treat or prevent imminent or life-threatening deterioration of the following conditions:  Cardiac failure  ? ? ?Medications Ordered in ED ?Medications  ?diltiazem (CARDIZEM) 1 mg/mL load via infusion 15 mg (15 mg Intravenous Bolus from Bag 02/28/22 2029)  ?  And  ?diltiazem (CARDIZEM) 125 mg in dextrose 5% 125 mL (1 mg/mL) infusion (5 mg/hr Intravenous New Bag/Given 02/28/22 2029)  ?sodium chloride 0.9 % bolus 500 mL (500 mLs Intravenous New Bag/Given 02/28/22 2028)  ? ? ?ED Course/ Medical Decision Making/ A&P ?  ?                        ?Medical Decision Making ?Amount and/or Complexity of Data Reviewed ?Labs: ordered. ? ?Risk ?Prescription drug management. ? ? ?Chart review shows office visit September 01, 2021.  Paroxysmal atrial fibrillation. ? ?Cardiac monitor shows atrial fibrillation with rapid ventricular rate, heart rate about 130 to 150 bpm. ? ?Work-up includes CBC CMP these are normal. ? ?Patient given IV fluid and IV diltiazem with good response.  Heart rates come down to about 90 to 110 bpm. ? ?Case discussed with hospitalist for admission for atrial fibrillation with rapid ventricular rate. ? ? ? ? ? ? ? ?Final Clinical Impression(s) / ED Diagnoses ?Final diagnoses:  ?Atrial fibrillation with rapid ventricular response (HCC)  ? ? ?Rx / DC Orders ?ED Discharge Orders   ? ? None  ? ?  ? ? ?  ?Cheryll Cockayne, MD ?02/28/22 2105 ? ?

## 2022-02-28 NOTE — Progress Notes (Signed)
Plan of Care Note for accepted transfer ? ? ?Patient: [name] [name] MRN: [id number]   DOA: 02/28/2022 ? ?Facility requesting transfer: [facility] ?Requesting Provider: Dr. Audley Hose ?Reason for transfer: Rapid Atrial Fibrillation ?Facility course:  ? ?71 year old female with past medical history of hypertension, asthma, paroxysmal atrial fibrillation on Eliquis and diltiazem who presented to [facility] emergency department after being told by her dentist that she had an elevated heart rate. ? ?Patient reports that she is compliant with her home regimen of oral diltiazem. ? ?Upon evaluation in the emergency department patient was found to be in rapid atrial fibrillation.  Serial troponins were found to be unremarkable.  Patient was initiated on a diltiazem infusion. ? ?ER provider requesting hospitalization for continued management of rapid atrial fibrillation. ? ? ?Plan of care: ?The patient is accepted for admission to Telemetry unit, at [facility]..  ? ? ?Author: ?Marinda Elk, MD ?02/28/2022 ? ?Check [email or web address] for on-call coverage. ? ?Nursing staff, Please call [facility] Admits & Consults System-Wide number on Amion as soon as patient's arrival, so appropriate admitting provider can evaluate the pt. ?

## 2022-03-01 ENCOUNTER — Encounter (HOSPITAL_COMMUNITY): Payer: Self-pay | Admitting: Internal Medicine

## 2022-03-01 ENCOUNTER — Observation Stay (HOSPITAL_COMMUNITY): Payer: No Typology Code available for payment source

## 2022-03-01 DIAGNOSIS — Z7901 Long term (current) use of anticoagulants: Secondary | ICD-10-CM | POA: Diagnosis not present

## 2022-03-01 DIAGNOSIS — Z79899 Other long term (current) drug therapy: Secondary | ICD-10-CM | POA: Diagnosis not present

## 2022-03-01 DIAGNOSIS — I5032 Chronic diastolic (congestive) heart failure: Secondary | ICD-10-CM | POA: Diagnosis present

## 2022-03-01 DIAGNOSIS — Z8249 Family history of ischemic heart disease and other diseases of the circulatory system: Secondary | ICD-10-CM | POA: Diagnosis not present

## 2022-03-01 DIAGNOSIS — E876 Hypokalemia: Secondary | ICD-10-CM | POA: Diagnosis present

## 2022-03-01 DIAGNOSIS — I4891 Unspecified atrial fibrillation: Secondary | ICD-10-CM | POA: Diagnosis present

## 2022-03-01 DIAGNOSIS — J4520 Mild intermittent asthma, uncomplicated: Secondary | ICD-10-CM | POA: Diagnosis present

## 2022-03-01 DIAGNOSIS — I10 Essential (primary) hypertension: Secondary | ICD-10-CM | POA: Diagnosis not present

## 2022-03-01 DIAGNOSIS — Z825 Family history of asthma and other chronic lower respiratory diseases: Secondary | ICD-10-CM | POA: Diagnosis not present

## 2022-03-01 DIAGNOSIS — I4819 Other persistent atrial fibrillation: Secondary | ICD-10-CM | POA: Diagnosis present

## 2022-03-01 DIAGNOSIS — I110 Hypertensive heart disease with heart failure: Secondary | ICD-10-CM | POA: Diagnosis present

## 2022-03-01 DIAGNOSIS — M1990 Unspecified osteoarthritis, unspecified site: Secondary | ICD-10-CM | POA: Diagnosis present

## 2022-03-01 LAB — COMPREHENSIVE METABOLIC PANEL
ALT: 14 U/L (ref 0–44)
AST: 14 U/L — ABNORMAL LOW (ref 15–41)
Albumin: 3.6 g/dL (ref 3.5–5.0)
Alkaline Phosphatase: 40 U/L (ref 38–126)
Anion gap: 7 (ref 5–15)
BUN: 19 mg/dL (ref 8–23)
CO2: 27 mmol/L (ref 22–32)
Calcium: 8.6 mg/dL — ABNORMAL LOW (ref 8.9–10.3)
Chloride: 106 mmol/L (ref 98–111)
Creatinine, Ser: 0.9 mg/dL (ref 0.44–1.00)
GFR, Estimated: >60 mL/min (ref >60)
Glucose, Bld: 114 mg/dL — ABNORMAL HIGH (ref 70–99)
Potassium: 3.2 mmol/L — ABNORMAL LOW (ref 3.5–5.1)
Sodium: 140 mmol/L (ref 135–145)
Total Bilirubin: 0.8 mg/dL (ref 0.3–1.2)
Total Protein: 6.4 g/dL — ABNORMAL LOW (ref 6.5–8.1)

## 2022-03-01 LAB — CBC WITH DIFFERENTIAL/PLATELET
Abs Immature Granulocytes: 0 10*3/uL (ref 0.00–0.07)
Basophils Absolute: 0.1 10*3/uL (ref 0.0–0.1)
Basophils Relative: 1 %
Eosinophils Absolute: 0.1 10*3/uL (ref 0.0–0.5)
Eosinophils Relative: 1 %
HCT: 36.9 % (ref 36.0–46.0)
Hemoglobin: 12.5 g/dL (ref 12.0–15.0)
Immature Granulocytes: 0 %
Lymphocytes Relative: 48 %
Lymphs Abs: 2.9 10*3/uL (ref 0.7–4.0)
MCH: 33.9 pg (ref 26.0–34.0)
MCHC: 33.9 g/dL (ref 30.0–36.0)
MCV: 100 fL (ref 80.0–100.0)
Monocytes Absolute: 0.6 10*3/uL (ref 0.1–1.0)
Monocytes Relative: 9 %
Neutro Abs: 2.4 10*3/uL (ref 1.7–7.7)
Neutrophils Relative %: 41 %
Platelets: 149 10*3/uL — ABNORMAL LOW (ref 150–400)
RBC: 3.69 MIL/uL — ABNORMAL LOW (ref 3.87–5.11)
RDW: 12.6 % (ref 11.5–15.5)
WBC: 6 10*3/uL (ref 4.0–10.5)
nRBC: 0 % (ref 0.0–0.2)

## 2022-03-01 LAB — URINALYSIS, COMPLETE (UACMP) WITH MICROSCOPIC
Bilirubin Urine: NEGATIVE
Glucose, UA: NEGATIVE mg/dL
Hgb urine dipstick: NEGATIVE
Ketones, ur: NEGATIVE mg/dL
Leukocytes,Ua: NEGATIVE
Nitrite: NEGATIVE
Protein, ur: NEGATIVE mg/dL
Specific Gravity, Urine: 1.019 (ref 1.005–1.030)
pH: 6 (ref 5.0–8.0)

## 2022-03-01 LAB — RAPID URINE DRUG SCREEN, HOSP PERFORMED
Amphetamines: NOT DETECTED
Barbiturates: NOT DETECTED
Benzodiazepines: NOT DETECTED
Cocaine: NOT DETECTED
Opiates: NOT DETECTED
Tetrahydrocannabinol: NOT DETECTED

## 2022-03-01 LAB — ECHOCARDIOGRAM COMPLETE
AR max vel: 2.81 cm2
AV Peak grad: 6.1 mmHg
Ao pk vel: 1.23 m/s
Area-P 1/2: 3.81 cm2
Calc EF: 61.6 %
Height: 64 in
MV M vel: 4.15 m/s
MV Peak grad: 69 mmHg
P 1/2 time: 518 msec
S' Lateral: 2.6 cm
Single Plane A2C EF: 61.3 %
Single Plane A4C EF: 61.6 %
Weight: 3594.38 oz

## 2022-03-01 LAB — MAGNESIUM
Magnesium: 2 mg/dL (ref 1.7–2.4)
Magnesium: 2 mg/dL (ref 1.7–2.4)

## 2022-03-01 LAB — TSH: TSH: 2.689 u[IU]/mL (ref 0.350–4.500)

## 2022-03-01 LAB — BRAIN NATRIURETIC PEPTIDE: B Natriuretic Peptide: 202.6 pg/mL — ABNORMAL HIGH (ref 0.0–100.0)

## 2022-03-01 MED ORDER — POTASSIUM CHLORIDE CRYS ER 20 MEQ PO TBCR
40.0000 meq | EXTENDED_RELEASE_TABLET | ORAL | Status: AC
  Administered 2022-03-01 (×2): 40 meq via ORAL
  Filled 2022-03-01 (×2): qty 2

## 2022-03-01 MED ORDER — LEVALBUTEROL HCL 1.25 MG/0.5ML IN NEBU: 1.2500 mg | INHALATION_SOLUTION | RESPIRATORY_TRACT | Status: DC | PRN

## 2022-03-01 MED ORDER — DILTIAZEM HCL 60 MG PO TABS
60.0000 mg | ORAL_TABLET | Freq: Three times a day (TID) | ORAL | Status: DC
Start: 2022-03-01 — End: 2022-03-02
  Administered 2022-03-01 – 2022-03-02 (×3): 60 mg via ORAL
  Filled 2022-03-01 (×3): qty 1

## 2022-03-01 NOTE — Assessment & Plan Note (Signed)
? ?#)   Mild intermittent asthma: Documented history of such, without any clinical evidence to suggest acute asthma exacerbation at this time.  Outpatient respiratory regimen limited to as needed albuterol inhaler. ? ?Plan: Add on serum magnesium level.  In the setting of presenting atrial fibrillation with RVR, will hold home prn albuterol inhaler for now, and proceed with prn Xopenex nebulizer to reduce risk for crossover beta-1 agonist and exacerbating RVR. ? ? ? ?

## 2022-03-01 NOTE — Assessment & Plan Note (Signed)
? ?#)   Essential Hypertension: documented h/o such, with outpatient antihypertensive regimen including telmisartan, HCTZ.  SBP's in the ED today: In the low 100s to 110s mmHg. Will hold home antihypertensive medications while remaining on diltiazem drip, to reduce risk for associated hypotension.  ? ?Plan: Close monitoring of subsequent BP via routine VS. holding home and hypertensive medications for now. ? ? ?

## 2022-03-01 NOTE — Assessment & Plan Note (Signed)
? ? ?#)   Chronic diastolic heart failure: documented history of such, with most recent echocardiogram performed in March 2017, with results at that time notable for grade 2 diastolic dysfunction, with additional results as conveyed above. No clinical evidence to suggest acutely decompensated heart failure at this time. home diuretic regimen reportedly consists of the following: HCTZ.  ? ? ?Plan: monitor strict I's & O's and daily weights. Repeat BMP in AM. Check serum mag level.  Holding home HCTZ for now.  Echocardiogram ordered for the morning, including to further evaluate presenting atrial fibrillation with RVR.  Chest x-ray.  Add on BNP. ? ? ? ?

## 2022-03-01 NOTE — Progress Notes (Signed)
? ?Cardiology Consultation:  ? ?Patient ID: [name] [name]; [id number]; [birth year]  ? ?Admit date: 02/28/2022 ?Date of Consult: 03/01/2022 ? ?Primary Care Provider: Mirna MiresHill, Gerald, MD ?Primary Cardiologist: Chrystie NoseKenneth C Hilty, MD  ?Primary Electrophysiologist:  NA ? ? ?Patient Profile:  ? ?[name] [name] is a 71 y.o. female with a hx of atrial fib  who is being seen today for the evaluation of atrial fib  at the request of Dr. Sharl MaLama. ? ?History of Present Illness:  ?  ?[name] has a history of atrial fibrillation with RVR.  This appears to have started in 2017.  She has been chronically anticoagulated.  She has been followed by [name].  She has been having some increasing palpitations at the last visit but these seem to be paroxysmal.  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   ? ?She presented today because of atrial fib with rapid rate. Marland Kitchen.  She went to the dentist on Tuesday and she was found to have rapid heart rate.  Her heart rate typically is in the 60s.  She said she started checking it at home and she found it to be in the 110s.  She was not feeling palpitations.  She was not having any presyncope or syncope.  She did not have any chest pressure, neck or arm discomfort.  She went to draw a bridge where she was found to be in atrial fibrillation.  Her heart rate on EKG was in the 130s 150s.  She was treated with IV Cardizem.  Enzymes were negative.  She did have an echocardiogram today.  This demonstrated normal systolic function.  She has moderately elevated left atrial size.  There is mild mitral regurgitation.  There was aortic valve sclerosis.  She previously noted to have some diastolic dysfunction. ? ?She says she has been feeling well.  She does not get around as much as she used to because she injured her ankle sometime ago and still has some swelling with that.  However, she does all of her chores of daily living. The patient denies any new symptoms such as chest discomfort, neck or arm discomfort. There has  been no new shortness of breath, PND or orthopnea. There have been no reported palpitations, presyncope or syncope. She said she usually feels her palpitations but she would not have known she was in fibrillation at this time. ? ? ?Past Medical History:  ?Diagnosis Date  ? A-fib (HCC)   ? Anemia   ? during pneumonia  ? Arthritis   ? Asthma   ? Hypertension   ? Pneumonia   ? ? ?Past Surgical History:  ?Procedure Laterality Date  ? c section    ? 1973 and 1983  ? COLONOSCOPY    ? ORIF ANKLE FRACTURE Left 08/16/2016  ? ORIF ANKLE FRACTURE Left 08/16/2016  ? Procedure: OPEN REDUCTION INTERNAL FIXATION (ORIF) LEFT BIMALLEOLAR ANKLE FRACTURE;  Surgeon: Kathryne Hitchhristopher Y Blackman, MD;  Location: [facility] OR;  Service: Orthopedics;  Laterality: Left;  ? TUBAL LIGATION    ?  ? ?Home Medications:  ?Prior to Admission medications   ?Medication Sig Start Date End Date Taking? Authorizing Provider  ?acetaminophen (TYLENOL) 500 MG tablet Take 1,000 mg by mouth every 6 (six) hours as needed for mild pain.   Yes [provider]  ?apixaban (ELIQUIS) 5 MG TABS tablet Take 1 tablet by mouth twice daily ?Patient taking differently: Take 5 mg by mouth 2 (two) times daily. 10/23/21  Yes Zoila ShutterHilty, Kenneth  C, MD  ?busPIRone (BUSPAR) 5 MG tablet Take 5 mg by mouth 2 (two) times daily.    Yes [provider]  ?cholecalciferol (VITAMIN D) 1000 units tablet Take 2,000 Units by mouth daily.   Yes [provider]  ?diltiazem (CARTIA XT) 120 MG 24 hr capsule Take 1 capsule (120 mg total) by mouth daily. 09/01/21  Yes Ronney Asters, NP  ?Multiple Vitamin (MULTIVITAMIN WITH MINERALS) TABS tablet Take 1 tablet by mouth daily.   Yes [provider]  ?telmisartan-hydrochlorothiazide (MICARDIS HCT) 80-25 MG tablet Take 1 tablet by mouth daily.   Yes [provider]  ? ? ?Inpatient Medications: ?Scheduled Meds: ? apixaban  5 mg Oral BID  ? ?Continuous Infusions: ? diltiazem (CARDIZEM) infusion 5 mg/hr (03/01/22 1547)   ? ?PRN Meds: ?acetaminophen **OR** acetaminophen, levalbuterol ? ?Allergies:   No Known Allergies ? ?Social History:   ?Social History  ? ?Socioeconomic History  ? Marital status: Single  ?  Spouse name: Not on file  ? Number of children: Not on file  ? Years of education: Not on file  ? Highest education level: Not on file  ?Occupational History  ? Not on file  ?Tobacco Use  ? Smoking status: Never  ? Smokeless tobacco: Never  ?Vaping Use  ? Vaping Use: Never used  ?Substance and Sexual Activity  ? Alcohol use: No  ? Drug use: No  ? Sexual activity: Not Currently  ?Other Topics Concern  ? Not on file  ?Social History Narrative  ? Not on file  ? ?Social Determinants of Health  ? ?Financial Resource Strain: Not on file  ?Food Insecurity: Not on file  ?Transportation Needs: Not on file  ?Physical Activity: Not on file  ?Stress: Not on file  ?Social Connections: Not on file  ?Intimate Partner Violence: Not on file  ?  ?Family History:   ? ?Family History  ?Problem Relation Age of Onset  ? Atrial fibrillation Mother   ? Hypertension Mother   ? Asthma Father   ? Heart attack Father   ?  ? ?ROS:  ?Please see the history of present illness.  ?ROS  ?All other ROS reviewed and negative.    ? ?Physical Exam/Data:  ? ?Vitals:  ? 03/01/22 0317 03/01/22 0500 03/01/22 0843 03/01/22 1323  ?BP: 105/75  103/82 122/84  ?Pulse: 75  74 81  ?Resp: (!) 22  18 20   ?Temp: (!) 97.2 ?F (36.2 ?C)  (!) 97.3 ?F (36.3 ?C) 98.5 ?F (36.9 ?C)  ?TempSrc:    Oral  ?SpO2: 96%  98% 97%  ?Weight:  101.9 kg    ?Height:      ? ? ?Intake/Output Summary (Last 24 hours) at 03/01/2022 1622 ?Last data filed at 03/01/2022 05/01/2022 ?Gross per 24 hour  ?Intake 772.54 ml  ?Output --  ?Net 772.54 ml  ? ?Filed Weights  ? 02/28/22 1902 03/01/22 0500  ?Weight: 102.1 kg 101.9 kg  ? ?Body mass index is 38.56 kg/m?.  ?GENERAL:  Well appearing ?HEENT:  Pupils equal round and reactive, fundi not visualized, oral mucosa unremarkable ?NECK:  No jugular venous distention, waveform  within normal limits, carotid upstroke brisk and symmetric, no bruits, no thyromegaly ?LYMPHATICS:  No cervical, inguinal adenopathy ?LUNGS:  Clear to auscultation bilaterally ?BACK:  No CVA tenderness ?CHEST:  Unremarkable ?HEART:  PMI not displaced or sustained,S1 and S2 within normal limits, no S3, no clicks, no rubs, no murmurs, irregular ?ABD:  Flat, positive bowel sounds normal in frequency  in pitch, no bruits, no rebound, no guarding, no midline pulsatile mass, no hepatomegaly, no splenomegaly ?EXT:  2 plus pulses throughout, no edema, no cyanosis no clubbing ?SKIN:  No rashes no nodules ?NEURO:  Cranial nerves II through XII grossly intact, motor grossly intact throughout ?PSYCH:  Cognitively intact, oriented to person place and time ? ? ? ?EKG:  The EKG was personally reviewed and demonstrates: Atrial fibrillation, rate 78, axis within normal limits, intervals within normal limits, low voltage borderline limb leads, no acute ST-T wave changes. ?Telemetry:  Telemetry was personally reviewed and demonstrates:   ? ?Relevant CV Studies: ? ? ?ECHO:   ? ?1. Left ventricular ejection fraction, by estimation, is 60 to 65%. Left  ?ventricular ejection fraction by 2D MOD biplane is 61.6 %. The left  ?ventricle has normal function. The left ventricle has no regional wall  ?motion abnormalities. There is mild left  ?ventricular hypertrophy. Left ventricular diastolic function could not be  ?evaluated.  ? 2. Right ventricular systolic function is normal. The right ventricular  ?size is normal. There is mildly elevated pulmonary artery systolic  ?pressure. The estimated right ventricular systolic pressure is 36.3 mmHg.  ? 3. Left atrial size was moderately dilated.  ? 4. The mitral valve is grossly normal. Mild mitral valve regurgitation.  ? 5. The aortic valve is tricuspid. Aortic valve regurgitation is trivial.  ?Aortic valve sclerosis is present, with no evidence of aortic valve  ?stenosis. Aortic regurgitation PHT  measures 518 msec.  ? 6. The inferior vena cava is dilated in size with >50% respiratory  ?variability, suggesting right atrial pressure of 8 mmHg.  ? ?Laboratory Data: ? ?Chemistry ?Recent Labs  ?Lab 04/0

## 2022-03-01 NOTE — H&P (Signed)
?History and Physical  ? ? ?PLEASE NOTE THAT DRAGON DICTATION SOFTWARE WAS USED IN THE CONSTRUCTION OF THIS NOTE. ? ? ?[name] [name] LKG:[id number] DOB: 1951/04/10 DOA: 02/28/2022 ? ?PCP: Mirna [name], Gerald, MD  ?Patient coming from: home  ? ?I have personally briefly reviewed patient's old medical records [facility] ? ?Chief Complaint: Elevated heart rate ? ?HPI: [name] [name] is a 71 y.o. female with medical history significant for paroxysmal atrial fibrillation chronically anticoagulated on Eliquis, chronic diastolic heart failure, essential hypertension, mild intermittent asthma, who is admitted to [facility] on 02/28/2022 by way of transfer [facility] with incidentally noted atrial fibrillation with RVR after presenting from home to the latter facility for evaluation of elevated heart rate. ? ?The patient conveys that she was in her normal state of health when she attended a routine dental cleaning earlier in the day at which time routine vital signs performed by her dentist revealed atrial fibrillation with RVR, prompting dentist who recommended the patient present to the local emergency department for further evaluation and management thereof. ? ?The patient confirms a known history of paroxysmal atrial fibrillation, for which she reports good compliance via her chronic anticoagulation on Eliquis.  She also notes good compliance with her outpatient AV nodal blocking regimen, which is limited to diltiazem 120 mg p.o. daily.  Denies any recent missed doses relating to this regimen, nor any recent associated dose modifications. ? ?She denies any recent chest pain, shortness of breath, palpitations, diaphoresis, nausea, vomiting, dizziness, presyncope, or syncope.  No recent trauma.  Denies any recent worsening of peripheral edema, nor any new onset calf tenderness or new onset lower extremity erythema.  Not associated with any recent subjective fever, chills, rigors, or  generalized myalgias.  No recent cough, abdominal pain, diarrhea, rash.  She also notes no recent dysuria or gross hematuria. ? ?Per chart review, most recent echocardiogram occurred in March 2017, normal left ventricular cavity size as well as normal left ventricular wall thickness, will demonstrating LVEF 60 to 65%, no evidence of focal wall motion abnormalities, while showing grade 2 diastolic dysfunction, trivial aortic regurgitation, and mild left atrial dilation.  Aside from HCTZ, she denies use of any scheduled diuretic medications as an outpatient. ? ? ? ?[facility] ED Course:  ?Vital signs in the ED were notable for the following: Atrial fibrillation; heart rate initially in the 130s to 150s, with subsequent improvement into the low 100s following initiation of IV diltiazem, including diltiazem drip, as further detailed below; blood pressure 102/79 - 110/85; respiratory rate 16-22, oxygen saturation 96 to 100% on room air. ? ?Labs were notable for the following: BMP is notable for the following: Sodium 130, potassium 3.6, and 0.97.  High-sensitivity troponin I initially noted to be 4, with repeat value trending down to 3.  CBC notable for white cell count 7000 with hemoglobin 13.3. ? ?Imaging and additional notable ED work-up: EKG showed atrial fibrillation with RVR and ventricular rate 137, without any evidence of T wave or ST changes, including no evidence of ST elevation. ? ?While in the ED, the following were administered: Diltiazem 15 mg IV bolus followed by initiation of diltiazem drip; normal saline x500 cc bolus. ? ?Subsequently, the patient was transferred [facility] for [facility] observation for further evaluation and management of her presenting incidentally noted atrial fibrillation with RVR in the context of a known history thereof. ? ? ? ?Review of Systems: As per HPI otherwise 10 point review  of systems negative.  ? ?Past Medical History:  ?Diagnosis Date  ? A-fib (HCC)   ? Anemia   ?  during pneumonia  ? Arthritis   ? Asthma   ? Hypertension   ? Pneumonia   ? ? ?Past Surgical History:  ?Procedure Laterality Date  ? c section    ? 1973 and 1983  ? COLONOSCOPY    ? ORIF ANKLE FRACTURE Left 08/16/2016  ? ORIF ANKLE FRACTURE Left 08/16/2016  ? Procedure: OPEN REDUCTION INTERNAL FIXATION (ORIF) LEFT BIMALLEOLAR ANKLE FRACTURE;  Surgeon: Kathryne Hitch, MD;  Location: [facility] OR;  Service: Orthopedics;  Laterality: Left;  ? TUBAL LIGATION    ? ? ?Social History: ? reports that she has never smoked. She has never used smokeless tobacco. She reports that she does not drink alcohol and does not use drugs. ? ? ?No Known Allergies ? ?Family History  ?Problem Relation Age of Onset  ? Atrial fibrillation Mother   ? Hypertension Mother   ? Asthma Father   ? Heart attack Father   ? ? ?Family history reviewed and not pertinent  ? ? ?Prior to Admission medications   ?Medication Sig Start Date End Date Taking? Authorizing Provider  ?acetaminophen (TYLENOL) 500 MG tablet Take 1,000 mg by mouth every 6 (six) hours as needed for mild pain.   Yes [provider]  ?albuterol (PROVENTIL HFA;VENTOLIN HFA) 108 (90 Base) MCG/ACT inhaler Inhale 2 puffs into the lungs every 6 (six) hours as needed for wheezing or shortness of breath.    Yes [provider]  ?apixaban (ELIQUIS) 5 MG TABS tablet Take 1 tablet by mouth twice daily 10/23/21  Yes Hilty, Lisette Abu, MD  ?busPIRone (BUSPAR) 5 MG tablet Take 5 mg by mouth 2 (two) times daily.    Yes [provider]  ?cholecalciferol (VITAMIN D) 1000 units tablet Take 2,000 Units by mouth daily.   Yes [provider]  ?diltiazem (CARTIA XT) 120 MG 24 hr capsule Take 1 capsule (120 mg total) by mouth daily. 09/01/21  Yes Ronney Asters, NP  ?Multiple Vitamin (MULTIVITAMIN WITH MINERALS) TABS tablet Take 1 tablet by mouth daily.   Yes [provider]  ?telmisartan-hydrochlorothiazide (MICARDIS HCT) 80-25 MG tablet Take 1 tablet by mouth  daily.   Yes [provider]  ?B Complex-C (B-COMPLEX WITH VITAMIN C) tablet Take 1 tablet by mouth daily.    [provider]  ? ? ? ?Objective  ? ? ?Physical Exam: ?Vitals:  ? 02/28/22 2124 02/28/22 2130 02/28/22 2200 02/28/22 2309  ?BP: 105/82 106/79 110/85 117/90  ?Pulse: 90 88 93 70  ?Resp: 16 20 (!) 22 (!) 21  ?Temp:    97.8 ?F (36.6 ?C)  ?TempSrc:      ?SpO2: 98% 97% 97% 98%  ?Weight:      ?Height:      ? ? ?General: appears to be stated age; alert, oriented ?Skin: warm, dry, no rash ?Head:  AT/[place] ?Mouth:  Oral mucosa membranes appear moist, normal dentition ?Neck: supple; trachea midline ?Heart:  irregular; did not appreciate any M/R/G ?Lungs: CTAB, did not appreciate any wheezes, rales, or rhonchi ?Abdomen: + BS; soft, ND, NT ?Vascular: 2+ pedal pulses b/l; 2+ radial pulses b/l ?Extremities: no peripheral edema, no muscle wasting ?Neuro: strength and sensation intact in upper and lower extremities b/l ? ? ? ? ?Labs on Admission: I have personally reviewed following labs and imaging studies ? ?CBC: ?Recent Labs  ?Lab 02/28/22 ?1925  ?WBC  7.0  ?NEUTROABS 2.9  ?HGB 13.3  ?HCT 39.3  ?MCV 96.8  ?PLT 186  ? ?Basic Metabolic Panel: ?Recent Labs  ?Lab 02/28/22 ?1925  ?NA 138  ?K 3.6  ?CL 103  ?CO2 25  ?GLUCOSE 114*  ?BUN 18  ?CREATININE 0.97  ?CALCIUM 9.6  ? ?GFR: ?Estimated Creatinine Clearance: 63.7 mL/min (by C-G formula based on SCr of 0.97 mg/dL). ?Liver Function Tests: ?No results for input(s): AST, ALT, ALKPHOS, BILITOT, PROT, ALBUMIN in the last 168 hours. ?No results for input(s): LIPASE, AMYLASE in the last 168 hours. ?No results for input(s): AMMONIA in the last 168 hours. ?Coagulation Profile: ?No results for input(s): INR, PROTIME in the last 168 hours. ?Cardiac Enzymes: ?No results for input(s): CKTOTAL, CKMB, CKMBINDEX, TROPONINI in the last 168 hours. ?BNP (last 3 results) ?No results for input(s): PROBNP in the last 8760 hours. ?HbA1C: ?No results for input(s): HGBA1C in the last  72 hours. ?CBG: ?No results for input(s): GLUCAP in the last 168 hours. ?Lipid Profile: ?No results for input(s): CHOL, HDL, LDLCALC, TRIG, CHOLHDL, LDLDIRECT in the last 72 hours. ?Thyroid Function Tests: ?No result

## 2022-03-01 NOTE — Progress Notes (Signed)
[facility] ? ?PROGRESS NOTE ? ?[name] [name] [id number] DOB: 1951/04/01 DOA: 02/28/2022 ?PCP: Iona Beard, MD ? ? ?Brief HPI:   ?71 year old female with medical history of paroxysmal atrial fibrillation, chronically anticoagulated on Eliquis, chronic diastolic heart failure, essential hypertension, mild intermittent asthma who was admitted after she was incidentally noted to have A-fib with RVR at dentist office. ?Patient denies any symptoms.  She was started on Cardizem gtt. ? ? ? ?Subjective  ? ?Patient seen and examined, denies any complaints.  No chest pain or shortness of breath. ? ? Assessment/Plan:  ? ? ?Atrial fibrillation with RVR ?-Patient takes Cardizem 120 mg daily at home ?-Started on Cardizem gtt. 10 mg/h ?-Continue anticoagulation with Eliquis ?-Echocardiogram obtained today shows EF of 65 to [id number], diastolic function could not be determined due to underlying A-fib ?-Found to have hypokalemia, being replaced.  TSH 2.689 ?-BNP 200 ?-We will consult cardiology fo for r further recommendations ? ? ?Hypokalemia ?-Potassium is 3.2 ?-We will replace potassium and follow BMP in am ?-Serum magnesium is 2.0 ? ??  Diastolic heart failure ?-Chest x-ray shows increased vascular congestion ?-Might need Lasix ?-We will defer to cardiology ? ?Hypertension ?-Blood pressure is soft, ?-Telmisartan/HCTZ on hold ? ? ? ? ?Medications ? ?  ? apixaban  5 mg Oral BID  ? ? ? Data Reviewed:  ? ?CBG: ? ?No results for input(s): GLUCAP in the last 168 hours. ? ?SpO2: 97 %  ? ? ?Vitals:  ? 03/01/22 0317 03/01/22 0500 03/01/22 0843 03/01/22 1323  ?BP: 105/75  103/82 122/84  ?Pulse: 75  74 81  ?Resp: (!) 22  18 20   ?Temp: (!) 97.2 ?F (36.2 ?C)  (!) 97.3 ?F (36.3 ?C) 98.5 ?F (36.9 ?C)  ?TempSrc:    Oral  ?SpO2: 96%  98% 97%  ?Weight:  101.9 kg    ?Height:      ? ? ? ? ?Data Reviewed: ? ?Basic Metabolic Panel: ?Recent Labs  ?Lab 02/28/22 ?1925 03/01/22 ?0204  ?NA 138 140  ?K 3.6 3.2*  ?CL 103 106  ?CO2 25 27  ?GLUCOSE 114*  114*  ?BUN 18 19  ?CREATININE 0.97 0.90  ?CALCIUM 9.6 8.6*  ?MG  --  2.0  2.0  ? ? ?CBC: ?Recent Labs  ?Lab 02/28/22 ?1925 03/01/22 ?0204  ?WBC 7.0 6.0  ?NEUTROABS 2.9 2.4  ?HGB 13.3 12.5  ?HCT 39.3 36.9  ?MCV 96.8 100.0  ?PLT 186 149*  ? ? ?LFT ?Recent Labs  ?Lab 03/01/22 ?0204  ?AST 14*  ?ALT 14  ?ALKPHOS 40  ?BILITOT 0.8  ?PROT 6.4*  ?ALBUMIN 3.6  ? ?  ?Antibiotics: ?Anti-infectives (From admission, onward)  ? ? None  ? ?  ? ? ? ?DVT prophylaxis: Apixaban ? ?Code Status: Full code ? ?Family Communication: No family at bedside ? ? ?CONSULTS  ? ? ?Objective  ? ? ?Physical Examination: ? ? ?General-appears in no acute distress ?Heart-S1-S2, irregular, no murmur auscultated ?Lungs-clear to auscultation bilaterally, no wheezing or crackles auscultated ?Abdomen-soft, nontender, no organomegaly ?Extremities-trace edema in the lower extremities ?Neuro-alert, oriented x3, no focal deficit noted ? ? ?Status is: Inpatient: Atrial fibrillation with RVR ? ? ? ?  ? ? ?Oswald Hillock ?  ?[facility] ?If 7PM-7AM, please contact night-coverage at [email or web address], ?Office  [phone] ? ? ?03/01/2022, 4:16 PM  LOS: 0 days  ? ? ? ? ? ? ? ? ? ? ?  ?

## 2022-03-01 NOTE — Assessment & Plan Note (Signed)
? ?#)   Atrial fibrillation with RVR: In the setting of a known history of paroxysmal atrial fibrillation, the patient presents today in atrial fibrillation with ventricular rates in the 130s to 150s, which appears to have been incidentally noted by her dentist earlier in the day, in the absence of any acute symptoms, including no chest pain. of note, blood pressure appears to be tolerating these rates, including no interval hypotension following initiation of diltiazem drip at [facility] emergency department earlier today, following which there has been improvement in rate control, as further quantified above.  Source of exacerbation not entirely clear at this time, particular given the asymptomatic nature of patient's presentation. Presenting EKG while showing atrial fibrillation with RVR otherwise demonstrates no evidence of acute ischemic changes, including no evidence of ST elevation.  Additionally, high-sensitivity troponin I x2 values found to be nonelevated and trending down.  Overall, ACS is felt to be less likely at this time. ? ?No evidence of underlying infectious process at this time, although we will check urinalysis and chest x-ray to further evaluate.  No clinical evidence to suggest acutely decompensated heart failure, the patient is at risk for such, either as a precipitating factor leading to her atrial fibrillation with RVR or as a consequence thereof in the setting of relative decline in cardiac output with associated loss of atrial kick, particular in the context of a documented history of chronic diastolic heart failure. Will check chest x-ray as well as BNP to further evaluate.  ? ?Most recent echocardiogram occurred in March 2017 with findings notable for the following: LVEF 60 to 65%, grade 2 diastolic dysfunction, trivial aortic regurgitation, and mildly dilated left atrium. In the setting of a CHA2DS2-VASc score of 3, there is an indication for the patient to be on chronic anticoagulation  for thromboembolic prophylaxis. Consistent with this, the patient is chronically anticoagulated on Eliquis, with which the patient reports compliance. Home AV nodal blocking regimen: Limited to p.o. diltiazem, with which the patient reports good compliance.  ? ? ?Plan: Monitor strict I's and O's and daily weights. Monitor on telemetry. Check serum magnesium level. Repeat BMP in the AM.  Repeat CBC in the AM.  Continue diltiazem drip, titrated to goal HR of < 110 bpm.  Holding home p.o. diltiazem for now, with plan to resume home oral dose 1 to 2 hours prior to discontinuation of diltiazem drip to reduce risk for rebound tachycardia.  Continue home Eliquis.  Check TSH, urinary drug screen, urinalysis, chest x-ray.  Echocardiogram ordered for the morning.  Add on BNP.  Repeat EKG with improving rate control. ? ? ? ?

## 2022-03-02 LAB — BASIC METABOLIC PANEL
Anion gap: 7 (ref 5–15)
BUN: 16 mg/dL (ref 8–23)
CO2: 26 mmol/L (ref 22–32)
Calcium: 8.7 mg/dL — ABNORMAL LOW (ref 8.9–10.3)
Chloride: 107 mmol/L (ref 98–111)
Creatinine, Ser: 0.91 mg/dL (ref 0.44–1.00)
GFR, Estimated: >60 mL/min (ref >60)
Glucose, Bld: 105 mg/dL — ABNORMAL HIGH (ref 70–99)
Potassium: 4.1 mmol/L (ref 3.5–5.1)
Sodium: 140 mmol/L (ref 135–145)

## 2022-03-02 MED ORDER — DILTIAZEM HCL ER COATED BEADS 180 MG PO CP24
180.0000 mg | ORAL_CAPSULE | Freq: Every day | ORAL | Status: DC
  Administered 2022-03-02: 180 mg via ORAL
  Filled 2022-03-02: qty 1

## 2022-03-02 MED ORDER — DILTIAZEM HCL ER COATED BEADS 180 MG PO CP24: 180.0000 mg | ORAL_CAPSULE | Freq: Every day | ORAL | 3 refills | Status: DC

## 2022-03-02 NOTE — Plan of Care (Signed)

## 2022-03-02 NOTE — Discharge Summary (Signed)
?Physician Discharge Summary ?  ?Patient: [name] [name] MRN: [id number] DOB: 1951/05/01  ?Admit date:     02/28/2022  ?Discharge date: 03/02/22  ?Discharge Physician: Meredeth Ide  ? ?PCP: Mirna Mires, MD  ? ?Recommendations at discharge:  ? ?Follow-up cardiology in 2 weeks ? ?Discharge Diagnoses: ?Principal Problem: ?  Atrial fibrillation with rapid ventricular response (HCC) ?Active Problems: ?  Essential hypertension ?  Mild intermittent asthma ?  Chronic diastolic CHF (congestive heart failure) (HCC) ?  Atrial fibrillation (HCC) ? ?Resolved Problems: ?  * No resolved hospital problems. * ? ?Hospital Course: ?71 year old female with medical history of paroxysmal atrial fibrillation, chronically anticoagulated on Eliquis, chronic diastolic heart failure, essential hypertension, mild intermittent asthma who was admitted after she was incidentally noted to have A-fib with RVR at dentist office. ?Patient denies any symptoms.  She was started on Cardizem gtt. ? ?Assessment and Plan: ? ?Atrial fibrillation with RVR ?-Patient takes Cardizem 120 mg daily at home ?-Started on Cardizem gtt. 10 mg/h ?-Continue anticoagulation with Eliquis ?-Echocardiogram obtained today shows EF of 65 to 70%, diastolic function could not be determined due to underlying A-fib ?-  TSH 2.689 ?-BNP 200 ?-Cardiology was consulted, dose of Cardizem increased to 180 mg p.o. daily ?-We will discharge home and follow-up with cardiology as outpatient ?  ?  ?Hypokalemia ?-Replete ? ??  Diastolic heart failure ?-No diuretics as per cardiology ?  ?Hypertension ?-Blood pressure is soft, ?-Telmisartan/HCTZ has been discontinued ?-Continue taking Cardizem CD 180 mg p.o. daily ?  ? ? ? ? ? ?  ? ? ?Consultants: Cardiology ?Procedures performed:  ?Disposition: Home ?Diet recommendation:  ?Discharge Diet Orders (From admission, onward)  ? ?  Start     Ordered  ? 03/02/22 0000  Diet - low sodium heart healthy       ? 03/02/22 1545  ? ?  ?  ? ?  ? ?Regular  diet ?DISCHARGE MEDICATION: ?Allergies as of 03/02/2022   ?No Known Allergies ?  ? ?  ?Medication List  ?  ? ?STOP taking these medications   ? ?telmisartan-hydrochlorothiazide 80-25 MG tablet ?Commonly known as: MICARDIS HCT ?  ? ?  ? ?TAKE these medications   ? ?acetaminophen 500 MG tablet ?Commonly known as: TYLENOL ?Take 1,000 mg by mouth every 6 (six) hours as needed for mild pain. ?  ?busPIRone 5 MG tablet ?Commonly known as: BUSPAR ?Take 5 mg by mouth 2 (two) times daily. ?  ?cholecalciferol 1000 units tablet ?Commonly known as: VITAMIN D ?Take 2,000 Units by mouth daily. ?  ?diltiazem 180 MG 24 hr capsule ?Commonly known as: CARDIZEM CD ?Take 1 capsule (180 mg total) by mouth daily. ?Start taking on: March 03, 2022 ?What changed:  ?medication strength ?how much to take ?  ?Eliquis 5 MG Tabs tablet ?Generic drug: apixaban ?Take 1 tablet by mouth twice daily ?What changed: how much to take ?  ?multivitamin with minerals Tabs tablet ?Take 1 tablet by mouth daily. ?  ? ?  ? ? Follow-up Information   ? ? Chrystie Nose, MD Follow up in 2 week(s).   ?Specialty: Cardiology ?Contact information: ?[address] ?[address] ?[place] [address] ?[phone] ? ? ?  ?  ? ?  ?  ? ?  ? ?Discharge Exam: ?Filed Weights  ? 02/28/22 1902 03/01/22 0500 03/02/22 0500  ?Weight: 102.1 kg 101.9 kg 100.9 kg  ? ?General-appears in no acute distress ?Heart-S1-S2, regular, no murmur auscultated ?Lungs-clear to auscultation bilaterally,  no wheezing or crackles auscultated ?Abdomen-soft, nontender, no organomegaly ?Extremities-no edema in the lower extremities ?Neuro-alert, oriented x3, no focal deficit noted ? ?Condition at discharge: good ? ?The results of significant diagnostics from this hospitalization (including imaging, microbiology, ancillary and laboratory) are listed below for reference.  ? ?Imaging Studies: ?DG Chest Port 1 View ? ?Result Date: 03/01/2022 ?CLINICAL DATA:  Atrial fibrillation EXAM: PORTABLE CHEST 1 VIEW  COMPARISON:  02/11/2016 FINDINGS: Heart is normal size. Scarring at the left base. Right lung clear. No effusions or acute bony abnormality. IMPRESSION: Left base scarring.  No active disease. Electronically Signed   By: Charlett Nose M.D.   On: 03/01/2022 01:48  ? ?ECHOCARDIOGRAM COMPLETE ? ?Result Date: 03/01/2022 ?   ECHOCARDIOGRAM REPORT   Patient Name:   [name] [name] Date of Exam: 03/01/2022 Medical Rec #:  [id number]    Height:       64.0 in Accession #:    [id number]   Weight:       224.6 lb Date of Birth:  06/27/1951    BSA:          2.055 m? Patient Age:    70 years     BP:           117/90 mmHg Patient Gender: F            HR:           66 bpm. Exam Location:  Inpatient Procedure: 2D Echo, Cardiac Doppler and Color Doppler Indications:    Afib  History:        Patient has no prior history of Echocardiogram examinations.                 CHF, Arrythmias:Atrial Fibrillation; Risk Factors:Hypertension.  Sonographer:    Cleatis Polka Referring Phys: [id number] JUSTIN B HOWERTER IMPRESSIONS  1. Left ventricular ejection fraction, by estimation, is 60 to 65%. Left ventricular ejection fraction by 2D MOD biplane is 61.6 %. The left ventricle has normal function. The left ventricle has no regional wall motion abnormalities. There is mild left ventricular hypertrophy. Left ventricular diastolic function could not be evaluated.  2. Right ventricular systolic function is normal. The right ventricular size is normal. There is mildly elevated pulmonary artery systolic pressure. The estimated right ventricular systolic pressure is 36.3 mmHg.  3. Left atrial size was moderately dilated.  4. The mitral valve is grossly normal. Mild mitral valve regurgitation.  5. The aortic valve is tricuspid. Aortic valve regurgitation is trivial. Aortic valve sclerosis is present, with no evidence of aortic valve stenosis. Aortic regurgitation PHT measures 518 msec.  6. The inferior vena cava is dilated in size with >50% respiratory variability,  suggesting right atrial pressure of 8 mmHg. Comparison(s): No prior Echocardiogram. FINDINGS  Left Ventricle: Left ventricular ejection fraction, by estimation, is 60 to 65%. Left ventricular ejection fraction by 2D MOD biplane is 61.6 %. The left ventricle has normal function. The left ventricle has no regional wall motion abnormalities. The left ventricular internal cavity size was normal in size. There is mild left ventricular hypertrophy. Left ventricular diastolic function could not be evaluated due to atrial fibrillation. Left ventricular diastolic function could not be evaluated. Right Ventricle: The right ventricular size is normal. No increase in right ventricular wall thickness. Right ventricular systolic function is normal. There is mildly elevated pulmonary artery systolic pressure. The tricuspid regurgitant velocity is 2.66  m/s, and with an assumed right atrial pressure of 8 mmHg, the estimated right  ventricular systolic pressure is 36.3 mmHg. Left Atrium: Left atrial size was moderately dilated. Right Atrium: Right atrial size was normal in size. Pericardium: There is no evidence of pericardial effusion. Mitral Valve: The mitral valve is grossly normal. Mild mitral valve regurgitation. Tricuspid Valve: The tricuspid valve is grossly normal. Tricuspid valve regurgitation is mild. Aortic Valve: The aortic valve is tricuspid. Aortic valve regurgitation is trivial. Aortic regurgitation PHT measures 518 msec. Aortic valve sclerosis is present, with no evidence of aortic valve stenosis. Aortic valve peak gradient measures 6.1 mmHg. Pulmonic Valve: The pulmonic valve was grossly normal. Pulmonic valve regurgitation is trivial. Aorta: The aortic root and ascending aorta are structurally normal, with no evidence of dilitation. Venous: The inferior vena cava is dilated in size with greater than 50% respiratory variability, suggesting right atrial pressure of 8 mmHg. IAS/Shunts: There is right bowing of the  interatrial septum, suggestive of elevated left atrial pressure. No atrial level shunt detected by color flow Doppler.  LEFT VENTRICLE PLAX 2D                        Biplane EF (MOD) LVIDd:         4.10 cm         LV B

## 2022-03-02 NOTE — Clinical Social Work Note (Signed)
?  Transition of Care (TOC) Screening Note ? ? ?Patient Details  ?Name: [name] [name] ?Date of Birth: 10/08/1951 ? ? ?Transition of Care (TOC) CM/SW Contact:    ?Ida Rogue, LCSW ?Phone Number: ?03/02/2022, 4:15 PM ? ? ? ?Transition of Care Department [facility]) has reviewed patient and no TOC needs have been identified at this time. We will continue to monitor patient advancement through interdisciplinary progression rounds. If new patient transition needs arise, please place a TOC consult. ? ? ?

## 2022-03-02 NOTE — Progress Notes (Signed)
? ?Progress Note ? ?Patient Name: EMALYN BOGACZ ?Date of Encounter: 03/02/2022 ? ?Primary Cardiologist:   Pixie Casino, MD ? ? ?Subjective  ? ?She feels OK.  She is off the IV Cardizem ? ?Inpatient Medications  ?  ?Scheduled Meds: ? apixaban  5 mg Oral BID  ? diltiazem  60 mg Oral Q8H  ? ?Continuous Infusions: ? ?PRN Meds: ?acetaminophen **OR** acetaminophen, levalbuterol  ? ?Vital Signs  ?  ?Vitals:  ? 03/01/22 1736 03/01/22 2149 03/02/22 0500 03/02/22 [id number]  ?BP: 97/79 102/74  105/67  ?Pulse: 81 78  94  ?Resp: 18 19  17   ?Temp:  97.6 ?F (36.4 ?C)  97.7 ?F (36.5 ?C)  ?TempSrc:      ?SpO2: 92% 97%  95%  ?Weight:   100.9 kg   ?Height:      ? ? ?Intake/Output Summary (Last 24 hours) at 03/02/2022 1246 ?Last data filed at 03/01/2022 2200 ?Gross per 24 hour  ?Intake 286.3 ml  ?Output --  ?Net 286.3 ml  ? ?Filed Weights  ? 02/28/22 1902 03/01/22 0500 03/02/22 0500  ?Weight: 102.1 kg 101.9 kg 100.9 kg  ? ? ?Telemetry  ?  ?Atrial fib with rate about 100 - Personally Reviewed ? ?ECG  ?  ?NA - Personally Reviewed ? ?Physical Exam  ? ?GEN: No acute distress.   ?Neck: No  JVD ?Cardiac: Irregular RR, no murmurs, rubs, or gallops.  ?Respiratory: Clear  to auscultation bilaterally. ?GI: Soft, nontender, non-distended  ?MS: No  edema; No deformity. ?Neuro:  Nonfocal  ?Psych: Normal affect  ? ?Labs  ?  ?Chemistry ?Recent Labs  ?Lab 02/28/22 ?1925 03/01/22 ?[id number] 03/02/22 ?0355  ?NA 138 140 140  ?K 3.6 3.2* 4.1  ?CL 103 106 107  ?CO2 25 27 26   ?GLUCOSE 114* 114* 105*  ?BUN 18 19 16   ?CREATININE 0.97 0.90 0.91  ?CALCIUM 9.6 8.6* 8.7*  ?PROT  --  6.4*  --   ?ALBUMIN  --  3.6  --   ?AST  --  14*  --   ?ALT  --  14  --   ?ALKPHOS  --  40  --   ?BILITOT  --  0.8  --   ?GFRNONAA >60 >60 >60  ?ANIONGAP 10 7 7   ?  ? ?Hematology ?Recent Labs  ?Lab 02/28/22 ?1925 03/01/22 ?0204  ?WBC 7.0 6.0  ?RBC 4.06 3.69*  ?HGB 13.3 12.5  ?HCT 39.3 36.9  ?MCV 96.8 100.0  ?MCH 32.8 33.9  ?MCHC 33.8 33.9  ?RDW 12.5 12.6  ?PLT 186 149*  ? ? ?Cardiac EnzymesNo  results for input(s): TROPONINI in the last 168 hours. No results for input(s): TROPIPOC in the last 168 hours.  ? ?BNP ?Recent Labs  ?Lab 03/01/22 ?0204  ?BNP 202.6*  ?  ? ?DDimer No results for input(s): DDIMER in the last 168 hours.  ? ?Radiology  ?  ?DG Chest Port 1 View ? ?Result Date: 03/01/2022 ?CLINICAL DATA:  Atrial fibrillation EXAM: PORTABLE CHEST 1 VIEW COMPARISON:  02/11/2016 FINDINGS: Heart is normal size. Scarring at the left base. Right lung clear. No effusions or acute bony abnormality. IMPRESSION: Left base scarring.  No active disease. Electronically Signed   By: Rolm Baptise M.D.   On: 03/01/2022 01:48  ? ?ECHOCARDIOGRAM COMPLETE ? ?Result Date: 03/01/2022 ?   ECHOCARDIOGRAM REPORT   Patient Name:   AVEY SCOLARO Date of Exam: 03/01/2022 Medical Rec #:  [id number]    Height:  64.0 in Accession #:    [id number]   Weight:       224.6 lb Date of Birth:  [birth year]    BSA:          2.055 m? Patient Age:    71 years     BP:           117/90 mmHg Patient Gender: F            HR:           66 bpm. Exam Location:  Inpatient Procedure: 2D Echo, Cardiac Doppler and Color Doppler Indications:    Afib  History:        Patient has no prior history of Echocardiogram examinations.                 CHF, Arrythmias:Atrial Fibrillation; Risk Factors:Hypertension.  Sonographer:    Jyl Heinz Referring Phys: [id number] [place]  1. Left ventricular ejection fraction, by estimation, is 60 to 65%. Left ventricular ejection fraction by 2D MOD biplane is 61.6 %. The left ventricle has normal function. The left ventricle has no regional wall motion abnormalities. There is mild left ventricular hypertrophy. Left ventricular diastolic function could not be evaluated.  2. Right ventricular systolic function is normal. The right ventricular size is normal. There is mildly elevated pulmonary artery systolic pressure. The estimated right ventricular systolic pressure is Q000111Q mmHg.  3. Left atrial size was  moderately dilated.  4. The mitral valve is grossly normal. Mild mitral valve regurgitation.  5. The aortic valve is tricuspid. Aortic valve regurgitation is trivial. Aortic valve sclerosis is present, with no evidence of aortic valve stenosis. Aortic regurgitation PHT measures 518 msec.  6. The inferior vena cava is dilated in size with >50% respiratory variability, suggesting right atrial pressure of 8 mmHg. Comparison(s): No prior Echocardiogram. FINDINGS  Left Ventricle: Left ventricular ejection fraction, by estimation, is 60 to 65%. Left ventricular ejection fraction by 2D MOD biplane is 61.6 %. The left ventricle has normal function. The left ventricle has no regional wall motion abnormalities. The left ventricular internal cavity size was normal in size. There is mild left ventricular hypertrophy. Left ventricular diastolic function could not be evaluated due to atrial fibrillation. Left ventricular diastolic function could not be evaluated. Right Ventricle: The right ventricular size is normal. No increase in right ventricular wall thickness. Right ventricular systolic function is normal. There is mildly elevated pulmonary artery systolic pressure. The tricuspid regurgitant velocity is 2.66  m/s, and with an assumed right atrial pressure of 8 mmHg, the estimated right ventricular systolic pressure is Q000111Q mmHg. Left Atrium: Left atrial size was moderately dilated. Right Atrium: Right atrial size was normal in size. Pericardium: There is no evidence of pericardial effusion. Mitral Valve: The mitral valve is grossly normal. Mild mitral valve regurgitation. Tricuspid Valve: The tricuspid valve is grossly normal. Tricuspid valve regurgitation is mild. Aortic Valve: The aortic valve is tricuspid. Aortic valve regurgitation is trivial. Aortic regurgitation PHT measures 518 msec. Aortic valve sclerosis is present, with no evidence of aortic valve stenosis. Aortic valve peak gradient measures 6.1 mmHg. Pulmonic  Valve: The pulmonic valve was grossly normal. Pulmonic valve regurgitation is trivial. Aorta: The aortic root and ascending aorta are structurally normal, with no evidence of dilitation. Venous: The inferior vena cava is dilated in size with greater than 50% respiratory variability, suggesting right atrial pressure of 8 mmHg. IAS/Shunts: There is right bowing of the interatrial septum, suggestive of elevated  left atrial pressure. No atrial level shunt detected by color flow Doppler.  LEFT VENTRICLE PLAX 2D                        Biplane EF (MOD) LVIDd:         4.10 cm         LV Biplane EF:   Left LVIDs:         2.60 cm                          ventricular LV PW:         1.00 cm                          ejection LV IVS:        1.30 cm                          fraction by LVOT diam:     2.20 cm                          2D MOD LV SV:         71                               biplane is LV SV Index:   35                               61.6 %. LVOT Area:     3.80 cm?                                Diastology                                LV e' medial:    8.27 cm/s LV Volumes (MOD)               LV E/e' medial:  15.7 LV vol d, MOD    97.6 ml       LV e' lateral:   8.59 cm/s A2C:                           LV E/e' lateral: 15.1 LV vol d, MOD    84.3 ml A4C: LV vol s, MOD    37.8 ml A2C: LV vol s, MOD    32.4 ml A4C: LV SV MOD A2C:   59.8 ml LV SV MOD A4C:   84.3 ml LV SV MOD BP:    56.7 ml RIGHT VENTRICLE             IVC RV Basal diam:  3.50 cm     IVC diam: 2.40 cm RV Mid diam:    2.00 cm RV S prime:     12.20 cm/s TAPSE (M-mode): 1.9 cm LEFT ATRIUM              Index        RIGHT ATRIUM           Index LA diam:  4.80 cm  2.34 cm/m?   RA Area:     12.40 cm? LA Vol (A2C):   67.6 ml  32.89 ml/m?  RA Volume:   21.90 ml  10.65 ml/m? LA Vol (A4C):   101.0 ml 49.14 ml/m? LA Biplane Vol: 84.6 ml  41.16 ml/m?  AORTIC VALVE AV Area (Vmax): 2.81 cm? AV Vmax:        123.00 cm/s AV Peak Grad:   6.1 mmHg LVOT Vmax:      90.80  cm/s LVOT Vmean:     66.200 cm/s LVOT VTI:       0.188 m AI PHT:         518 msec  AORTA Ao Root diam: 3.30 cm Ao Asc diam:  3.40 cm MITRAL VALVE                TRICUSPID VALVE MV Area (PHT): 3.81 cm?     TR Peak gra

## 2022-03-11 ENCOUNTER — Emergency Department (HOSPITAL_BASED_OUTPATIENT_CLINIC_OR_DEPARTMENT_OTHER): Payer: No Typology Code available for payment source

## 2022-03-11 ENCOUNTER — Encounter (HOSPITAL_BASED_OUTPATIENT_CLINIC_OR_DEPARTMENT_OTHER): Payer: Self-pay | Admitting: Emergency Medicine

## 2022-03-11 ENCOUNTER — Other Ambulatory Visit: Payer: Self-pay

## 2022-03-11 ENCOUNTER — Inpatient Hospital Stay (HOSPITAL_BASED_OUTPATIENT_CLINIC_OR_DEPARTMENT_OTHER)
Admission: EM | Admit: 2022-03-11 | Discharge: 2022-03-16 | DRG: 308 | Disposition: A | Discharge status: Home / Self Care | Payer: No Typology Code available for payment source | Attending: Internal Medicine | Admitting: Internal Medicine

## 2022-03-11 DIAGNOSIS — I480 Paroxysmal atrial fibrillation: Secondary | ICD-10-CM

## 2022-03-11 DIAGNOSIS — F419 Anxiety disorder, unspecified: Secondary | ICD-10-CM | POA: Diagnosis present

## 2022-03-11 DIAGNOSIS — I509 Heart failure, unspecified: Secondary | ICD-10-CM | POA: Diagnosis not present

## 2022-03-11 DIAGNOSIS — J4520 Mild intermittent asthma, uncomplicated: Secondary | ICD-10-CM | POA: Diagnosis present

## 2022-03-11 DIAGNOSIS — Z825 Family history of asthma and other chronic lower respiratory diseases: Secondary | ICD-10-CM | POA: Diagnosis not present

## 2022-03-11 DIAGNOSIS — Z20822 Contact with and (suspected) exposure to covid-19: Secondary | ICD-10-CM | POA: Diagnosis present

## 2022-03-11 DIAGNOSIS — I110 Hypertensive heart disease with heart failure: Secondary | ICD-10-CM | POA: Diagnosis present

## 2022-03-11 DIAGNOSIS — Z8249 Family history of ischemic heart disease and other diseases of the circulatory system: Secondary | ICD-10-CM

## 2022-03-11 DIAGNOSIS — I10 Essential (primary) hypertension: Secondary | ICD-10-CM | POA: Diagnosis not present

## 2022-03-11 DIAGNOSIS — Z6838 Body mass index (BMI) 38.0-38.9, adult: Secondary | ICD-10-CM | POA: Diagnosis not present

## 2022-03-11 DIAGNOSIS — Z7901 Long term (current) use of anticoagulants: Secondary | ICD-10-CM

## 2022-03-11 DIAGNOSIS — I4819 Other persistent atrial fibrillation: Principal | ICD-10-CM | POA: Diagnosis present

## 2022-03-11 DIAGNOSIS — M1990 Unspecified osteoarthritis, unspecified site: Secondary | ICD-10-CM | POA: Diagnosis not present

## 2022-03-11 DIAGNOSIS — I4891 Unspecified atrial fibrillation: Secondary | ICD-10-CM | POA: Diagnosis present

## 2022-03-11 DIAGNOSIS — I5043 Acute on chronic combined systolic (congestive) and diastolic (congestive) heart failure: Secondary | ICD-10-CM | POA: Diagnosis not present

## 2022-03-11 DIAGNOSIS — Z79899 Other long term (current) drug therapy: Secondary | ICD-10-CM | POA: Diagnosis not present

## 2022-03-11 DIAGNOSIS — E876 Hypokalemia: Secondary | ICD-10-CM | POA: Diagnosis not present

## 2022-03-11 DIAGNOSIS — I5033 Acute on chronic diastolic (congestive) heart failure: Secondary | ICD-10-CM | POA: Diagnosis present

## 2022-03-11 DIAGNOSIS — E669 Obesity, unspecified: Secondary | ICD-10-CM | POA: Diagnosis present

## 2022-03-11 DIAGNOSIS — R0602 Shortness of breath: Principal | ICD-10-CM

## 2022-03-11 LAB — COMPREHENSIVE METABOLIC PANEL
ALT: 65 U/L — ABNORMAL HIGH (ref 0–44)
AST: 32 U/L (ref 15–41)
Albumin: 4 g/dL (ref 3.5–5.0)
Alkaline Phosphatase: 44 U/L (ref 38–126)
Anion gap: 8 (ref 5–15)
BUN: 10 mg/dL (ref 8–23)
CO2: 27 mmol/L (ref 22–32)
Calcium: 9 mg/dL (ref 8.9–10.3)
Chloride: 106 mmol/L (ref 98–111)
Creatinine, Ser: 0.83 mg/dL (ref 0.44–1.00)
GFR, Estimated: >60 mL/min (ref >60)
Glucose, Bld: 94 mg/dL (ref 70–99)
Potassium: 4.2 mmol/L (ref 3.5–5.1)
Sodium: 141 mmol/L (ref 135–145)
Total Bilirubin: 0.7 mg/dL (ref 0.3–1.2)
Total Protein: 6.8 g/dL (ref 6.5–8.1)

## 2022-03-11 LAB — BRAIN NATRIURETIC PEPTIDE: B Natriuretic Peptide: 297.3 pg/mL — ABNORMAL HIGH (ref 0.0–100.0)

## 2022-03-11 LAB — CBC WITH DIFFERENTIAL/PLATELET
Abs Immature Granulocytes: 0.02 10*3/uL (ref 0.00–0.07)
Basophils Absolute: 0 10*3/uL (ref 0.0–0.1)
Basophils Relative: 1 %
Eosinophils Absolute: 0.1 10*3/uL (ref 0.0–0.5)
Eosinophils Relative: 1 %
HCT: 38.5 % (ref 36.0–46.0)
Hemoglobin: 12.8 g/dL (ref 12.0–15.0)
Immature Granulocytes: 0 %
Lymphocytes Relative: 30 %
Lymphs Abs: 1.7 10*3/uL (ref 0.7–4.0)
MCH: 33.2 pg (ref 26.0–34.0)
MCHC: 33.2 g/dL (ref 30.0–36.0)
MCV: 100 fL (ref 80.0–100.0)
Monocytes Absolute: 0.6 10*3/uL (ref 0.1–1.0)
Monocytes Relative: 10 %
Neutro Abs: 3.3 10*3/uL (ref 1.7–7.7)
Neutrophils Relative %: 58 %
Platelets: 164 10*3/uL (ref 150–400)
RBC: 3.85 MIL/uL — ABNORMAL LOW (ref 3.87–5.11)
RDW: 12.9 % (ref 11.5–15.5)
WBC: 5.8 10*3/uL (ref 4.0–10.5)
nRBC: 0 % (ref 0.0–0.2)

## 2022-03-11 LAB — RESP PANEL BY RT-PCR (FLU A&B, COVID) ARPGX2
Influenza A by PCR: NEGATIVE
Influenza B by PCR: NEGATIVE
SARS Coronavirus 2 by RT PCR: NEGATIVE

## 2022-03-11 LAB — TROPONIN I (HIGH SENSITIVITY)
Troponin I (High Sensitivity): 3 ng/L (ref <18)
Troponin I (High Sensitivity): 3 ng/L (ref <18)

## 2022-03-11 LAB — MAGNESIUM: Magnesium: 1.9 mg/dL (ref 1.7–2.4)

## 2022-03-11 MED ORDER — VITAMIN D 25 MCG (1000 UNIT) PO TABS
2000.0000 [IU] | ORAL_TABLET | Freq: Every day | ORAL | Status: DC
  Administered 2022-03-12 – 2022-03-16 (×5): 2000 [IU] via ORAL
  Filled 2022-03-11 (×6): qty 2

## 2022-03-11 MED ORDER — ACETAMINOPHEN 500 MG PO TABS
1000.0000 mg | ORAL_TABLET | Freq: Four times a day (QID) | ORAL | Status: DC | PRN
  Administered 2022-03-12 – 2022-03-13 (×3): 1000 mg via ORAL
  Filled 2022-03-11 (×3): qty 2

## 2022-03-11 MED ORDER — METOPROLOL TARTRATE 5 MG/5ML IV SOLN
2.5000 mg | INTRAVENOUS | Status: AC
  Administered 2022-03-12: 2.5 mg via INTRAVENOUS
  Filled 2022-03-11 (×2): qty 5

## 2022-03-11 MED ORDER — METOPROLOL TARTRATE 25 MG PO TABS
25.0000 mg | ORAL_TABLET | Freq: Two times a day (BID) | ORAL | Status: DC
  Administered 2022-03-11 – 2022-03-16 (×11): 25 mg via ORAL
  Filled 2022-03-11 (×10): qty 1

## 2022-03-11 MED ORDER — DILTIAZEM HCL 25 MG/5ML IV SOLN
15.0000 mg | Freq: Once | INTRAVENOUS | Status: AC
  Administered 2022-03-11: 15 mg via INTRAVENOUS
  Filled 2022-03-11: qty 5

## 2022-03-11 MED ORDER — APIXABAN 5 MG PO TABS
5.0000 mg | ORAL_TABLET | Freq: Two times a day (BID) | ORAL | Status: DC
  Administered 2022-03-11 – 2022-03-16 (×10): 5 mg via ORAL
  Filled 2022-03-11 (×10): qty 1

## 2022-03-11 MED ORDER — FUROSEMIDE 10 MG/ML IJ SOLN
40.0000 mg | Freq: Every day | INTRAMUSCULAR | Status: DC
Start: 2022-03-12 — End: 2022-03-13
  Administered 2022-03-12 – 2022-03-13 (×2): 40 mg via INTRAVENOUS
  Filled 2022-03-11 (×2): qty 4

## 2022-03-11 MED ORDER — FUROSEMIDE 10 MG/ML IJ SOLN
40.0000 mg | Freq: Once | INTRAMUSCULAR | Status: AC
  Administered 2022-03-11: 40 mg via INTRAVENOUS
  Filled 2022-03-11: qty 4

## 2022-03-11 MED ORDER — BUSPIRONE HCL 5 MG PO TABS
5.0000 mg | ORAL_TABLET | Freq: Two times a day (BID) | ORAL | Status: DC
  Administered 2022-03-11 – 2022-03-16 (×10): 5 mg via ORAL
  Filled 2022-03-11 (×10): qty 1

## 2022-03-11 MED ORDER — ADULT MULTIVITAMIN W/MINERALS CH
1.0000 | ORAL_TABLET | Freq: Every day | ORAL | Status: DC
  Administered 2022-03-12 – 2022-03-16 (×4): 1 via ORAL
  Filled 2022-03-11 (×5): qty 1

## 2022-03-11 NOTE — H&P (Signed)
?History and Physical  ? ? ?[name] [name] ZOX:[id number] DOB: [birth year] DOA: 03/11/2022 ? ?PCP: Mirna MiresHill, Gerald, MD  ?Patient coming from: [facility] ? ?I have personally briefly reviewed patient's old medical records in [facility] ? ?Chief Complaint: sob ? ?HPI: [name] LimaRuth W [name] is a 71 y.o. female with medical history significant for paroxysmal atrial fibrillation chronically anticoagulated on Eliquis, chronic diastolic heart failure, essential hypertension, mild intermittent asthma,who has interim history of admission  ( 4/5-03/02/2022) with diagnosis of Afib with RVR s/p incidental finding on routine vitals at dental office.Patient on that admission started on Cardizem drip. Rate control was achieved and patient was discharged on increase CCB to 180 mg  up from 120mg . Patient now returns to [facility] with complaints of increase SOB x 2-3 days that has been progressive and is worse with exertion and improves with rest. Patient states she currently feels improved , she states after getting iv medications and urinating she noted improvement in her breathing.  ON ros she also endorsed increase swelling in her lower extremities and orthopnea.She however denies fever/chills/ cough /dysuria/n/v/d or chest pain. ? ? ?ED Course:  ?ON evaluation in ED patient was found to be in Afib with rvr  with CXR suggestive of vascular congestion. Patient was treated with cardizem bolus iv and lasix iv with improvement in HR and sob but due not complete resolution [facility] monitoring and observation.  ? ?Vitals: ?Afeb, bp 131/96, hr 128 sat 975 on ra  ?EkG: afib with rvr  low voltage.  No acute st -twave changes ? ?Labs: ?Bnp 297/202 ?Wbc 5.8, hgb 12.8,plt 164, plt 164 ?Na141, k 4.2, cr 0.83,  ?Cxr ?IMPRESSION: ?Cardiomegaly with mild pulmonary vascular congestion. ? ?Tx diltiazem 15mg  iv x 1, lasix 40mg  iv x 1 ?Ce 3 ?Respiratory panel: ?neg ?Review of Systems: As per HPI otherwise 10 point review of  systems negative.  ? ?Past Medical History:  ?Diagnosis Date  ? A-fib (HCC)   ? Anemia   ? during pneumonia  ? Arthritis   ? Asthma   ? Hypertension   ? ? ?Past Surgical History:  ?Procedure Laterality Date  ? c section    ? 1973 and 1983  ? COLONOSCOPY    ? ORIF ANKLE FRACTURE Left 08/16/2016  ? ORIF ANKLE FRACTURE Left 08/16/2016  ? Procedure: OPEN REDUCTION INTERNAL FIXATION (ORIF) LEFT BIMALLEOLAR ANKLE FRACTURE;  Surgeon: Kathryne Hitchhristopher Y Blackman, MD;  Location: [facility] OR;  Service: Orthopedics;  Laterality: Left;  ? TUBAL LIGATION    ? ? ? reports that she has never smoked. She has never used smokeless tobacco. She reports that she does not drink alcohol and does not use drugs. ? ?No Known Allergies ? ?Family History  ?Problem Relation Age of Onset  ? Atrial fibrillation Mother   ? Hypertension Mother   ? Asthma Father   ? Heart attack Father   ? ? ?Prior to Admission medications   ?Medication Sig Start Date End Date Taking? Authorizing Provider  ?acetaminophen (TYLENOL) 500 MG tablet Take 1,000 mg by mouth every 6 (six) hours as needed for mild pain.   Yes [provider]  ?apixaban (ELIQUIS) 5 MG TABS tablet Take 1 tablet by mouth twice daily ?Patient taking differently: Take 5 mg by mouth 2 (two) times daily. 10/23/21  Yes Hilty, Lisette AbuKenneth C, MD  ?busPIRone (BUSPAR) 5 MG tablet Take 5 mg by mouth 2 (two) times daily.    Yes [provider]  ?cholecalciferol (VITAMIN D) 1000 units tablet Take 2,000 Units by mouth daily.   Yes [provider]  ?diltiazem (CARDIZEM CD) 180 MG 24 hr capsule Take 1 capsule (180 mg total) by mouth daily. 03/03/22  Yes Meredeth Ide, MD  ?Multiple Vitamin (MULTIVITAMIN WITH MINERALS) TABS tablet Take 1 tablet by mouth daily.   Yes [provider]  ? ? ?Physical Exam: ?Vitals:  ? 03/11/22 1615 03/11/22 1900 03/11/22 2010 03/11/22 2100  ?BP: 101/81 114/86  (!) 119/91  ?Pulse: 91  (!) 127 (!) 113  ?Resp: (!) 21 19 15  (!) 24  ?Temp:      ?SpO2: 96% 96% 97% 97%   ?Weight:      ?Height:      ? ? ? ?Vitals:  ? 03/11/22 1615 03/11/22 1900 03/11/22 2010 03/11/22 2100  ?BP: 101/81 114/86  (!) 119/91  ?Pulse: 91  (!) 127 (!) 113  ?Resp: (!) 21 19 15  (!) 24  ?Temp:      ?SpO2: 96% 96% 97% 97%  ?Weight:      ?Height:      ?Constitutional: NAD, calm, comfortable ?Eyes: PERRL, lids and conjunctivae normal ?ENMT: Mucous membranes are moist. Posterior pharynx clear of any exudate or lesions.Normal dentition.  ?Neck: normal, supple, no masses, no thyromegaly ?Respiratory: clear to auscultation bilaterally, no wheezing, no crackles. Normal respiratory effort. No accessory muscle use.  ?Cardiovascular: irRegular rate and rhythm, no murmurs / rubs / gallops. Trace -+1 edema. 2+ pedal pulses. No carotid bruits.  ?Abdomen: no tenderness, no masses palpated. No hepatosplenomegaly. Bowel sounds positive.  ?Musculoskeletal: no clubbing / cyanosis. No joint deformity upper and lower extremities. Good ROM, no contractures. Normal muscle tone.  ?Skin: no rashes, lesions, ulcers. No induration ?Neurologic: CN 2-12 grossly intact. Sensation intact, . Strength 5/5 in all 4.  ?Psychiatric: Normal judgment and insight. Alert and oriented x 3. Normal mood.  ? ? ?Labs on Admission: I have personally reviewed following labs and imaging studies ? ?CBC: ?Recent Labs  ?Lab 03/11/22 ?1508  ?WBC 5.8  ?NEUTROABS 3.3  ?HGB 12.8  ?HCT 38.5  ?MCV 100.0  ?PLT 164  ? ?Basic Metabolic Panel: ?Recent Labs  ?Lab 03/11/22 ?1508  ?NA 141  ?K 4.2  ?CL 106  ?CO2 27  ?GLUCOSE 94  ?BUN 10  ?CREATININE 0.83  ?CALCIUM 9.0  ?MG 1.9  ? ?GFR: ?Estimated Creatinine Clearance: 72.9 mL/min (by C-G formula based on SCr of 0.83 mg/dL). ?Liver Function Tests: ?Recent Labs  ?Lab 03/11/22 ?1508  ?AST 32  ?ALT 65*  ?ALKPHOS 44  ?BILITOT 0.7  ?PROT 6.8  ?ALBUMIN 4.0  ? ?No results for input(s): LIPASE, AMYLASE in the last 168 hours. ?No results for input(s): AMMONIA in the last 168 hours. ?Coagulation Profile: ?No results for input(s):  INR, PROTIME in the last 168 hours. ?Cardiac Enzymes: ?No results for input(s): CKTOTAL, CKMB, CKMBINDEX, TROPONINI in the last 168 hours. ?BNP (last 3 results) ?No results for input(s): PROBNP in the last 8760 hours. ?HbA1C: ?No results for input(s): HGBA1C in the last 72 hours. ?CBG: ?No results for input(s): GLUCAP in the last 168 hours. ?Lipid Profile: ?No results for input(s): CHOL, HDL, LDLCALC, TRIG, CHOLHDL, LDLDIRECT in the last 72 hours. ?Thyroid Function Tests: ?No results for input(s): TSH, T4TOTAL, FREET4, T3FREE, THYROIDAB in the last 72 hours. ?Anemia Panel: ?No results for input(s): VITAMINB12, FOLATE, FERRITIN, TIBC, IRON, RETICCTPCT in the last 72 hours. ?Urine analysis: ?   ?Component Value Date/Time  ?  COLORURINE YELLOW 03/01/2022 0653  ? APPEARANCEUR CLEAR 03/01/2022 0653  ? LABSPEC 1.019 03/01/2022 0653  ? PHURINE 6.0 03/01/2022 0653  ? GLUCOSEU NEGATIVE 03/01/2022 0653  ? HGBUR NEGATIVE 03/01/2022 0653  ? BILIRUBINUR NEGATIVE 03/01/2022 0653  ? KETONESUR NEGATIVE 03/01/2022 0653  ? PROTEINUR NEGATIVE 03/01/2022 0653  ? NITRITE NEGATIVE 03/01/2022 0653  ? LEUKOCYTESUR NEGATIVE 03/01/2022 0653  ? ? ?Radiological Exams on Admission: ?DG Chest Portable 1 View ? ?Result Date: 03/11/2022 ?CLINICAL DATA:  Shortness of breath EXAM: PORTABLE CHEST 1 VIEW COMPARISON:  Chest x-ray 03/01/2022 FINDINGS: Cardiomegaly and mediastinum appear unchanged. Mildly increased pulmonary vascular prominence since previous study. No focal consolidation identified. No large pleural effusion. No pneumothorax. IMPRESSION: Cardiomegaly with mild pulmonary vascular congestion. Electronically Signed   By: Jannifer Hick M.D.   On: 03/11/2022 15:45   ? ?EKG: Independently reviewed. See above ? ?Assessment/Plan ? ?P Afib with RVR  ?-start metoprolol bid transition to XL as able ?-prn iv metoprolol for hr consistently above 120  ?-cardiology consult in am to assist with medication regimen  ?-continue on  anticoagulation with  Eliquis ? ?Acute on Chronic diastolic heart failure ?-in setting of afib with rvr  ?-cxr with congestion  ?-lasix 40 iv daily , prn dosing as needed  ? ?Essential hypertension ?-stable  ? ? ?Mild intermit

## 2022-03-11 NOTE — ED Triage Notes (Signed)
Pt c/o shortness of breath with exertion onset yesterday. Pt has increased shortness of breath with talking. Pt denies chest pain. ?

## 2022-03-11 NOTE — ED Notes (Addendum)
Report given to [facility]. 

## 2022-03-11 NOTE — ED Notes (Signed)
Report given to the floor RN.

## 2022-03-11 NOTE — ED Provider Notes (Signed)
?[facility] EM[facility] DEPT ?Provider Note ? ? ?CSN: [id number] ?Arrival date & time: 03/11/22  1307 ? ?  ? ?History ? ?Chief Complaint  ?Patient presents with  ? Shortness of Breath  ? ? ?[name] [name] is a 71 y.o. female.  Presents to ER with concern for shortness of breath.  Has been having some shortness of breath over the last few days, seems to be significantly worse than yesterday.  Breathing typically gets worse with exertion.  Improves with rest.  Currently feels mildly short of breath at rest.  No associated chest pain.  She denies any episodes of passing out or feeling lightheaded.  She endorses compliance with her medications including anticoagulation.  Denies any recent missed doses. ? ?Per review of chart, recent admission for A-fib with RVR, required diltiazem infusion.  She was increased to 180 mg of Cardizem CD.  Patient endorses compliance with this new dose. ? ?Additional history obtained from patient's daughter at bedside, she is Charity fundraiserN at [employer].  She states that patient has not previously had issues of shortness of breath with past issues with A-fib. ? ?HPI ? ?  ? ?Home Medications ?Prior to Admission medications   ?Medication Sig Start Date End Date Taking? Authorizing Provider  ?acetaminophen (TYLENOL) 500 MG tablet Take 1,000 mg by mouth every 6 (six) hours as needed for mild pain.    [provider]  ?apixaban (ELIQUIS) 5 MG TABS tablet Take 1 tablet by mouth twice daily ?Patient taking differently: Take 5 mg by mouth 2 (two) times daily. 10/23/21   Hilty, Lisette AbuKenneth C, MD  ?busPIRone (BUSPAR) 5 MG tablet Take 5 mg by mouth 2 (two) times daily.     [provider]  ?cholecalciferol (VITAMIN D) 1000 units tablet Take 2,000 Units by mouth daily.    [provider]  ?diltiazem (CARDIZEM CD) 180 MG 24 hr capsule Take 1 capsule (180 mg total) by mouth daily. 03/03/22   Meredeth IdeLama, Gagan S, MD  ?Multiple Vitamin (MULTIVITAMIN WITH MINERALS) TABS tablet Take 1 tablet by  mouth daily.    [provider]  ?   ? ?Allergies    ?Patient has no known allergies.   ? ?Review of Systems   ?Review of Systems  ?Constitutional:  Positive for fatigue. Negative for chills and fever.  ?HENT:  Negative for ear pain and sore throat.   ?Eyes:  Negative for pain and visual disturbance.  ?Respiratory:  Positive for shortness of breath. Negative for cough.   ?Cardiovascular:  Negative for chest pain and palpitations.  ?Gastrointestinal:  Negative for abdominal pain and vomiting.  ?Genitourinary:  Negative for dysuria and hematuria.  ?Musculoskeletal:  Negative for arthralgias and back pain.  ?Skin:  Negative for color change and rash.  ?Neurological:  Negative for seizures and syncope.  ?All other systems reviewed and are negative. ? ?Physical Exam ?Updated Vital Signs ?BP 101/81   Pulse 91   Temp 98.2 ?F (36.8 ?C)   Resp (!) 21   Ht 5\' 4"  (1.626 m)   Wt 100.9 kg   SpO2 96%   BMI 38.18 kg/m?  ?Physical Exam ?Vitals and nursing note reviewed.  ?Constitutional:   ?   General: She is not in acute distress. ?   Appearance: She is well-developed.  ?HENT:  ?   Head: Normocephalic and atraumatic.  ?Eyes:  ?   Conjunctiva/sclera: Conjunctivae normal.  ?Cardiovascular:  ?   Rate and Rhythm: Tachycardia present. Rhythm irregular.  ?   Heart  sounds: No murmur heard. ?Pulmonary:  ?   Effort: Pulmonary effort is normal.  ?   Comments: No wheezing on exam, slight tachypnea at rest ?Abdominal:  ?   Palpations: Abdomen is soft.  ?   Tenderness: There is no abdominal tenderness.  ?Musculoskeletal:     ?   General: No swelling.  ?   Cervical back: Neck supple.  ?Skin: ?   General: Skin is warm and dry.  ?   Capillary Refill: Capillary refill takes less than 2 seconds.  ?Neurological:  ?   Mental Status: She is alert.  ?Psychiatric:     ?   Mood and Affect: Mood normal.  ? ? ?ED Results / Procedures / Treatments   ?Labs ?(all labs ordered are listed, but only abnormal results are displayed) ?Labs Reviewed   ?CBC WITH DIFFERENTIAL/PLATELET - Abnormal; Notable for the following components:  ?    Result Value  ? RBC 3.85 (*)   ? All other components within normal limits  ?COMPREHENSIVE METABOLIC PANEL - Abnormal; Notable for the following components:  ? ALT 65 (*)   ? All other components within normal limits  ?BRAIN NATRIURETIC PEPTIDE - Abnormal; Notable for the following components:  ? B Natriuretic Peptide 297.3 (*)   ? All other components within normal limits  ?RESP PANEL BY RT-PCR (FLU A&B, COVID) [id number]  ?MAGNESIUM  ?TROPONIN I (HIGH SENSITIVITY)  ?TROPONIN I (HIGH SENSITIVITY)  ? ? ?EKG ?EKG Interpretation ? ?Date/Time:  Sunday March 11 2022 13:24:08 EDT ?Ventricular Rate:  141 ?PR Interval:    ?QRS Duration: 76 ?QT Interval:  324 ?QTC Calculation: 496 ?R Axis:   37 ?Text Interpretation: afib with RVR Low voltage QRS Cannot rule out Inferior infarct , age undetermined Cannot rule out Anterior infarct , age undetermined Abnormal ECG When compared with ECG of 01-Mar-2022 03:51, Sinus rhythm has replaced Atrial fibrillation Vent. rate has increased BY  63 BPM when compared to prior, appears to show aflutter with RVR. No STEMI Confirmed by Theda Belfast ([id number]) on 03/11/2022 1:26:46 PM ? ?Radiology ?DG Chest Portable 1 View ? ?Result Date: 03/11/2022 ?CLINICAL DATA:  Shortness of breath EXAM: PORTABLE CHEST 1 VIEW COMPARISON:  Chest x-ray 03/01/2022 FINDINGS: Cardiomegaly and mediastinum appear unchanged. Mildly increased pulmonary vascular prominence since previous study. No focal consolidation identified. No large pleural effusion. No pneumothorax. IMPRESSION: Cardiomegaly with mild pulmonary vascular congestion. Electronically Signed   By: Jannifer Hick M.D.   On: 03/11/2022 15:45   ? ?Procedures ?Procedures  ? ? ?Medications Ordered in ED ?Medications  ?diltiazem (CARDIZEM) injection 15 mg (15 mg Intravenous Given 03/11/22 1615)  ?furosemide (LASIX) injection 40 mg (40 mg Intravenous Given 03/11/22 1659)   ? ? ?ED Course/ Medical Decision Making/ A&P ?  ?                        ?Medical Decision Making ?Amount and/or Complexity of Data Reviewed ?Labs: ordered. ?Radiology: ordered. ? ?Risk ?Prescription drug management. ?Decision regarding hospitalization. ? ? ?71 year old lady presented to the emergency room with concern for dyspnea on exertion.  Physical exam she appears well in no distress, vitals noted initially for tachycardia.  EKG demonstrating A-fib with RVR.  BP stable.  CXR reviewed by myself.  Radiologist report some vascular congestion, mild cardiomegaly but no frank edema.  Labs reviewed, no elevation in troponin, no ischemic change on EKG, doubt ACS.  She does have some elevation in BNP, more than on last  admission.  Given the patient does not have any hypoxia and she is compliant with her 10 a inhibitor and denies any missed doses, have low suspicion for acute PE at this time.  More likely concern for possible heart failure, fluid overload state contributing to symptoms.  Treated her heart rate with dose of IV Cardizem.  Provided dose of IV Lasix due to concern for possible fluid overload state.  Her heart rate has improved and she has started having good urine output however she is still complaining of dyspnea on exertion.  Given her persistent symptoms, feel she would benefit from admission for observation and further testing.  I discussed the case with Dr. Nelson Chimes with the hospitalist service who agrees to admit patient. ? ? ?Per review of chart, recent admission for A-fib with RVR, required diltiazem infusion.  She was increased to 180 mg of Cardizem CD.  Patient endorses compliance with this new dose. ? ?Additional history obtained from patient's daughter at bedside, she is Charity fundraiser at [employer].  She states that patient has not previously had issues of shortness of breath with past issues with A-fib. ? ? ? ? ? ? ? ?Final Clinical Impression(s) / ED Diagnoses ?Final diagnoses:  ?Short of breath on exertion   ?Atrial fibrillation, unspecified type (HCC)  ? ? ?Rx / DC Orders ?ED Discharge Orders   ? ? None  ? ?  ? ? ?  ?Milagros Loll, MD ?03/11/22 1810 ? ?

## 2022-03-11 NOTE — Progress Notes (Signed)
71 year old with history of paroxysmal atrial fibrillation on Eliquis, diastolic CHF EF 65%, HTN, intermittent asthma recently discharged from the hospital on/7/23 after being treated for atrial fibrillation with RVR and CHF presents back to droppage ED again with progressive shortness of breath over the last few days.  Patient again ?Mild atrial fibrillation with RVR with some evidence of fluid overload. ? ?In the ED patient received dose of IV Lasix and IV Cardizem as well.  Although she is not requiring any oxygen at the moment she is dyspneic on exertion.  She admits of compliance with her Eliquis therefore per ED provider low suspicion for PE at this time. ?Chest x-ray does show some pulmonary vascular congestion. ? ?Patient will be admitted to telemetry at [facility]. ? ?Stephania Fragmin MD ?[facility] ?

## 2022-03-12 DIAGNOSIS — I5043 Acute on chronic combined systolic (congestive) and diastolic (congestive) heart failure: Secondary | ICD-10-CM | POA: Diagnosis not present

## 2022-03-12 DIAGNOSIS — I5033 Acute on chronic diastolic (congestive) heart failure: Secondary | ICD-10-CM | POA: Diagnosis not present

## 2022-03-12 DIAGNOSIS — I10 Essential (primary) hypertension: Secondary | ICD-10-CM

## 2022-03-12 DIAGNOSIS — I4891 Unspecified atrial fibrillation: Secondary | ICD-10-CM | POA: Diagnosis not present

## 2022-03-12 LAB — URINALYSIS, ROUTINE W REFLEX MICROSCOPIC
Bilirubin Urine: NEGATIVE
Glucose, UA: NEGATIVE mg/dL
Ketones, ur: NEGATIVE mg/dL
Nitrite: NEGATIVE
Protein, ur: NEGATIVE mg/dL
Specific Gravity, Urine: 1.017 (ref 1.005–1.030)
pH: 5 (ref 5.0–8.0)

## 2022-03-12 LAB — COMPREHENSIVE METABOLIC PANEL
ALT: 62 U/L — ABNORMAL HIGH (ref 0–44)
AST: 30 U/L (ref 15–41)
Albumin: 3.8 g/dL (ref 3.5–5.0)
Alkaline Phosphatase: 46 U/L (ref 38–126)
Anion gap: 10 (ref 5–15)
BUN: 13 mg/dL (ref 8–23)
CO2: 27 mmol/L (ref 22–32)
Calcium: 8.6 mg/dL — ABNORMAL LOW (ref 8.9–10.3)
Chloride: 105 mmol/L (ref 98–111)
Creatinine, Ser: 0.93 mg/dL (ref 0.44–1.00)
GFR, Estimated: >60 mL/min (ref >60)
Glucose, Bld: 149 mg/dL — ABNORMAL HIGH (ref 70–99)
Potassium: 3.4 mmol/L — ABNORMAL LOW (ref 3.5–5.1)
Sodium: 142 mmol/L (ref 135–145)
Total Bilirubin: 0.5 mg/dL (ref 0.3–1.2)
Total Protein: 6.6 g/dL (ref 6.5–8.1)

## 2022-03-12 LAB — BASIC METABOLIC PANEL
Anion gap: 6 (ref 5–15)
BUN: 13 mg/dL (ref 8–23)
CO2: 29 mmol/L (ref 22–32)
Calcium: 8.4 mg/dL — ABNORMAL LOW (ref 8.9–10.3)
Chloride: 107 mmol/L (ref 98–111)
Creatinine, Ser: 0.91 mg/dL (ref 0.44–1.00)
GFR, Estimated: >60 mL/min (ref >60)
Glucose, Bld: 110 mg/dL — ABNORMAL HIGH (ref 70–99)
Potassium: 3.6 mmol/L (ref 3.5–5.1)
Sodium: 142 mmol/L (ref 135–145)

## 2022-03-12 LAB — CBC
HCT: 37.2 % (ref 36.0–46.0)
Hemoglobin: 12.5 g/dL (ref 12.0–15.0)
MCH: 34.2 pg — ABNORMAL HIGH (ref 26.0–34.0)
MCHC: 33.6 g/dL (ref 30.0–36.0)
MCV: 101.6 fL — ABNORMAL HIGH (ref 80.0–100.0)
Platelets: 179 10*3/uL (ref 150–400)
RBC: 3.66 MIL/uL — ABNORMAL LOW (ref 3.87–5.11)
RDW: 12.9 % (ref 11.5–15.5)
WBC: 7 10*3/uL (ref 4.0–10.5)
nRBC: 0 % (ref 0.0–0.2)

## 2022-03-12 LAB — TSH: TSH: 3.685 u[IU]/mL (ref 0.350–4.500)

## 2022-03-12 LAB — TROPONIN I (HIGH SENSITIVITY)
Troponin I (High Sensitivity): 3 ng/L (ref <18)
Troponin I (High Sensitivity): 3 ng/L (ref <18)

## 2022-03-12 LAB — MAGNESIUM: Magnesium: 2 mg/dL (ref 1.7–2.4)

## 2022-03-12 MED ORDER — METOPROLOL TARTRATE 5 MG/5ML IV SOLN
2.5000 mg | Freq: Once | INTRAVENOUS | Status: AC
  Administered 2022-03-13: 2.5 mg via INTRAVENOUS
  Filled 2022-03-12: qty 5

## 2022-03-12 MED ORDER — POTASSIUM CHLORIDE CRYS ER 20 MEQ PO TBCR
40.0000 meq | EXTENDED_RELEASE_TABLET | Freq: Once | ORAL | Status: AC
  Administered 2022-03-12: 40 meq via ORAL
  Filled 2022-03-12: qty 2

## 2022-03-12 MED ORDER — LACTATED RINGERS IV BOLUS
250.0000 mL | Freq: Once | INTRAVENOUS | Status: AC
  Administered 2022-03-13: 250 mL via INTRAVENOUS

## 2022-03-12 NOTE — Progress Notes (Signed)
13 Pt called RN to room c/o SOB when laying in bed and CP, mid sternum area, 4/10 pain. Pt was medicated with Tylenol around 1530 for same CP. Pt stated it started when she got done walking with PT around the unit at 1530 but has gotten increasingly worse. RN checked EKG recording and noted patient's HR bouncing from 130s-160s. Pt c/o SOB when HR>150. RN paged Dr. [place] Crane and Dr. Fransico Him from cardiology. New orders received to give pt additional dose of lopressor now and DR. Turner stated she would increase lopressor to TID. Patient asked to restart "home heart med" so RN asked MD about diltiazem, but was not restarted by MD at this time. Pt's dgt at bed side. VSS. Will give dose of lopressor per order and increase monitoring.  ? ?1800 Pt stated she felt about the same, HR still bouncing from 130s-150s, WCTM.  ? ?[id number] Pt stated about the same, dgt at bedside.  ? ?1850 Pt stated her SOB comes and goes, HR still elevated, WCTM.  ?

## 2022-03-12 NOTE — Progress Notes (Signed)
with complaints of chest pains 7/10 dull pain. HR is also running high for a little while now. On afib 120s-130s. Gave her her due dose of metroprolol P.O. and day shift gave her metroprolol orals and iv as well. wECG done. Referred to J. Garner Nash NP and started oxygen at 2 liters per minute. Will closely observe the patient.  ?

## 2022-03-12 NOTE — Progress Notes (Signed)
? ?PROGRESS NOTE ? ?KAMILLIA SELF [id number] DOB: Apr 07, 1951 DOA: 03/11/2022 ?PCP: Iona Beard, MD ? ?HPI/Recap of past 24 hours: ? [name] [name] is a 71 y.o. female with medical history significant for paroxysmal atrial fibrillation chronically anticoagulated on Eliquis, chronic diastolic heart failure, essential hypertension, mild intermittent asthma,who has interim history of admission  ( 4/5-03/02/2022) with diagnosis of Afib with RVR s/p incidental finding on routine vitals at dental office.Patient on that admission started on Cardizem drip. Rate control was achieved and patient was discharged on increase CCB to 180 mg  up from 120mg . [facility] now [facility] with complaints of increase SOB x 2-3 days that has been progressive and is worse with exertion and improves with rest. Patient states she currently feels improved , she states after getting iv medications and urinating she noted improvement in her breathing.  ON ros she also endorsed increase swelling in her lower extremities and orthopnea.She however denies fever/chills/ cough /dysuria/n/v/d or chest pain. ? ?03/12/2022: Patient was seen and examined at bedside.  In A-fib this morning.  With ongoing diuresing.  Seen by cardiology.  Appreciate assistance. ? ?Assessment/Plan: ?Principal Problem: ?  Acute exacerbation of CHF (congestive heart failure) ([place]) ?Active Problems: ?  A-fib [facility]) ? ?P Afib with RVR, persistent ?-start metoprolol bid transition to XL as able ?-prn iv metoprolol for hr consistently above 120  ?Management per cardiology ?-continue on  anticoagulation with Eliquis ?  ?Acute on Chronic diastolic heart failure ?-in setting of afib with rvr  ?-cxr with congestion  ?-lasix 40 iv daily, prn dosing as needed  ?BNP elevated on admission greater than 600 ?Ongoing diuresing ?Start strict I's and O's and daily weight ?Cardiology following ?  ?Essential hypertension ?BP is stable ?Continue to monitor vital signs ?   ?Mild intermittent  asthma ?-no wheezing on exam  ?-continue with home controller medicaiton  ?-prn xopenex  ? ?  ?DVT prophylaxis: on Eliquis  ?  ?Code Status: Full ?Family Communication: Daughter at bedside ?Disposition Plan: Likely will discharge to home once cardiology signs off. ?Consults called: Cardiology ?Admission status: progressive  ? ? ? ?Status is: Inpatient ?Patient requires at least 2 midnights for further evaluation and treatment of present condition. ? ? ? ?Objective: ?Vitals:  ? 03/12/22 [id number] 03/12/22 1105 03/12/22 1108 03/12/22 1444  ?BP: (!) 111/93 (!) 114/97  128/79  ?Pulse: (!) 101 89 83 (!) 49  ?Resp: 18 (!) 22  18  ?Temp: 98.2 ?F (36.8 ?C) 98 ?F (36.7 ?C)  99.1 ?F (37.3 ?C)  ?TempSrc: Oral Oral  Oral  ?SpO2: 94% 97% 98% 97%  ?Weight:      ?Height:      ? ? ?Intake/Output Summary (Last 24 hours) at 03/12/2022 1711 ?Last data filed at 03/12/2022 1239 ?Gross per 24 hour  ?Intake 1040 ml  ?Output 850 ml  ?Net 190 ml  ? ?Filed Weights  ? 03/11/22 1320 03/11/22 2324 03/12/22 0500  ?Weight: 100.9 kg 100.9 kg 102.4 kg  ? ? ?Exam: ? ?General: 71 y.o. year-old female well developed well nourished in no acute distress.  Alert and oriented x3. ?Cardiovascular: Regular rate and rhythm with no rubs or gallops.  No thyromegaly or JVD noted.   ?Respiratory: Clear to auscultation with no wheezes or rales. Good inspiratory effort. ?Abdomen: Soft nontender nondistended with normal bowel sounds x4 quadrants. ?Musculoskeletal: Trace lower extremity edema. 2/4 pulses in all 4 extremities. ?Skin: No ulcerative lesions noted or rashes, ?Psychiatry: Mood is appropriate for  condition and setting ? ? ?Data Reviewed: ?CBC: ?Recent Labs  ?Lab 03/11/22 ?1508 03/12/22 ?0109  ?WBC 5.8 7.0  ?NEUTROABS 3.3  --   ?HGB 12.8 12.5  ?HCT 38.5 37.2  ?MCV 100.0 101.6*  ?PLT 164 179  ? ?Basic Metabolic Panel: ?Recent Labs  ?Lab 03/11/22 ?1508 03/11/22 ?2311 03/12/22 ?0109  ?NA 141 142 142  ?K 4.2 3.4* 3.6  ?CL 106 105 107  ?CO2 27 27 29   ?GLUCOSE 94 149*  110*  ?BUN 10 13 13   ?CREATININE 0.83 0.93 0.91  ?CALCIUM 9.0 8.6* 8.4*  ?MG 1.9 2.0  --   ? ?GFR: ?Estimated Creatinine Clearance: 67 mL/min (by C-G formula based on SCr of 0.91 mg/dL). ?Liver Function Tests: ?Recent Labs  ?Lab 03/11/22 ?1508 03/11/22 ?2311  ?AST 32 30  ?ALT 65* 62*  ?ALKPHOS 44 46  ?BILITOT 0.7 0.5  ?PROT 6.8 6.6  ?ALBUMIN 4.0 3.8  ? ?No results for input(s): LIPASE, AMYLASE in the last 168 hours. ?No results for input(s): AMMONIA in the last 168 hours. ?Coagulation Profile: ?No results for input(s): INR, PROTIME in the last 168 hours. ?Cardiac Enzymes: ?No results for input(s): CKTOTAL, CKMB, CKMBINDEX, TROPONINI in the last 168 hours. ?BNP (last 3 results) ?No results for input(s): PROBNP in the last 8760 hours. ?HbA1C: ?No results for input(s): HGBA1C in the last 72 hours. ?CBG: ?No results for input(s): GLUCAP in the last 168 hours. ?Lipid Profile: ?No results for input(s): CHOL, HDL, LDLCALC, TRIG, CHOLHDL, LDLDIRECT in the last 72 hours. ?Thyroid Function Tests: ?Recent Labs  ?  03/11/22 ?2311  ?TSH 3.685  ? ?Anemia Panel: ?No results for input(s): VITAMINB12, FOLATE, FERRITIN, TIBC, IRON, RETICCTPCT in the last 72 hours. ?Urine analysis: ?   ?Component Value Date/Time  ? COLORURINE YELLOW 03/12/2022 0011  ? APPEARANCEUR HAZY (A) 03/12/2022 0011  ? LABSPEC 1.017 03/12/2022 0011  ? PHURINE 5.0 03/12/2022 0011  ? GLUCOSEU NEGATIVE 03/12/2022 0011  ? HGBUR MODERATE (A) 03/12/2022 0011  ? [place] NEGATIVE 03/12/2022 0011  ? [place] NEGATIVE 03/12/2022 0011  ? PROTEINUR NEGATIVE 03/12/2022 0011  ? NITRITE NEGATIVE 03/12/2022 0011  ? LEUKOCYTESUR TRACE (A) 03/12/2022 0011  ? ?Sepsis Labs: ?@LABRCNTIP (procalcitonin:4,lacticidven:4) ? ?) ?Recent Results (from the past 240 hour(s))  ?Resp Panel by RT-PCR (Flu A&B, Covid) Nasopharyngeal Swab     Status: None  ? Collection Time: 03/11/22  5:26 PM  ? Specimen: Nasopharyngeal Swab; Nasopharyngeal(NP) swabs in vial transport medium  ?Result Value  Ref Range Status  ? SARS Coronavirus 2 by RT PCR NEGATIVE NEGATIVE Final  ?  Comment: (NOTE) ?SARS-CoV-2 target nucleic acids are NOT DETECTED. ? ?The SARS-CoV-2 RNA is generally detectable in upper respiratory ?specimens during the acute phase of infection. The lowest ?concentration of SARS-CoV-2 viral copies this assay can detect is ?138 copies/mL. A negative result does not preclude SARS-Cov-2 ?infection and should not be used as the sole basis for treatment or ?other patient management decisions. A negative result may occur with  ?improper specimen collection/handling, submission of specimen other ?than nasopharyngeal swab, presence of viral mutation(s) within the ?areas targeted by this assay, and inadequate number of viral ?copies(<138 copies/mL). A negative result must be combined with ?clinical observations, patient history, and epidemiological ?information. The expected result is Negative. ? ?Fact Sheet for Patients:  ?EntrepreneurPulse.com.au ? ?Fact Sheet for Healthcare Providers:  ?IncredibleEmployment.be ? ?This test is no t yet approved or cleared by the [place] FDA and  ?has been authorized for detection and/or diagnosis of SARS-CoV-2 by ?  FDA under an Emergency Use Authorization (EUA). This EUA will remain  ?in effect (meaning this test can be used) for the duration of the ?COVID-19 declaration under Section 564(b)(1) of the Act, 21 ?U.S.C.section 360bbb-3(b)(1), unless the authorization is terminated  ?or revoked sooner.  ? ? ?  ? Influenza A by PCR NEGATIVE NEGATIVE Final  ? Influenza B by PCR NEGATIVE NEGATIVE Final  ?  Comment: (NOTE) ?The Xpert Xpress SARS-CoV-2/FLU/RSV plus assay is intended as an aid ?in the diagnosis of influenza from Nasopharyngeal swab specimens and ?should not be used as a sole basis for treatment. Nasal washings and ?aspirates are unacceptable for Xpert Xpress SARS-CoV-2/FLU/RSV ?testing. ? ?Fact Sheet for  Patients: ?EntrepreneurPulse.com.au ? ?Fact Sheet for Healthcare Providers: ?IncredibleEmployment.be ? ?This test is not yet approved or cleared by the [place] and ?has been authorized

## 2022-03-12 NOTE — Consult Note (Signed)
?Cardiology Consultation:  ? ?Patient ID: [name] [name] ?MRN: [id number]; DOB: December 12, 1950 ? ?Admit date: 03/11/2022 ?Date of Consult: 03/12/2022 ? ?PCP:  Mirna Mires, MD ?  ?[facility] Providers ?Cardiologist:  Chrystie Nose, MD      ? ? ?Patient Profile:  ? ?[name] [name] is a [name], chronic anticoagulation, [name], HTN, asthma,  who is being seen 03/12/2022 for the evaluation of SOB at the request of Dr. Margo Aye. ? ?History of Present Illness:  ? ?Ms. Kregel has Afib first diagnosed in 2017. She is maintained on cardizem and anticoagulated with eliquis. She was found to have RVR incidentally on routine vitals at the dentist's office. She was hospitalized 4/5-03/02/2022 and treated for Afib with RVR. Echocardiogram with LVEF 60-65%, no RWMA, mild LVH, normal RV fx, moderately dilated left atrium, and mild MR. She was treated with increased home cardizem and eliquis and instructed to follow-up with Dr. Rennis Golden to discuss whether to proceed with outpatient cardioversion.. ? ?She returned to [facility]  03/11/22 with shortness of breath. She was again in Afib RVR with CXR suggestive of vascular congestion. She has been treated with IV cardizem. Cardiology consulted.  She tells me that she was doing well for a week after discharge and then started developing shortness of breath along with some lower extremity edema and increased abdominal fullness.   BNP was elevated to 97 and at bedtime troponin was negative at 3> 3>3.  Chest x-ray showed cardiomegaly with mild pulmonary vascular congestion.  She was given a dose of 40 mg IV Lasix last night and again this morning at 11 AM.  She was given Lopressor 2.5 mg IV for heart rate control and continued on Lopressor 25 mg twice daily.  She also received 15 IV Cardizem in the ER. ? ?This morning she is sitting up in a chair reading feeling better.  Breathing is better.  She has put out 850 cc but is still net +190 cc since admission. ? ? ?Past Medical History:   ?Diagnosis Date  ? A-fib ([facility])   ? Anemia   ? during pneumonia  ? Arthritis   ? Asthma   ? Hypertension   ? ? ?Past Surgical History:  ?Procedure Laterality Date  ? c section    ? 1973 and 1983  ? COLONOSCOPY    ? ORIF ANKLE FRACTURE Left 08/16/2016  ? ORIF ANKLE FRACTURE Left 08/16/2016  ? Procedure: OPEN REDUCTION INTERNAL FIXATION (ORIF) LEFT BIMALLEOLAR ANKLE FRACTURE;  Surgeon: Kathryne Hitch, MD;  Location: [facility] OR;  Service: Orthopedics;  Laterality: Left;  ? TUBAL LIGATION    ?  ? ?Home Medications:  ?Prior to Admission medications   ?Medication Sig Start Date End Date Taking? Authorizing Provider  ?acetaminophen (TYLENOL) 500 MG tablet Take 1,000 mg by mouth every 6 (six) hours as needed for mild pain.   Yes [provider]  ?apixaban (ELIQUIS) 5 MG TABS tablet Take 1 tablet by mouth twice daily ?Patient taking differently: Take 5 mg by mouth 2 (two) times daily. 10/23/21  Yes Hilty, Lisette Abu, MD  ?busPIRone (BUSPAR) 5 MG tablet Take 5 mg by mouth 2 (two) times daily.    Yes [provider]  ?cholecalciferol (VITAMIN D) 1000 units tablet Take 2,000 Units by mouth daily.   Yes [provider]  ?diltiazem (CARDIZEM CD) 180 MG 24 hr capsule Take 1 capsule (180 mg total) by mouth daily. 03/03/22  Yes Meredeth Ide, MD  ?  Multiple Vitamin (MULTIVITAMIN WITH MINERALS) TABS tablet Take 1 tablet by mouth daily.   Yes [provider]  ? ? ?Inpatient Medications: ?Scheduled Meds: ? apixaban  5 mg Oral BID  ? busPIRone  5 mg Oral BID  ? cholecalciferol  2,000 Units Oral Daily  ? furosemide  40 mg Intravenous Daily  ? metoprolol tartrate  25 mg Oral BID  ? multivitamin with minerals  1 tablet Oral Daily  ? potassium chloride  40 mEq Oral Once  ? ?Continuous Infusions: ? ?PRN Meds: ?acetaminophen ? ?Allergies:   No Known Allergies ? ?Social History:   ?Social History  ? ?Socioeconomic History  ? Marital status: Single  ?  Spouse name: Not on file  ? Number of children: Not on file   ? Years of education: Not on file  ? Highest education level: Not on file  ?Occupational History  ? Not on file  ?Tobacco Use  ? Smoking status: Never  ? Smokeless tobacco: Never  ?Vaping Use  ? Vaping Use: Never used  ?Substance and Sexual Activity  ? Alcohol use: No  ? Drug use: No  ? Sexual activity: Not Currently  ?Other Topics Concern  ? Not on file  ?Social History Narrative  ? Not on file  ? ?Social Determinants of Health  ? ?Financial Resource Strain: Not on file  ?Food Insecurity: Not on file  ?Transportation Needs: Not on file  ?Physical Activity: Not on file  ?Stress: Not on file  ?Social Connections: Not on file  ?Intimate Partner Violence: Not on file  ?  ?Family History:   ? ?Family History  ?Problem Relation Age of Onset  ? Atrial fibrillation Mother   ? Hypertension Mother   ? Asthma Father   ? Heart attack Father   ?  ? ?ROS:  ?Please see the history of present illness.  ? ?All other ROS reviewed and negative.    ? ?Physical Exam/Data:  ? ?Vitals:  ? 03/12/22 0500 03/12/22 0832 03/12/22 1105 03/12/22 1108  ?BP:  (!) 111/93 (!) 114/97   ?Pulse:  (!) 101 89 83  ?Resp:  18 (!) 22   ?Temp:  98.2 ?F (36.8 ?C) 98 ?F (36.7 ?C)   ?TempSrc:  Oral Oral   ?SpO2:  94% 97% 98%  ?Weight: 102.4 kg     ?Height:      ? ? ?Intake/Output Summary (Last 24 hours) at 03/12/2022 1410 ?Last data filed at 03/12/2022 1239 ?Gross per 24 hour  ?Intake 1040 ml  ?Output 850 ml  ?Net 190 ml  ? ? ?  03/12/2022  ?  5:00 AM 03/11/2022  ? 11:24 PM 03/11/2022  ?  1:20 PM  ?Last 3 Weights  ?Weight (lbs) 225 lb 12.8 oz 222 lb 7.1 oz 222 lb 7.1 oz  ?Weight (kg) 102.422 kg 100.9 kg 100.9 kg  ?   ?Body mass index is 38.76 kg/m?.  ?General:  Well nourished, well developed, in no acute distress ?HEENT: normal ?Neck: no JVD ?Vascular: No carotid bruits; Distal pulses 2+ bilaterally ?Cardiac:  normal S1, S2; irregularly irregular; no murmur  ?Lungs:  clear to auscultation bilaterally, no wheezing, rhonchi or rales  ?Abd: soft, nontender, no  hepatomegaly  ?Ext: Trace lower extremity edema ?Musculoskeletal:  No deformities, BUE and BLE strength normal and equal ?Skin: warm and dry  ?Neuro:  CNs 2-12 intact, no focal abnormalities noted ?Psych:  Normal affect  ? ?EKG:  The EKG was personally reviewed and demonstrates: Atrial fibrillation with RVR  at 118 bpm with low voltage QRS in the limb leads ?Telemetry:  Telemetry was personally reviewed and demonstrates: Atrial fibrillation with controlled ventricular response ? ?Relevant CV Studies: ? ?Echo 03/01/22 ? 1. Left ventricular ejection fraction, by estimation, is 60 to 65%. Left  ?ventricular ejection fraction by 2D MOD biplane is 61.6 %. The left  ?ventricle has normal function. The left ventricle has no regional wall  ?motion abnormalities. There is mild left  ?ventricular hypertrophy. Left ventricular diastolic function could not be  ?evaluated.  ? 2. Right ventricular systolic function is normal. The right ventricular  ?size is normal. There is mildly elevated pulmonary artery systolic  ?pressure. The estimated right ventricular systolic pressure is 36.3 mmHg.  ? 3. Left atrial size was moderately dilated.  ? 4. The mitral valve is grossly normal. Mild mitral valve regurgitation.  ? 5. The aortic valve is tricuspid. Aortic valve regurgitation is trivial.  ?Aortic valve sclerosis is present, with no evidence of aortic valve  ?stenosis. Aortic regurgitation PHT measures 518 msec.  ? 6. The inferior vena cava is dilated in size with >50% respiratory  ?variability, suggesting right atrial pressure of 8 mmHg. ? ?Laboratory Data: ? ?High Sensitivity Troponin:   ?Recent Labs  ?Lab 02/28/22 ?2148 03/11/22 ?1508 03/11/22 ?1708 03/11/22 ?2311 03/12/22 ?0109  ?TROPONINIHS 3 3 3 3 3   ?   ?Chemistry ?Recent Labs  ?Lab 03/11/22 ?1508 03/11/22 ?2311 03/12/22 ?0109  ?NA 141 142 142  ?K 4.2 3.4* 3.6  ?CL 106 105 107  ?CO2 27 27 29   ?GLUCOSE 94 149* 110*  ?BUN 10 13 13   ?CREATININE 0.83 0.93 0.91  ?CALCIUM 9.0 8.6* 8.4*   ?MG 1.9 2.0  --   ?GFRNONAA >60 >60 >60  ?ANIONGAP 8 10 6   ?  ?Recent Labs  ?Lab 03/11/22 ?1508 03/11/22 ?2311  ?PROT 6.8 6.6  ?ALBUMIN 4.0 3.8  ?AST 32 30  ?ALT 65* 62*  ?ALKPHOS 44 46  ?BILITOT 0.7 0.5  ? ?Dierdre SearlesLi

## 2022-03-12 NOTE — Progress Notes (Signed)
?   03/12/22 1731  ?Assess: MEWS Score  ?Temp 98.4 ?F (36.9 ?C)  ?BP (!) 115/92  ?Pulse Rate (!) 149  ?ECG Heart Rate (!) 143  ?Resp (!) 24  ?SpO2 96 %  ?O2 Device Room Air  ?Assess: MEWS Score  ?MEWS Temp 0  ?MEWS Systolic 0  ?MEWS Pulse 3  ?MEWS RR 1  ?MEWS LOC 0  ?MEWS Score 4  ?MEWS Score Color Red  ?Assess: if the MEWS score is Yellow or Red  ?Were vital signs taken at a resting state? Yes  ?Focused Assessment Change from prior assessment (see assessment flowsheet)  ?Does the patient meet 2 or more of the SIRS criteria? No  ?MEWS guidelines implemented *See Row Information* Yes  ?Treat  ?MEWS Interventions Other (Comment) ?(called cards for orders)  ?Pain Scale 0-10  ?Pain Score 4  ?Pain Type Acute pain  ?Pain Location Chest  ?Pain Orientation Mid;Upper  ?Pain Descriptors / Indicators Discomfort  ?Pain Frequency Intermittent  ?Pain Onset On-going  ?Patients Stated Pain Goal 0  ?Pain Intervention(s) Repositioned;MD notified (Comment) ?(Dr. Fransico Him, Dr. Nevada Crane)  ?Multiple Pain Sites No  ?Take Vital Signs  ?Increase Vital Sign Frequency  Red: Q 1hr X 4 then Q 4hr X 4, if remains red, continue Q 4hrs  ?Escalate  ?MEWS: Escalate Red: discuss with charge nurse/RN and provider, consider discussing with RRT  ?Notify: Charge Nurse/RN  ?Name of Charge Nurse/RN Notified Myriam Jacobson  ?Date Charge Nurse/RN Notified 03/12/22  ?Time Charge Nurse/RN Notified 1740  ?Notify: Provider  ?Provider Name/Title Dr. Fransico Him  ?Date Provider Notified 03/12/22  ?Time Provider Notified 1730  ?Notification Type Page  ?Notification Reason Change in status  ?Provider response See new orders  ?Date of Provider Response 03/12/22  ?Time of Provider Response 1734  ?Notify: Rapid Response  ?Name of Rapid Response RN Notified Polly Cobia  ?Date Rapid Response Notified 03/12/22  ?Time Rapid Response Notified 1745  ?Document  ?Patient Outcome Stabilized after interventions  ?Progress note created (see row info) Yes  ?Assess: SIRS CRITERIA  ?SIRS  Temperature  0  ?SIRS Pulse 1  ?SIRS Respirations  1  ?SIRS WBC 0  ?SIRS Score Sum  2  ? ? ?

## 2022-03-12 NOTE — Evaluation (Signed)
Physical Therapy Evaluation ?Patient Details ?Name: [name] [name] ?MRN: [id number] ?DOB: [birth year] ?Today's Date: 03/12/2022 ? ?History of Present Illness ? 71 y.o. female with afib with RVR. PMH: PAF, chronic anticoagulation, [name], HTN, asthma, L ankle fx  ?Clinical Impression ? Patient evaluated by Physical Therapy with no further acute PT needs identified. All education has been completed and the patient has no further questions.  ?See below for mobility details. No further  needs  at this time  ? See below for any follow-up Physical Therapy or equipment needs. PT is signing off. Thank you for this referral. ?   ?   ? ?Recommendations for follow up therapy are one component of a multi-disciplinary discharge planning process, led by the attending physician.  Recommendations may be updated based on patient status, additional functional criteria and insurance authorization. ? ?Follow Up Recommendations No PT follow up ? ?  ?Assistance Recommended at Discharge PRN  ?Patient can return home with the following ?   ? ?  ?Equipment Recommendations None recommended by PT  ?Recommendations for Other Services ?    ?  ?Functional Status Assessment Patient has not had a recent decline in their functional status  ? ?  ?Precautions / Restrictions Precautions ?Precaution Comments: monitor HR ?Restrictions ?Weight Bearing Restrictions: No  ? ?  ? ?Mobility ? Bed Mobility ?  ?  ?  ?  ?  ?  ?  ?General bed mobility comments: in recliner ?  ? ?Transfers ?Overall transfer level: Modified independent ?  ?Transfers: Sit to/from Stand ?Sit to Stand: Modified independent (Device/Increase time) ?  ?  ?  ?  ?  ?General transfer comment: use of armrests, incr time, no physical assist ?  ? ?Ambulation/Gait ?Ambulation/Gait assistance: Supervision, Modified independent (Device/Increase time) ?Gait Distance (Feet): 170 Feet ?  ?Gait Pattern/deviations: Step-through pattern, Decreased stance time - left, Decreased stride length ?  ?  ?  ?General  Gait Details: pt noted to demonstrate incr ankle strategy on L during stance. no LOB, guarded initially, improved with distance. no LOB ? ?Stairs ?  ?  ?  ?  ?  ? ?Wheelchair Mobility ?  ? ?Modified Rankin (Stroke Patients Only) ?  ? ?  ? ?Balance Overall balance assessment: Mild deficits observed, not formally tested ?  ?  ?  ?  ?  ?  ?  ?  ?  ?  ?  ?  ?  ?  ?  ?  ?  ?  ?   ? ? ? ?Pertinent Vitals/Pain Pain Assessment ?Pain Assessment: No/denies pain  ? ? ?Home Living Family/patient expects to be discharged to:: Private residence ?Living Arrangements: Alone ?Available Help at Discharge: Friend(s);Available PRN/intermittently ?Type of Home: House ?Home Access: Stairs to enter ?  ?Entrance Stairs-Number of Steps: 5-typically goes up one step at time ?  ?  ?Home Equipment: None ?   ?  ?Prior Function Prior Level of Function : Independent/Modified Independent ?  ?  ?  ?  ?  ?  ?Mobility Comments: pt reports she amb more slowly since ankle fx in 2017 ?  ?  ? ? ?Hand Dominance  ?   ? ?  ?Extremity/Trunk Assessment  ? Upper Extremity Assessment ?Upper Extremity Assessment: Overall WFL for tasks assessed ?  ? ?Lower Extremity Assessment ?Lower Extremity Assessment: Overall WFL for tasks assessed ?  ? ?   ?Communication  ? Communication: No difficulties  ?Cognition Arousal/Alertness: Awake/alert ?Behavior During Therapy: [facility] for tasks assessed/performed ?Overall  Cognitive Status: Within Functional Limits for tasks assessed ?  ?  ?  ?  ?  ?  ?  ?  ?  ?  ?  ?  ?  ?  ?  ?  ?  ?  ?  ? ?  ?General Comments   ? ?  ?Exercises    ? ?Assessment/Plan  ?  ?PT Assessment Patient does not need any further PT services  ?PT Problem List   ? ?   ?  ?PT Treatment Interventions     ? ?PT Goals (Current goals can be found in the Care Plan section)  ?Acute Rehab PT Goals ?Patient Stated Goal: to go home soon ?PT Goal Formulation: All assessment and education complete, DC therapy ? ?  ?Frequency   ?  ? ? ?Co-evaluation   ?  ?  ?  ?  ? ? ?   ?AM-PAC PT "6 Clicks" Mobility  ?Outcome Measure Help needed turning from your back to your side while in a flat bed without using bedrails?: None ?Help needed moving from lying on your back to sitting on the side of a flat bed without using bedrails?: None ?Help needed moving to and from a bed to a chair (including a wheelchair)?: None ?Help needed standing up from a chair using your arms (e.g., wheelchair or bedside chair)?: None ?Help needed to walk in hospital room?: None ?Help needed climbing 3-5 steps with a railing? : A Little ?6 Click Score: 23 ? ?  ?End of Session Equipment Utilized During Treatment: Gait belt ?Activity Tolerance: Patient tolerated treatment well ?Patient left: in chair;with call bell/phone within reach ?  ?PT Visit Diagnosis: Other abnormalities of gait and mobility (R26.89) ?  ? ?Time: [id number] ?PT Time Calculation (min) (ACUTE ONLY): 20 min ? ? ?Charges:   PT Evaluation ?$PT Eval Low Complexity: 1 Low ?  ?  ?   ? ? ?[name] [name], PT ? ?Acute Rehab Dept [facility]) [phone] ?Pager [phone] ? ?03/12/2022 ? ? ?[name] [name] ?03/12/2022, 3:05 PM ? ?

## 2022-03-13 DIAGNOSIS — E876 Hypokalemia: Secondary | ICD-10-CM

## 2022-03-13 DIAGNOSIS — I5033 Acute on chronic diastolic (congestive) heart failure: Secondary | ICD-10-CM | POA: Diagnosis not present

## 2022-03-13 DIAGNOSIS — I4891 Unspecified atrial fibrillation: Secondary | ICD-10-CM | POA: Diagnosis not present

## 2022-03-13 MED ORDER — LEVALBUTEROL HCL 0.63 MG/3ML IN NEBU: 0.6300 mg | INHALATION_SOLUTION | Freq: Four times a day (QID) | RESPIRATORY_TRACT | Status: DC | PRN

## 2022-03-13 MED ORDER — DILTIAZEM LOAD VIA INFUSION
10.0000 mg | Freq: Once | INTRAVENOUS | Status: AC
  Administered 2022-03-13: 10 mg via INTRAVENOUS
  Filled 2022-03-13: qty 10

## 2022-03-13 MED ORDER — LORAZEPAM 2 MG/ML IJ SOLN
0.5000 mg | Freq: Once | INTRAMUSCULAR | Status: AC
  Administered 2022-03-13: 0.5 mg via INTRAVENOUS
  Filled 2022-03-13: qty 1

## 2022-03-13 MED ORDER — DILTIAZEM HCL-DEXTROSE 125-5 MG/125ML-% IV SOLN (PREMIX)
5.0000 mg/h | INTRAVENOUS | Status: DC
  Administered 2022-03-13: 10 mg/h via INTRAVENOUS
  Administered 2022-03-13: 5 mg/h via INTRAVENOUS
  Administered 2022-03-14 – 2022-03-15 (×2): 10 mg/h via INTRAVENOUS
  Filled 2022-03-13 (×5): qty 125

## 2022-03-13 MED ORDER — IPRATROPIUM-ALBUTEROL 0.5-2.5 (3) MG/3ML IN SOLN
3.0000 mL | Freq: Four times a day (QID) | RESPIRATORY_TRACT | Status: DC
Start: 2022-03-13 — End: 2022-03-13
  Filled 2022-03-13 (×2): qty 3

## 2022-03-13 MED ORDER — POTASSIUM CHLORIDE CRYS ER 20 MEQ PO TBCR
20.0000 meq | EXTENDED_RELEASE_TABLET | Freq: Every day | ORAL | Status: DC
  Administered 2022-03-13 – 2022-03-15 (×3): 20 meq via ORAL
  Filled 2022-03-13 (×3): qty 1

## 2022-03-13 MED ORDER — FUROSEMIDE 10 MG/ML IJ SOLN
40.0000 mg | Freq: Two times a day (BID) | INTRAMUSCULAR | Status: DC
  Administered 2022-03-13 – 2022-03-15 (×4): 40 mg via INTRAVENOUS
  Filled 2022-03-13 (×4): qty 4

## 2022-03-13 NOTE — Progress Notes (Signed)
OT Cancellation Note ? ?Patient Details ?Name: [name] [name] ?MRN: [id number] ?DOB: 1951-06-24 ? ? ?Cancelled Treatment:    Reason Eval/Treat Not Completed: Medical issues which prohibited therapy ?Patients HR in bed is 138 to 145 bpm on monitor. OT to continue to follow and hold until patient is more medically stable.  ?Analiyah Lechuga OTR/L, MS ?Acute Rehabilitation Department ?Office# [phone] ?Pager# [phone] ? ? ?03/13/2022, 9:13 AM ?

## 2022-03-13 NOTE — Progress Notes (Signed)
Increased Diltiazem to 10 ml/hr pt, HR decreased to 90- 100 A-fib, resp rate decreased too. VSs stable. Denies pain and discomfort. Will cont to monitor. SRP RN ?

## 2022-03-13 NOTE — Progress Notes (Signed)
? ?PROGRESS NOTE ? ?[name] [name] [id number] DOB: Nov 02, 1951 DOA: 03/11/2022 ?PCP: Iona Beard, MD ? ?HPI/Recap of past 24 hours: ?[name] [name] is a 71 y.o. female with medical history significant for paroxysmal atrial fibrillation chronically anticoagulated on Eliquis, chronic diastolic CHF, essential hypertension, mild intermittent asthma, who has interim history of admission ( 4/5-03/02/2022) with diagnosis of Afib with RVR s/p incidental finding on routine vitals at dental office.  Patient on that admission was started on Cardizem drip.  Rate control was achieved and patient was discharged on increase CCB to 180 mg up from 120mg . [facility] with complaints of increase SOB x 2-3 days that has been progressive and is worse with exertion and improves with rest.  She was found to be in acute on chronic diastolic CHF and in A-fib with RVR.  Cardiology was consulted. ? ?03/13/2022: Patient was seen and examined at bedside.  She reports right-sided chest discomfort.  Twelve-lead EKG was ordered.  She was seen by cardiology. ? ?Assessment/Plan: ?Principal Problem: ?  Acute exacerbation of CHF (congestive heart failure) ([place]) ?Active Problems: ?  A-fib [facility]) ? ?P Afib with RVR, persistent ?Currently on p.o. Lopressor 25 mg twice daily ?She is on Eliquis for CVA prevention ?Cardiology assisting with management. ?  ?Acute on Chronic dCHF ?Ongoing diuresing with IV Lasix 40 mg twice daily. ?Continue strict I's and O's and daily weight ?Continue beta-blocker ? ?Chronic anxiety ?Currently on buspirone ?  ?Essential hypertension ?BP is stable. ?Continue to closely monitor vital signs. ?   ?Mild intermittent asthma ?Continue bronchodilators ? ?  ?DVT prophylaxis: on Eliquis  ?  ?Code Status: Full ?Family Communication: Sister at bedside. ?Disposition Plan: Likely will discharge to home once cardiology signs off. ?Consults called: Cardiology ?Admission status: progressive  ? ? ? ?Status is:  Inpatient ?Patient requires at least 2 midnights for further evaluation and treatment of present condition. ? ? ? ?Objective: ?Vitals:  ? 03/13/22 0736 03/13/22 [id number] 03/13/22 1126 03/13/22 1338  ?BP: (!) 116/91 115/89  115/73  ?Pulse: (!) 128 (!) 142  (!) 108  ?Resp: (!) 24 (!) 28 (!) 35 (!) 24  ?Temp: 98.4 ?F (36.9 ?C)   98.8 ?F (37.1 ?C)  ?TempSrc: Oral   Oral  ?SpO2: 93% 97% 97% 99%  ?Weight:      ?Height:      ? ? ?Intake/Output Summary (Last 24 hours) at 03/13/2022 1721 ?Last data filed at 03/13/2022 1324 ?Gross per 24 hour  ?Intake 900.02 ml  ?Output 300 ml  ?Net 600.02 ml  ? ?Filed Weights  ? 03/11/22 2324 03/12/22 0500 03/13/22 0500  ?Weight: 100.9 kg 102.4 kg 103 kg  ? ? ?Exam: ? ?General: 71 y.o. year-old female well-developed well-nourished in no acute distress.  She is alert and oriented x3.   ?Cardiovascular: Irregular rate and rhythm no rubs or gallops.   ?Respiratory: Clear to auscultation no wheezes no rales.   ?Abdomen: Soft normal bowel sounds present.   ?Musculoskeletal: Trace lower extremity edema bilaterally.   ?Skin: No ulcerative lesions noted. ?Psychiatry: Mood is appropriate for condition. ? ? ?Data Reviewed: ?CBC: ?Recent Labs  ?Lab 03/11/22 ?1508 03/12/22 ?0109  ?WBC 5.8 7.0  ?NEUTROABS 3.3  --   ?HGB 12.8 12.5  ?HCT 38.5 37.2  ?MCV 100.0 101.6*  ?PLT 164 179  ? ?Basic Metabolic Panel: ?Recent Labs  ?Lab 03/11/22 ?1508 03/11/22 ?2311 03/12/22 ?0109  ?NA 141 142 142  ?K 4.2 3.4* 3.6  ?CL 106  105 107  ?CO2 27 27 29   ?GLUCOSE 94 149* 110*  ?BUN 10 13 13   ?CREATININE 0.83 0.93 0.91  ?CALCIUM 9.0 8.6* 8.4*  ?MG 1.9 2.0  --   ? ?GFR: ?Estimated Creatinine Clearance: 67.2 mL/min (by C-G formula based on SCr of 0.91 mg/dL). ?Liver Function Tests: ?Recent Labs  ?Lab 03/11/22 ?1508 03/11/22 ?2311  ?AST 32 30  ?ALT 65* 62*  ?ALKPHOS 44 46  ?BILITOT 0.7 0.5  ?PROT 6.8 6.6  ?ALBUMIN 4.0 3.8  ? ?No results for input(s): LIPASE, AMYLASE in the last 168 hours. ?No results for input(s): AMMONIA in the last  168 hours. ?Coagulation Profile: ?No results for input(s): INR, PROTIME in the last 168 hours. ?Cardiac Enzymes: ?No results for input(s): CKTOTAL, CKMB, CKMBINDEX, TROPONINI in the last 168 hours. ?BNP (last 3 results) ?No results for input(s): PROBNP in the last 8760 hours. ?HbA1C: ?No results for input(s): HGBA1C in the last 72 hours. ?CBG: ?No results for input(s): GLUCAP in the last 168 hours. ?Lipid Profile: ?No results for input(s): CHOL, HDL, LDLCALC, TRIG, CHOLHDL, LDLDIRECT in the last 72 hours. ?Thyroid Function Tests: ?Recent Labs  ?  03/11/22 ?2311  ?TSH 3.685  ? ?Anemia Panel: ?No results for input(s): VITAMINB12, FOLATE, FERRITIN, TIBC, IRON, RETICCTPCT in the last 72 hours. ?Urine analysis: ?   ?Component Value Date/Time  ? COLORURINE YELLOW 03/12/2022 0011  ? APPEARANCEUR HAZY (A) 03/12/2022 0011  ? LABSPEC 1.017 03/12/2022 0011  ? PHURINE 5.0 03/12/2022 0011  ? GLUCOSEU NEGATIVE 03/12/2022 0011  ? HGBUR MODERATE (A) 03/12/2022 0011  ? [place] NEGATIVE 03/12/2022 0011  ? [place] NEGATIVE 03/12/2022 0011  ? PROTEINUR NEGATIVE 03/12/2022 0011  ? NITRITE NEGATIVE 03/12/2022 0011  ? LEUKOCYTESUR TRACE (A) 03/12/2022 0011  ? ?Sepsis Labs: ?@LABRCNTIP (procalcitonin:4,lacticidven:4) ? ?) ?Recent Results (from the past 240 hour(s))  ?Resp Panel by RT-PCR (Flu A&B, Covid) Nasopharyngeal Swab     Status: None  ? Collection Time: 03/11/22  5:26 PM  ? Specimen: Nasopharyngeal Swab; Nasopharyngeal(NP) swabs in vial transport medium  ?Result Value Ref Range Status  ? SARS Coronavirus 2 by RT PCR NEGATIVE NEGATIVE Final  ?  Comment: (NOTE) ?SARS-CoV-2 target nucleic acids are NOT DETECTED. ? ?The SARS-CoV-2 RNA is generally detectable in upper respiratory ?specimens during the acute phase of infection. The lowest ?concentration of SARS-CoV-2 viral copies this assay can detect is ?138 copies/mL. A negative result does not preclude SARS-Cov-2 ?infection and should not be used as the sole basis for treatment  or ?other patient management decisions. A negative result may occur with  ?improper specimen collection/handling, submission of specimen other ?than nasopharyngeal swab, presence of viral mutation(s) within the ?areas targeted by this assay, and inadequate number of viral ?copies(<138 copies/mL). A negative result must be combined with ?clinical observations, patient history, and epidemiological ?information. The expected result is Negative. ? ?Fact Sheet for Patients:  ?EntrepreneurPulse.com.au ? ?Fact Sheet for Healthcare Providers:  ?IncredibleEmployment.be ? ?This test is no t yet approved or cleared by the [place] FDA and  ?has been authorized for detection and/or diagnosis of SARS-CoV-2 by ?FDA under an Emergency Use Authorization (EUA). This EUA will remain  ?in effect (meaning this test can be used) for the duration of the ?COVID-19 declaration under Section 564(b)(1) of the Act, 21 ?U.S.C.section 360bbb-3(b)(1), unless the authorization is terminated  ?or revoked sooner.  ? ? ?  ? Influenza A by PCR NEGATIVE NEGATIVE Final  ? Influenza B by PCR NEGATIVE NEGATIVE Final  ?  Comment: (NOTE) ?The Xpert Xpress SARS-CoV-2/FLU/RSV plus assay is intended as an aid ?in the diagnosis of influenza from Nasopharyngeal swab specimens and ?should not be used as a sole basis for treatment. Nasal washings and ?aspirates are unacceptable for Xpert Xpress SARS-CoV-2/FLU/RSV ?testing. ? ?Fact Sheet for Patients: ?EntrepreneurPulse.com.au ? ?Fact Sheet for Healthcare Providers: ?IncredibleEmployment.be ? ?This test is not yet approved or cleared by the [place] FDA and ?has been authorized for detection and/or diagnosis of SARS-CoV-2 by ?FDA under an Emergency Use Authorization (EUA). This EUA will remain ?in effect (meaning this test can be used) for the duration of the ?COVID-19 declaration under Section 564(b)(1) of the Act, 21 U.S.C. ?section  360bbb-3(b)(1), unless the authorization is terminated or ?revoked. ? ?Performed at [facility], [address] ?[place], [place] [address] ?  ?  ? ? ?Studies: ?No results found. ? ?Scheduled Meds: ? apixa

## 2022-03-13 NOTE — Progress Notes (Signed)
Patient was experiencing too high of a heart rate for a breathing treatment.Nebulizer was held at this time. Physician will be contacted and patient will be reassessed. RT will continue to monitor ?

## 2022-03-13 NOTE — Progress Notes (Addendum)
? ?Progress Note ? ?Patient Name: [name] [name] ?Date of Encounter: 03/13/2022 ? ?[facility] Cardiologist: Chrystie Nose, MD  ? ?Subjective  ? ?Denies any chest pain or SOB.   ? ?Inpatient Medications  ?  ?Scheduled Meds: ? apixaban  5 mg Oral BID  ? busPIRone  5 mg Oral BID  ? cholecalciferol  2,000 Units Oral Daily  ? furosemide  40 mg Intravenous Daily  ? ipratropium-albuterol  3 mL Nebulization Q6H  ? metoprolol tartrate  25 mg Oral BID  ? multivitamin with minerals  1 tablet Oral Daily  ? ?Continuous Infusions: ? ?PRN Meds: ?acetaminophen  ? ?Vital Signs  ?  ?Vitals:  ? 03/13/22 0500 03/13/22 0536 03/13/22 0736 03/13/22 0922  ?BP:  104/72 (!) 116/91 115/89  ?Pulse:  (!) 115 (!) 128 (!) 142  ?Resp:  16 (!) 24 (!) 28  ?Temp:  98.3 ?F (36.8 ?C) 98.4 ?F (36.9 ?C)   ?TempSrc:  Oral Oral   ?SpO2:  98% 93% 97%  ?Weight: 103 kg     ?Height:      ? ? ?Intake/Output Summary (Last 24 hours) at 03/13/2022 1057 ?Last data filed at 03/13/2022 0900 ?Gross per 24 hour  ?Intake 1100 ml  ?Output 1150 ml  ?Net -50 ml  ? ? ?  03/13/2022  ?  5:00 AM 03/12/2022  ?  5:00 AM 03/11/2022  ? 11:24 PM  ?Last 3 Weights  ?Weight (lbs) 227 lb 1.6 oz 225 lb 12.8 oz 222 lb 7.1 oz  ?Weight (kg) 103.012 kg 102.422 kg 100.9 kg  ?   ? ?Telemetry  ?  ?Atrial fibrillation with RVR in the 140's - Personally Reviewed ? ?ECG  ?  ?No new EKG to review - Personally Reviewed ? ?Physical Exam  ? ?GEN: No acute distress.   ?Neck: No JVD ?Cardiac: irregularly irregular and tachy, no murmurs, rubs, or gallops.  ?Respiratory: Clear to auscultation bilaterally. ?GI: Soft, nontender, non-distended  ?MS: No edema; No deformity. ?Neuro:  Nonfocal  ?Psych: Normal affect  ? ?Labs  ?  ?High Sensitivity Troponin:   ?Recent Labs  ?Lab 02/28/22 ?2148 03/11/22 ?1508 03/11/22 ?1708 03/11/22 ?2311 03/12/22 ?0109  ?TROPONINIHS 3 3 3 3 3   ?   ? ?Chemistry ?Recent Labs  ?Lab 03/11/22 ?1508 03/11/22 ?2311 03/12/22 ?0109  ?NA 141 142 142  ?K 4.2 3.4* 3.6  ?CL 106 105 107  ?CO2  27 27 29   ?GLUCOSE 94 149* 110*  ?BUN 10 13 13   ?CREATININE 0.83 0.93 0.91  ?CALCIUM 9.0 8.6* 8.4*  ?PROT 6.8 6.6  --   ?ALBUMIN 4.0 3.8  --   ?AST 32 30  --   ?ALT 65* 62*  --   ?ALKPHOS 44 46  --   ?BILITOT 0.7 0.5  --   ?GFRNONAA >60 >60 >60  ?ANIONGAP 8 10 6   ?  ? ?Hematology ?Recent Labs  ?Lab 03/11/22 ?1508 03/12/22 ?0109  ?WBC 5.8 7.0  ?RBC 3.85* 3.66*  ?HGB 12.8 12.5  ?HCT 38.5 37.2  ?MCV 100.0 101.6*  ?MCH 33.2 34.2*  ?MCHC 33.2 33.6  ?RDW 12.9 12.9  ?PLT 164 179  ? ? ?BNP ?Recent Labs  ?Lab 03/11/22 ?1508  ?BNP 297.3*  ?  ? ?DDimer No results for input(s): DDIMER in the last 168 hours. ? ? ?CHA2DS2-VASc Score = 4  ?This indicates a 4.8% annual risk of stroke. ?The patient's score is based upon: ?CHF History: 1 ?HTN History: 1 ?Diabetes History: 0 ?Stroke History: 0 ?  Vascular Disease History: 0 ?Age Score: 1 ?Gender Score: 1 ? ?Radiology  ?  ?DG Chest Portable 1 View ? ?Result Date: 03/11/2022 ?CLINICAL DATA:  Shortness of breath EXAM: PORTABLE CHEST 1 VIEW COMPARISON:  Chest x-ray 03/01/2022 FINDINGS: Cardiomegaly and mediastinum appear unchanged. Mildly increased pulmonary vascular prominence since previous study. No focal consolidation identified. No large pleural effusion. No pneumothorax. IMPRESSION: Cardiomegaly with mild pulmonary vascular congestion. Electronically Signed   By: [name] Hickelaney  [name] M.D.   On: 03/11/2022 15:45   ? ?Cardiac Studies  ? ?2D echo 03/01/2022 ?IMPRESSIONS  ? ? 1. Left ventricular ejection fraction, by estimation, is 60 to 65%. Left  ?ventricular ejection fraction by 2D MOD biplane is 61.6 %. The left  ?ventricle has normal function. The left ventricle has no regional wall  ?motion abnormalities. There is mild left  ?ventricular hypertrophy. Left ventricular diastolic function could not be  ?evaluated.  ? 2. Right ventricular systolic function is normal. The right ventricular  ?size is normal. There is mildly elevated pulmonary artery systolic  ?pressure. The estimated right  ventricular systolic pressure is 36.3 mmHg.  ? 3. Left atrial size was moderately dilated.  ? 4. The mitral valve is grossly normal. Mild mitral valve regurgitation.  ? 5. The aortic valve is tricuspid. Aortic valve regurgitation is trivial.  ?Aortic valve sclerosis is present, with no evidence of aortic valve  ?stenosis. Aortic regurgitation PHT measures 518 msec.  ? 6. The inferior vena cava is dilated in size with >50% respiratory  ?variability, suggesting right atrial pressure of 8 mmHg.  ? ?Patient Profile  ?   ?[name], chronic anticoagulation, [name], HTN, asthma,  who is being seen 03/12/2022 for the evaluation of SOB at the request of Dr. [name]. ? ?Assessment & Plan  ?  ?Atrial fibrillation with RVR ?-She was hospitalized 2 weeks ago with recurrent atrial fibrillation and medications were adjusted for rate control and she was to follow-up with [name] to discuss possible outpatient elective cardioversion but this has not occurred yet ?-She remains in atrial fibrillation and on admission heart rate of 118 bpm and was started on BB but HR this am in the 140's ?-continue Apixaban 5 mg twice daily and Lopressor 25 mg twice daily ?-She was on Cardizem CD 180 mg daily at home but this has been held on this admission and started on beta-blocker therapy ?-SBP dropped during the night after getting IV Lasix but now normal ?-HR in the 140's so will start IV Cardizem gtt with bolus of 10mg  and gtt at 5mg /hr and titrate as BP allows ?-2D echo 03/01/2022 showed normal LV function with EF 60 to 65% and moderate left atrial enlargement. ?-She has not missed any doses of her Eliquis>>I reviewed her recent admission a few weeks ago and she took her Eliquis twice the day of admission and received it BID while hospitalized and took her PM dose when she got home.  ON this admission she had taken her am dose and [facility] and was given her PM dose last on 4/16 in ER and has received all  doses BID since then and states and has not missed any doses at home ?-will make NPO after MD for DCCV tomorrow ?-Shared Decision Making/Informed Consent ?The risks (stroke, cardiac arrhythmias rarely resulting in the need for a temporary or permanent pacemaker, skin irritation or burns and complications associated with conscious sedation including aspiration, arrhythmia, respiratory failure and death),  benefits (restoration of normal sinus rhythm) and alternatives of a direct current cardioversion were explained in detail to Ms. Riden and she agrees to proceed.  ?  ?Acute on chronic diastolic CHF ?-Suspect related to ongoing atrial fibrillation with poorly controlled heart rate ?-BNP was elevated at 297 on admission with vascular congestion on chest x-ray ?-She is now on IV Lasix and is put out 1.15L yesterday and is still net + 190cc ?-SCr 0.91 and K+ 3.6 today ?-She had some sharp pains with inspiration last night and at rest but suspect MSK or related to rapid HR ?-Increase lasix to 40mg  IV BID to promote better diuresis ?-follow strict I's and O's, daily weights and renal function ?-Replete potassium to keep greater than 4 and magnesium greater than 2 ?-volume status should improve after restoring NSR ?  ?Hypertension ?-BP controlled on exam today ?-continue BB and starting IV CCB for HR control ? ?I have spent a total of 35 minutes with patient reviewing 2D echo , telemetry, EKGs, labs and examining patient as well as establishing an assessment and plan that was discussed with the patient.  > 50% of time was spent in direct patient care.    ? ?  ?Risk Assessment/Risk Scores:  ?    :   ? ?New York Heart Association (NYHA) Functional Class ?NYHA Class II ?  ?CHA2DS2-VASc Score = 4  ?{This indicates a 4.8% annual risk of stroke. ?The patient's score is based upon: ?CHF History: 1 ?HTN History: 1 ?Diabetes History: 0 ?Stroke History: 0 ?Vascular Disease History: 0 ?Age Score: 1 ?Gender Score: 1 ?    ? ?For questions or updates, please contact [facility] ?Please consult [email or web address] for contact info under  ? ?  ?   ?Signed, ?[id number]}, MD  ?03/13/2022, 10:57 AM    ?

## 2022-03-13 NOTE — Plan of Care (Signed)

## 2022-03-14 DIAGNOSIS — I480 Paroxysmal atrial fibrillation: Secondary | ICD-10-CM | POA: Diagnosis not present

## 2022-03-14 DIAGNOSIS — I4891 Unspecified atrial fibrillation: Secondary | ICD-10-CM | POA: Diagnosis not present

## 2022-03-14 DIAGNOSIS — I5033 Acute on chronic diastolic (congestive) heart failure: Secondary | ICD-10-CM | POA: Diagnosis not present

## 2022-03-14 LAB — MAGNESIUM: Magnesium: 1.9 mg/dL (ref 1.7–2.4)

## 2022-03-14 LAB — PHOSPHORUS: Phosphorus: 4.5 mg/dL (ref 2.5–4.6)

## 2022-03-14 LAB — BASIC METABOLIC PANEL
Anion gap: 6 (ref 5–15)
BUN: 19 mg/dL (ref 8–23)
CO2: 32 mmol/L (ref 22–32)
Calcium: 8.6 mg/dL — ABNORMAL LOW (ref 8.9–10.3)
Chloride: 101 mmol/L (ref 98–111)
Creatinine, Ser: 0.93 mg/dL (ref 0.44–1.00)
GFR, Estimated: >60 mL/min (ref >60)
Glucose, Bld: 108 mg/dL — ABNORMAL HIGH (ref 70–99)
Potassium: 3.5 mmol/L (ref 3.5–5.1)
Sodium: 139 mmol/L (ref 135–145)

## 2022-03-14 LAB — CBC
HCT: 36.9 % (ref 36.0–46.0)
Hemoglobin: 12.3 g/dL (ref 12.0–15.0)
MCH: 33.9 pg (ref 26.0–34.0)
MCHC: 33.3 g/dL (ref 30.0–36.0)
MCV: 101.7 fL — ABNORMAL HIGH (ref 80.0–100.0)
Platelets: 160 10*3/uL (ref 150–400)
RBC: 3.63 MIL/uL — ABNORMAL LOW (ref 3.87–5.11)
RDW: 12.9 % (ref 11.5–15.5)
WBC: 6.8 10*3/uL (ref 4.0–10.5)
nRBC: 0 % (ref 0.0–0.2)

## 2022-03-14 MED ORDER — ORAL CARE MOUTH RINSE
15.0000 mL | Freq: Two times a day (BID) | OROMUCOSAL | Status: DC
  Administered 2022-03-14 – 2022-03-16 (×5): 15 mL via OROMUCOSAL

## 2022-03-14 MED ORDER — MELATONIN 5 MG PO TABS
5.0000 mg | ORAL_TABLET | Freq: Once | ORAL | Status: AC
  Administered 2022-03-14: 5 mg via ORAL
  Filled 2022-03-14: qty 1

## 2022-03-14 NOTE — TOC Progression Note (Signed)
Transition of Care (TOC) - Progression Note  ? ? ?Patient Details  ?Name: MAXIENE TOBUREN ?MRN: [id number] ?Date of Birth: [birth year] ? ?Transition of Care (TOC) CM/SW Contact  ?Purcell Mouton, RN ?Phone Number: ?03/14/2022, 4:05 PM ? ?Clinical Narrative:    ? ? ?Transition of Care (TOC) Screening Note ? ? ?Patient Details  ?Name: SKYELYNN REHMAN ?Date of Birth: [birth year] ? ? ?Transition of Care (TOC) CM/SW Contact:    ?Purcell Mouton, RN ?Phone Number: ?03/14/2022, 4:05 PM ? ? ? ?Transition of Care Department [facility]) has reviewed patient and no TOC needs have been identified at this time. We will continue to monitor patient advancement through interdisciplinary progression rounds. If new patient transition needs arise, please place a TOC consult. ?  ? ?  ?  ? ?Expected Discharge Plan and Services ?  ?  ?  ?  ?  ?                ?  ?  ?  ?  ?  ?  ?  ?  ?  ?  ? ? ?Social Determinants of Health (SDOH) Interventions ?  ? ?Readmission Risk Interventions ?   ? View : No data to display.  ?  ?  ?  ? ? ?

## 2022-03-14 NOTE — Progress Notes (Signed)
[facility] ?PROGRESS NOTE ? ? ? ?Progress Note  ?[name] [name]  ZOX:[id number] DOB: 06-Nov-1951 DOA: 03/11/2022 ?PCP: Mirna MiresHill, Gerald, MD  ? ? ? ?Brief Narrative:  ? ?[name] LimaRuth W [name] is an 71 y.o. female past medical history significant for atrial fibrillation on Eliquis, chronic diastolic heart failure essential hypertension, recently diagnosed with A-fib with RVR on dental office appointment recently discharged on 03/02/2022 for A-fib with RVR on Cardizem returns with increased shortness of breath at rest and with exertion was found to be in acute diastolic heart failure and A-fib with RVR cardiology has been consulted. ? ? ?Assessment/Plan:  ? ?Persistent atrial fibrillation with RVR: ?Start on IV diltiazem and oral metoprolol continue Eliquis. ?Cardiology was consulted further management per cardiology. ?Cardiology has a plan for DCCV on 03/15/2022. ? ?Acute on chronic diastolic heart failure: ?Ongoing diuresis. ?Continue strict I's and O's and daily weights. ?Continue beta-blockers ?She is negative about a liter. ?Further management per cardiology. ? ?Chronic anxiety: ?Continue buspirone. ? ?Essential hypertension: ?BP stable continue to monitor her vitals closely. ?Continue current regimen. ? ? ? ? ?DVT prophylaxis: eliquis ?Family Communication:none ?Status is: Inpatient ?Remains inpatient appropriate because: A-fib with RVR ? ? ? ?Code Status:  ? ?  ?Code Status Orders  ?(From admission, onward)  ?  ? ? ?  ? ?  Start     Ordered  ? 03/12/22 1350  Full code  Continuous       ? 03/12/22 1349  ? ?  ?  ? ?  ? ?Code Status History   ? ? Date Active Date Inactive Code Status Order ID Comments User Context  ? 02/28/2022 2326 03/02/2022 2357 Full Code [id number]  Angie FavaHowerter, Justin B, DO Inpatient  ? 08/16/2016 1543 08/17/2016 1801 Full Code [id number]  Kathryne HitchBlackman, Christopher Y, MD Inpatient  ? ?  ? ? ? ? ?IV Access:  ? ?Peripheral IV ? ? ?Procedures and diagnostic studies:  ? ?No results found. ? ? ?Medical Consultants:   ? ?None. ? ? ?Subjective:  ? ? ?[name] [name] has no new complaints. ? ?Objective:  ? ? ?Vitals:  ? 03/13/22 2211 03/14/22 0247 03/14/22 0630 03/14/22 1106  ?BP: 104/78 90/77 91/79  97/81  ?Pulse: 99 80 100 80  ?Resp: (!) 22 15 16 18   ?Temp: 98.7 ?F (37.1 ?C) 98.5 ?F (36.9 ?C) 98.3 ?F (36.8 ?C) 99.3 ?F (37.4 ?C)  ?TempSrc: Oral Oral Oral Oral  ?SpO2: 97% 97% 97% 97%  ?Weight:      ?Height:      ? ?SpO2: 97 % ?O2 Flow Rate (L/min): 2 L/min ? ? ?Intake/Output Summary (Last 24 hours) at 03/14/2022 1113 ?Last data filed at 03/14/2022 0600 ?Gross per 24 hour  ?Intake 533.59 ml  ?Output 2050 ml  ?Net -1516.41 ml  ? ?Filed Weights  ? 03/11/22 2324 03/12/22 0500 03/13/22 0500  ?Weight: 100.9 kg 102.4 kg 103 kg  ? ? ?Exam: ?General exam: In no acute distress. ?Respiratory system: Good air movement and clear to auscultation. ?Cardiovascular system: S1 & S2 heard, RRR. No JVD. ?Gastrointestinal system: Abdomen is nondistended, soft and nontender.  ?Extremities: No pedal edema. ?Skin: No rashes, lesions or ulcers ?Psychiatry: Judgement and insight appear normal. Mood & affect appropriate.  ? ? ?Data Reviewed:  ? ? ?Labs: ?Basic Metabolic Panel: ?Recent Labs  ?Lab 03/11/22 ?1508 03/11/22 ?2311 03/12/22 ?0109 03/14/22 ?0424  ?NA 141 142 142 139  ?K 4.2 3.4* 3.6 3.5  ?CL 106 105 107 101  ?CO2 27 27  29 32  ?GLUCOSE 94 149* 110* 108*  ?BUN 10 13 13 19   ?CREATININE 0.83 0.93 0.91 0.93  ?CALCIUM 9.0 8.6* 8.4* 8.6*  ?MG 1.9 2.0  --  1.9  ?PHOS  --   --   --  4.5  ? ?GFR ?Estimated Creatinine Clearance: 65.8 mL/min (by C-G formula based on SCr of 0.93 mg/dL). ?Liver Function Tests: ?Recent Labs  ?Lab 03/11/22 ?1508 03/11/22 ?2311  ?AST 32 30  ?ALT 65* 62*  ?ALKPHOS 44 46  ?BILITOT 0.7 0.5  ?PROT 6.8 6.6  ?ALBUMIN 4.0 3.8  ? ?No results for input(s): LIPASE, AMYLASE in the last 168 hours. ?No results for input(s): AMMONIA in the last 168 hours. ?Coagulation profile ?No results for input(s): INR, PROTIME in the last 168 hours. ?COVID-19  Labs ? ?No results for input(s): DDIMER, FERRITIN, LDH, CRP in the last 72 hours. ? ?Lab Results  ?Component Value Date  ? SARSCOV2NAA NEGATIVE 03/11/2022  ? ? ?CBC: ?Recent Labs  ?Lab 03/11/22 ?1508 03/12/22 ?0109 03/14/22 ?0424  ?WBC 5.8 7.0 6.8  ?NEUTROABS 3.3  --   --   ?HGB 12.8 12.5 12.3  ?HCT 38.5 37.2 36.9  ?MCV 100.0 101.6* 101.7*  ?PLT 164 179 160  ? ?Cardiac Enzymes: ?No results for input(s): CKTOTAL, CKMB, CKMBINDEX, TROPONINI in the last 168 hours. ?BNP (last 3 results) ?No results for input(s): PROBNP in the last 8760 hours. ?CBG: ?No results for input(s): GLUCAP in the last 168 hours. ?D-Dimer: ?No results for input(s): DDIMER in the last 72 hours. ?Hgb A1c: ?No results for input(s): HGBA1C in the last 72 hours. ?Lipid Profile: ?No results for input(s): CHOL, HDL, LDLCALC, TRIG, CHOLHDL, LDLDIRECT in the last 72 hours. ?Thyroid function studies: ?Recent Labs  ?  03/11/22 ?2311  ?TSH 3.685  ? ?Anemia work up: ?No results for input(s): VITAMINB12, FOLATE, FERRITIN, TIBC, IRON, RETICCTPCT in the last 72 hours. ?Sepsis Labs: ?Recent Labs  ?Lab 03/11/22 ?1508 03/12/22 ?0109 03/14/22 ?0424  ?WBC 5.8 7.0 6.8  ? ?Microbiology ?Recent Results (from the past 240 hour(s))  ?Resp Panel by RT-PCR (Flu A&B, Covid) Nasopharyngeal Swab     Status: None  ? Collection Time: 03/11/22  5:26 PM  ? Specimen: Nasopharyngeal Swab; Nasopharyngeal(NP) swabs in vial transport medium  ?Result Value Ref Range Status  ? SARS Coronavirus 2 by RT PCR NEGATIVE NEGATIVE Final  ?  Comment: (NOTE) ?SARS-CoV-2 target nucleic acids are NOT DETECTED. ? ?The SARS-CoV-2 RNA is generally detectable in upper respiratory ?specimens during the acute phase of infection. The lowest ?concentration of SARS-CoV-2 viral copies this assay can detect is ?138 copies/mL. A negative result does not preclude SARS-Cov-2 ?infection and should not be used as the sole basis for treatment or ?other patient management decisions. A negative result may occur with   ?improper specimen collection/handling, submission of specimen other ?than nasopharyngeal swab, presence of viral mutation(s) within the ?areas targeted by this assay, and inadequate number of viral ?copies(<138 copies/mL). A negative result must be combined with ?clinical observations, patient history, and epidemiological ?information. The expected result is Negative. ? ?Fact Sheet for Patients:  ?03/13/22 ? ?Fact Sheet for Healthcare Providers:  ?BloggerCourse.com ? ?This test is no t yet approved or cleared by the [place] FDA and  ?has been authorized for detection and/or diagnosis of SARS-CoV-2 by ?FDA under an Emergency Use Authorization (EUA). This EUA will remain  ?in effect (meaning this test can be used) for the duration of the ?COVID-19 declaration under Section 564(b)(1) of  the Act, 21 ?U.S.C.section 360bbb-3(b)(1), unless the authorization is terminated  ?or revoked sooner.  ? ? ?  ? Influenza A by PCR NEGATIVE NEGATIVE Final  ? Influenza B by PCR NEGATIVE NEGATIVE Final  ?  Comment: (NOTE) ?The Xpert Xpress SARS-CoV-2/FLU/RSV plus assay is intended as an aid ?in the diagnosis of influenza from Nasopharyngeal swab specimens and ?should not be used as a sole basis for treatment. Nasal washings and ?aspirates are unacceptable for Xpert Xpress SARS-CoV-2/FLU/RSV ?testing. ? ?Fact Sheet for Patients: ?BloggerCourse.com ? ?Fact Sheet for Healthcare Providers: ?[employer]it ? ?This test is not yet approved or cleared by the [place] FDA and ?has been authorized for detection and/or diagnosis of SARS-CoV-2 by ?FDA under an Emergency Use Authorization (EUA). This EUA will remain ?in effect (meaning this test can be used) for the duration of the ?COVID-19 declaration under Section 564(b)(1) of the Act, 21 U.S.C. ?section 360bbb-3(b)(1), unless the authorization is terminated  or ?revoked. ? ?Performed at [facility], [address] [place] ?[place], [place] [address] ?  ? ? ? ?Medications:  ? ? apixaban  5 mg Oral BID  ? busPIRone  5 mg Oral BID  ? cholecalciferol  2,000 Units Oral Daily  ? furosemide

## 2022-03-14 NOTE — Evaluation (Signed)
Occupational Therapy Evaluation ?Patient Details ?Name: [name] [name] ?MRN: [id number] ?DOB: 31-Jan-1951 ?Today's Date: 03/14/2022 ? ? ?History of Present Illness 71 y.o. female with afib with RVR. PMH: PAF, chronic anticoagulation, [name], HTN, asthma, L ankle fx  ? ?Clinical Impression ?  ?Ms. 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 On evaluation she demonstrates independence with ADLs and mobility. She was found on 2 L [place] at 97%. On RA seated at side of bed she was 95%. After ambulating in room she was 93%. No complaints of fatigue or weakness. HR as high as 112. Patient is independent and has no OT needs. ?   ? ?Recommendations for follow up therapy are one component of a multi-disciplinary discharge planning process, led by the attending physician.  Recommendations may be updated based on patient status, additional functional criteria and insurance authorization.  ? ?Follow Up Recommendations ? No OT follow up  ?  ?Assistance Recommended at Discharge None  ?Patient can return home with the following   ? ?  ?Functional Status Assessment ? Patient has not had a recent decline in their functional status  ?Equipment Recommendations ?    ?  ?Recommendations for Other Services   ? ? ?  ?Precautions / Restrictions Precautions ?Precautions: None ?Precaution Comments: monitor HR ?Restrictions ?Weight Bearing Restrictions: No  ? ?  ? ?Mobility Bed Mobility ?Overal bed mobility: Independent ?  ?  ?  ?  ?  ?  ?  ?  ? ?Transfers ?Overall transfer level: Independent ?  ?  ?  ?  ?  ?  ?  ?  ?  ?  ? ?  ?Balance Overall balance assessment: No apparent balance deficits (not formally assessed) ?  ?  ?  ?  ?  ?  ?  ?  ?  ?  ?  ?  ?  ?  ?  ?  ?  ?  ?   ? ?ADL either performed or assessed with clinical judgement  ? ?ADL Overall ADL's : Independent ?  ?  ?  ?  ?  ?  ?  ?  ?  ?  ?  ?  ?  ?  ?  ?  ?  ?  ?  ?   ? ? ? ?Vision Patient Visual Report: No change from baseline ?   ?   ?Perception   ?   ?Praxis   ?  ? ?Pertinent Vitals/Pain Pain Assessment ?Pain Assessment: No/denies pain  ? ? ? ?Hand Dominance   ?  ?Extremity/Trunk Assessment Upper Extremity Assessment ?Upper Extremity Assessment: Overall WFL for tasks assessed ?  ?Lower Extremity Assessment ?Lower Extremity Assessment: Overall WFL for tasks assessed ?  ?Cervical / Trunk Assessment ?Cervical / Trunk Assessment: Normal ?  ?Communication Communication ?Communication: No difficulties ?  ?Cognition Arousal/Alertness: Awake/alert ?Behavior During Therapy: [facility] for tasks assessed/performed ?Overall Cognitive Status: Within Functional Limits for tasks assessed ?  ?  ?  ?  ?  ?  ?  ?  ?  ?  ?  ?  ?  ?  ?  ?  ?  ?  ?  ?General Comments    ? ?  ?Exercises   ?  ?Shoulder Instructions    ? ? ?Home Living Family/patient expects to be discharged to:: Private residence ?Living Arrangements: Alone ?Available Help at Discharge: Friend(s);Available PRN/intermittently ?Type of Home: House ?Home Access: Stairs to enter ?Entrance Stairs-Number of  Steps: 5-typically goes up one step at time ?Entrance Stairs-Rails: Left ?Home Layout: One level ?  ?  ?Bathroom Shower/Tub: Tub/shower unit ?  ?Bathroom Toilet: Standard ?  ?  ?Home Equipment: None ?  ?  ?  ? ?  ?Prior Functioning/Environment Prior Level of Function : Independent/Modified Independent ?  ?  ?  ?  ?  ?  ?Mobility Comments: pt reports she amb more slowly since ankle fx in 2017. Works as a Lobbyist ?ADLs Comments: independent ?  ? ?  ?  ?OT Problem List:   ?  ?   ?OT Treatment/Interventions:    ?  ?OT Goals(Current goals can be found in the care plan section) Acute Rehab OT Goals ?OT Goal Formulation: All assessment and education complete, DC therapy  ?OT Frequency:   ?  ? ?Co-evaluation   ?  ?  ?  ?  ? ?  ?AM-PAC OT "6 Clicks" Daily Activity     ?Outcome Measure Help from another person eating meals?: None ?Help from another person taking care of personal grooming?: None ?Help from another person  toileting, which includes using toliet, bedpan, or urinal?: None ?Help from another person bathing (including washing, rinsing, drying)?: None ?Help from another person to put on and taking off regular upper body clothing?: None ?Help from another person to put on and taking off regular lower body clothing?: None ?6 Click Score: 24 ?  ?End of Session Equipment Utilized During Treatment: Oxygen ?Nurse Communication:  (okay to see) ? ?Activity Tolerance: Patient tolerated treatment well ?Patient left: in bed ? ?OT Visit Diagnosis: Muscle weakness (generalized) (M62.81)  ?              ?Time: 4287-6811 ?OT Time Calculation (min): 9 min ?Charges:  OT General Charges ?$OT Visit: 1 Visit ?OT Evaluation ?$OT Eval Low Complexity: 1 Low ? ?Toron Bowring, OTR/L ?[facility]  ?Office [phone] ?Pager: [phone]  ? ?Davyon Fisch L Kadee Philyaw ?03/14/2022, 12:46 PM ?

## 2022-03-14 NOTE — Progress Notes (Signed)
? ?Progress Note ? ?Patient Name: [name] [name] ?Date of Encounter: 03/14/2022 ? ?[facility] Cardiologist: Pixie Casino, MD  ? ?Subjective  ? ?Denies any chest pain or shortness of breath.  Heart rate in the 90s in atrial fibrillation ? ?Inpatient Medications  ?  ?Scheduled Meds: ? apixaban  5 mg Oral BID  ? busPIRone  5 mg Oral BID  ? cholecalciferol  2,000 Units Oral Daily  ? furosemide  40 mg Intravenous BID  ? mouth rinse  15 mL Mouth Rinse BID  ? metoprolol tartrate  25 mg Oral BID  ? multivitamin with minerals  1 tablet Oral Daily  ? potassium chloride  20 mEq Oral Daily  ? ?Continuous Infusions: ? diltiazem (CARDIZEM) infusion 10 mg/hr (03/13/22 2302)  ? ?PRN Meds: ?acetaminophen, levalbuterol  ? ?Vital Signs  ?  ?Vitals:  ? 03/13/22 2211 03/14/22 0247 03/14/22 0630 03/14/22 1106  ?BP: 104/78 90/77 91/79  97/81  ?Pulse: 99 80 100 80  ?Resp: (!) 22 15 16 18   ?Temp: 98.7 ?F (37.1 ?C) 98.5 ?F (36.9 ?C) 98.3 ?F (36.8 ?C) 99.3 ?F (37.4 ?C)  ?TempSrc: Oral Oral Oral Oral  ?SpO2: 97% 97% 97% 97%  ?Weight:      ?Height:      ? ? ?Intake/Output Summary (Last 24 hours) at 03/14/2022 1142 ?Last data filed at 03/14/2022 0600 ?Gross per 24 hour  ?Intake 533.59 ml  ?Output 2050 ml  ?Net -1516.41 ml  ? ? ? ?  03/13/2022  ?  5:00 AM 03/12/2022  ?  5:00 AM 03/11/2022  ? 11:24 PM  ?Last 3 Weights  ?Weight (lbs) 227 lb 1.6 oz 225 lb 12.8 oz 222 lb 7.1 oz  ?Weight (kg) 103.012 kg 102.422 kg 100.9 kg  ?   ? ?Telemetry  ?  ?Atrial fibrillation with controlled ventricular sponsor heart rate in the 90s- Personally Reviewed ? ?ECG  ?  ?No new EKG to review - Personally Reviewed ? ?Physical Exam  ? ?GEN: Well nourished, well developed in no acute distress ?HEENT: Normal ?NECK: No JVD; No carotid bruits ?LYMPHATICS: No lymphadenopathy ?CARDIAC:Irregular irregular, no murmurs, rubs, gallops ?RESPIRATORY:  Clear to auscultation without rales, wheezing or rhonchi  ?ABDOMEN: Soft, non-tender, non-distended ?MUSCULOSKELETAL:  No edema; No  deformity  ?SKIN: Warm and dry ?NEUROLOGIC:  Alert and oriented x 3 ?PSYCHIATRIC:  Normal affect   ?Labs  ?  ?High Sensitivity Troponin:   ?Recent Labs  ?Lab 02/28/22 ?2148 03/11/22 ?1508 03/11/22 ?1708 03/11/22 ?2311 03/12/22 ?0109  ?TROPONINIHS 3 3 3 3 3   ? ?   ? ?Chemistry ?Recent Labs  ?Lab 03/11/22 ?1508 03/11/22 ?2311 03/12/22 ?0109 03/14/22 ?0424  ?NA 141 142 142 139  ?K 4.2 3.4* 3.6 3.5  ?CL 106 105 107 101  ?CO2 27 27 29  32  ?GLUCOSE 94 149* 110* 108*  ?BUN 10 13 13 19   ?CREATININE 0.83 0.93 0.91 0.93  ?CALCIUM 9.0 8.6* 8.4* 8.6*  ?PROT 6.8 6.6  --   --   ?ALBUMIN 4.0 3.8  --   --   ?AST 32 30  --   --   ?ALT 65* 62*  --   --   ?ALKPHOS 44 46  --   --   ?BILITOT 0.7 0.5  --   --   ?GFRNONAA >60 >60 >60 >60  ?ANIONGAP 8 10 6 6   ? ?  ? ?Hematology ?Recent Labs  ?Lab 03/11/22 ?1508 03/12/22 ?0109 03/14/22 ?0424  ?WBC 5.8 7.0 6.8  ?RBC 3.85*  3.66* 3.63*  ?HGB 12.8 12.5 12.3  ?HCT 38.5 37.2 36.9  ?MCV 100.0 101.6* 101.7*  ?MCH 33.2 34.2* 33.9  ?MCHC 33.2 33.6 33.3  ?RDW 12.9 12.9 12.9  ?PLT 164 179 160  ? ? ? ?BNP ?Recent Labs  ?Lab 03/11/22 ?1508  ?BNP 297.3*  ? ?  ? ?DDimer No results for input(s): DDIMER in the last 168 hours. ? ? ?CHA2DS2-VASc Score = 4  ?This indicates a 4.8% annual risk of stroke. ?The patient's score is based upon: ?CHF History: 1 ?HTN History: 1 ?Diabetes History: 0 ?Stroke History: 0 ?Vascular Disease History: 0 ?Age Score: 1 ?Gender Score: 1 ? ?Radiology  ?  ?No results found. ? ?Cardiac Studies  ? ?2D echo 03/01/2022 ?IMPRESSIONS  ? ? 1. Left ventricular ejection fraction, by estimation, is 60 to 65%. Left  ?ventricular ejection fraction by 2D MOD biplane is 61.6 %. The left  ?ventricle has normal function. The left ventricle has no regional wall  ?motion abnormalities. There is mild left  ?ventricular hypertrophy. Left ventricular diastolic function could not be  ?evaluated.  ? 2. Right ventricular systolic function is normal. The right ventricular  ?size is normal. There is mildly  elevated pulmonary artery systolic  ?pressure. The estimated right ventricular systolic pressure is [id number] mmHg.  ? 3. Left atrial size was moderately dilated.  ? 4. The mitral valve is grossly normal. Mild mitral valve regurgitation.  ? 5. The aortic valve is tricuspid. Aortic valve regurgitation is trivial.  ?Aortic valve sclerosis is present, with no evidence of aortic valve  ?stenosis. Aortic regurgitation PHT measures 518 msec.  ? 6. The inferior vena cava is dilated in size with >50% respiratory  ?variability, suggesting right atrial pressure of 8 mmHg.  ? ?Patient Profile  ?   ?[name], chronic anticoagulation, [name], HTN, asthma,  who is being seen 03/12/2022 for the evaluation of SOB at the request of Dr. [place] [name]. ? ?Assessment & Plan  ?  ?Atrial fibrillation with RVR ?-She was hospitalized 2 weeks ago with recurrent atrial fibrillation and medications were adjusted for rate control and she was to follow-up with Dr. Debara Pickett to discuss possible outpatient elective cardioversion but this has not occurred yet ?-She remains in atrial fibrillation and on admission heart rate of 118 bpm and was started on BB but HR this am in the 140's ?-continue Apixaban 5 mg twice daily and Lopressor 25 mg twice daily ?-She was on Cardizem CD 180 mg daily at home but this has been held on this admission and started on beta-blocker therapy ?-SBP dropped during the night after getting IV Lasix but now normal ?-HR in the 140's so will start IV Cardizem gtt with bolus of 10mg  and gtt at 5mg /hr and titrate as BP allows ?-2D echo 03/01/2022 showed normal LV function with EF 60 to 65% and moderate left atrial enlargement. ?-She has not missed any doses of her Eliquis>>I reviewed her recent admission a few weeks ago and she took her Eliquis twice the day of admission and received it BID while hospitalized and took her PM dose when she got home.  ON this admission she had taken her am dose and [facility]  and was given her PM dose last on 4/16 in ER and has received all doses BID since then and states and has not missed any doses at home ?-Plan for cardioversion tomorrow. ?  ?Acute on chronic diastolic CHF ?-Suspect related to  ongoing atrial fibrillation with poorly controlled heart rate ?-BNP was elevated at 297 on admission with vascular congestion on chest x-ray ?-She is now on IV Lasix and is put out 2 L yesterday and is net -846 cc since admission ?-SCr 0.91 and K+ 3.6 today stable at 0.93 and potassium 3.5 ?-Continue IV lasix 40mg  IV BID ? ? ?-follow strict I's and O's, daily weights and renal function ?-Replete potassium to keep greater than 4 and magnesium greater than 2 ?-volume status should improve after restoring NSR ?  ?Hypertension ?-BP on the soft side at 97/81 mmHg ?-Continue Cardizem drip IV and Lopressor 25 mg twice daily ? ? ?Risk Assessment/Risk Scores:  ?    :[id number]   ? ?New York Heart Association (NYHA) Functional Class ?NYHA Class II ?  ?CHA2DS2-VASc Score = 4  ?{This indicates a 4.8% annual risk of stroke. ?The patient's score is based upon: ?CHF History: 1 ?HTN History: 1 ?Diabetes History: 0 ?Stroke History: 0 ?Vascular Disease History: 0 ?Age Score: 1 ?Gender Score: 1 ?   ? ?For questions or updates, please contact Zeb ?Please consult [email or web address] for contact info under  ? ?  ?   ?Signed, ?Fransico Him, MD  ?03/14/2022, 11:42 AM    ?

## 2022-03-15 ENCOUNTER — Inpatient Hospital Stay (HOSPITAL_COMMUNITY): Payer: No Typology Code available for payment source | Admitting: Certified Registered Nurse Anesthetist

## 2022-03-15 ENCOUNTER — Encounter (HOSPITAL_COMMUNITY)
Admission: EM | Disposition: A | Discharge status: Home / Self Care | Payer: Self-pay | Source: Home / Self Care | Attending: Internal Medicine

## 2022-03-15 ENCOUNTER — Encounter (HOSPITAL_COMMUNITY): Payer: Self-pay | Admitting: Internal Medicine

## 2022-03-15 DIAGNOSIS — M1990 Unspecified osteoarthritis, unspecified site: Secondary | ICD-10-CM

## 2022-03-15 DIAGNOSIS — I509 Heart failure, unspecified: Secondary | ICD-10-CM

## 2022-03-15 DIAGNOSIS — I4819 Other persistent atrial fibrillation: Principal | ICD-10-CM

## 2022-03-15 DIAGNOSIS — I110 Hypertensive heart disease with heart failure: Secondary | ICD-10-CM

## 2022-03-15 DIAGNOSIS — I4891 Unspecified atrial fibrillation: Secondary | ICD-10-CM

## 2022-03-15 DIAGNOSIS — I5033 Acute on chronic diastolic (congestive) heart failure: Secondary | ICD-10-CM | POA: Diagnosis not present

## 2022-03-15 HISTORY — PX: CARDIOVERSION: SHX1299

## 2022-03-15 LAB — PROTIME-INR
INR: 1.4 — ABNORMAL HIGH (ref 0.8–1.2)
Prothrombin Time: 16.7 seconds — ABNORMAL HIGH (ref 11.4–15.2)

## 2022-03-15 LAB — BASIC METABOLIC PANEL
Anion gap: 8 (ref 5–15)
BUN: 17 mg/dL (ref 8–23)
CO2: 30 mmol/L (ref 22–32)
Calcium: 8.8 mg/dL — ABNORMAL LOW (ref 8.9–10.3)
Chloride: 99 mmol/L (ref 98–111)
Creatinine, Ser: 0.79 mg/dL (ref 0.44–1.00)
GFR, Estimated: >60 mL/min (ref >60)
Glucose, Bld: 121 mg/dL — ABNORMAL HIGH (ref 70–99)
Potassium: 3.5 mmol/L (ref 3.5–5.1)
Sodium: 137 mmol/L (ref 135–145)

## 2022-03-15 SURGERY — CARDIOVERSION
Anesthesia: General

## 2022-03-15 MED ORDER — PHENYLEPHRINE 80 MCG/ML (10ML) SYRINGE FOR IV PUSH (FOR BLOOD PRESSURE SUPPORT)
PREFILLED_SYRINGE | INTRAVENOUS | Status: DC | PRN
  Administered 2022-03-15: 160 ug via INTRAVENOUS

## 2022-03-15 MED ORDER — POTASSIUM CHLORIDE 20 MEQ PO PACK
40.0000 meq | PACK | Freq: Two times a day (BID) | ORAL | Status: DC
  Filled 2022-03-15: qty 2

## 2022-03-15 MED ORDER — FUROSEMIDE 20 MG PO TABS
20.0000 mg | ORAL_TABLET | Freq: Every day | ORAL | Status: DC
  Administered 2022-03-16: 20 mg via ORAL
  Filled 2022-03-15: qty 1

## 2022-03-15 MED ORDER — PROPOFOL 10 MG/ML IV BOLUS
INTRAVENOUS | Status: DC | PRN
  Administered 2022-03-15: 70 mg via INTRAVENOUS

## 2022-03-15 MED ORDER — SODIUM CHLORIDE 0.9 % IV SOLN: INTRAVENOUS | Status: DC | PRN

## 2022-03-15 MED ORDER — LIDOCAINE 2% (20 MG/ML) 5 ML SYRINGE
INTRAMUSCULAR | Status: DC | PRN
  Administered 2022-03-15: 60 mg via INTRAVENOUS

## 2022-03-15 MED ORDER — SODIUM CHLORIDE 0.9 % IV SOLN: INTRAVENOUS | Status: DC

## 2022-03-15 MED ORDER — POTASSIUM CHLORIDE CRYS ER 20 MEQ PO TBCR
40.0000 meq | EXTENDED_RELEASE_TABLET | Freq: Two times a day (BID) | ORAL | Status: DC
  Administered 2022-03-15: 40 meq via ORAL
  Filled 2022-03-15: qty 2

## 2022-03-15 NOTE — Plan of Care (Signed)

## 2022-03-15 NOTE — H&P (View-Only) (Signed)
? ?Progress Note ? ?Patient Name: [name] [name] ?Date of Encounter: 03/15/2022 ? ?[facility] Cardiologist: Pixie Casino, MD  ? ?Subjective  ? ?Doing well today denies any chest pain, shortness of breath or palpitations.  She is n.p.o. for cardioversion later today. ? ?Inpatient Medications  ?  ?Scheduled Meds: ? apixaban  5 mg Oral BID  ? busPIRone  5 mg Oral BID  ? cholecalciferol  2,000 Units Oral Daily  ? furosemide  40 mg Intravenous BID  ? mouth rinse  15 mL Mouth Rinse BID  ? metoprolol tartrate  25 mg Oral BID  ? multivitamin with minerals  1 tablet Oral Daily  ? potassium chloride  40 mEq Oral BID  ? potassium chloride  20 mEq Oral Daily  ? ?Continuous Infusions: ? diltiazem (CARDIZEM) infusion 10 mg/hr (03/15/22 1050)  ? ?PRN Meds: ?acetaminophen, levalbuterol  ? ?Vital Signs  ?  ?Vitals:  ? 03/14/22 2309 03/15/22 0223 03/15/22 0700 03/15/22 0744  ?BP: 104/80 92/72 99/78    ?Pulse: 81 74 75   ?Resp: 18 20 (!) 22   ?Temp: 98.4 ?F (36.9 ?C) 98 ?F (36.7 ?C) 98.1 ?F (36.7 ?C)   ?TempSrc: Oral Oral Oral   ?SpO2: 98% 97% 97%   ?Weight:    98.7 kg  ?Height:      ? ? ?Intake/Output Summary (Last 24 hours) at 03/15/2022 1107 ?Last data filed at 03/15/2022 0700 ?Gross per 24 hour  ?Intake 456.08 ml  ?Output 1200 ml  ?Net -743.92 ml  ? ? ? ?  03/15/2022  ?  7:44 AM 03/14/2022  ?  6:30 PM 03/13/2022  ?  5:00 AM  ?Last 3 Weights  ?Weight (lbs) 217 lb 9.6 oz 221 lb 12.8 oz 227 lb 1.6 oz  ?Weight (kg) 98.703 kg 100.608 kg 103.012 kg  ?   ? ?Telemetry  ?  ?Atrial fibrillation with controlled ventricular response personally Reviewed ? ?ECG  ?  ?No new EKG to review - Personally Reviewed ? ?Physical Exam  ? ?GEN: Well nourished, well developed in no acute distress ?HEENT: Normal ?NECK: No JVD; No carotid bruits ?LYMPHATICS: No lymphadenopathy ?CARDIAC: Irregularly irregular, no murmurs, rubs, gallops ?RESPIRATORY:  Clear to auscultation without rales, wheezing or rhonchi  ?ABDOMEN: Soft, non-tender,  non-distended ?MUSCULOSKELETAL:  No edema; No deformity  ?SKIN: Warm and dry ?NEUROLOGIC:  Alert and oriented x 3 ?PSYCHIATRIC:  Normal affect   ?Labs  ?  ?High Sensitivity Troponin:   ?Recent Labs  ?Lab 02/28/22 ?2148 03/11/22 ?1508 03/11/22 ?1708 03/11/22 ?2311 03/12/22 ?0109  ?TROPONINIHS 3 3 3 3 3   ? ?   ? ?Chemistry ?Recent Labs  ?Lab 03/11/22 ?1508 03/11/22 ?2311 03/12/22 ?0109 03/14/22 ?0424  ?NA 141 142 142 139  ?K 4.2 3.4* 3.6 3.5  ?CL 106 105 107 101  ?CO2 27 27 29  32  ?GLUCOSE 94 149* 110* 108*  ?BUN 10 13 13 19   ?CREATININE 0.83 0.93 0.91 0.93  ?CALCIUM 9.0 8.6* 8.4* 8.6*  ?PROT 6.8 6.6  --   --   ?ALBUMIN 4.0 3.8  --   --   ?AST 32 30  --   --   ?ALT 65* 62*  --   --   ?ALKPHOS 44 46  --   --   ?BILITOT 0.7 0.5  --   --   ?GFRNONAA >60 >60 >60 >60  ?ANIONGAP 8 10 6 6   ? ?  ? ?Hematology ?Recent Labs  ?Lab 03/11/22 ?1508 03/12/22 ?0109 03/14/22 ?0424  ?  WBC 5.8 7.0 6.8  ?RBC 3.85* 3.66* 3.63*  ?HGB 12.8 12.5 12.3  ?HCT 38.5 37.2 36.9  ?MCV 100.0 101.6* 101.7*  ?MCH 33.2 34.2* 33.9  ?MCHC 33.2 33.6 33.3  ?RDW 12.9 12.9 12.9  ?PLT 164 179 160  ? ? ? ?BNP ?Recent Labs  ?Lab 03/11/22 ?1508  ?BNP 297.3*  ? ?  ? ?DDimer No results for input(s): DDIMER in the last 168 hours. ? ? ?CHA2DS2-VASc Score = 4  ?This indicates a 4.8% annual risk of stroke. ?The patient's score is based upon: ?CHF History: 1 ?HTN History: 1 ?Diabetes History: 0 ?Stroke History: 0 ?Vascular Disease History: 0 ?Age Score: 1 ?Gender Score: 1 ? ?Radiology  ?  ?No results found. ? ?Cardiac Studies  ? ?2D echo 03/01/2022 ?IMPRESSIONS  ? ? 1. Left ventricular ejection fraction, by estimation, is 60 to 65%. Left  ?ventricular ejection fraction by 2D MOD biplane is 61.6 %. The left  ?ventricle has normal function. The left ventricle has no regional wall  ?motion abnormalities. There is mild left  ?ventricular hypertrophy. Left ventricular diastolic function could not be  ?evaluated.  ? 2. Right ventricular systolic function is normal. The right  ventricular  ?size is normal. There is mildly elevated pulmonary artery systolic  ?pressure. The estimated right ventricular systolic pressure is 36.3 mmHg.  ? 3. Left atrial size was moderately dilated.  ? 4. The mitral valve is grossly normal. Mild mitral valve regurgitation.  ? 5. The aortic valve is tricuspid. Aortic valve regurgitation is trivial.  ?Aortic valve sclerosis is present, with no evidence of aortic valve  ?stenosis. Aortic regurgitation PHT measures 518 msec.  ? 6. The inferior vena cava is dilated in size with >50% respiratory  ?variability, suggesting right atrial pressure of 8 mmHg.  ? ?Patient Profile  ?   ?[name], chronic anticoagulation, [name], HTN, asthma,  who is being seen 03/12/2022 for the evaluation of SOB at the request of Dr. Margo Aye. ? ?Assessment & Plan  ?  ?Atrial fibrillation with RVR ?-She was hospitalized 2 weeks ago with recurrent atrial fibrillation and medications were adjusted for rate control and she was to follow-up with Dr. Rennis Golden to discuss possible outpatient elective cardioversion but this has not occurred yet ?-Readmitted with increased abdominal fullness and symptoms of volume overload and found to have heart rate of 118 bpm and was started on BB but heart rate went up into the 140s but her home dose of Cardizem had not been started ?-2D echo 03/01/2022 showed normal LV function with EF 60 to 65% and moderate left atrial enlargement. ?-She has not missed any doses of her Eliquis>>I reviewed her recent admission a few weeks ago and she took her Eliquis twice the day of admission and received it BID while hospitalized and took her PM dose when she got home.  On this admission she had taken her am dose and [facility] and was given her PM dose last on 4/16 in ER and has received all doses BID since then and states and has not missed any doses at home ?-She is n.p.o. for planned cardioversion today ?-Currently on Cardizem drip which will  continue until her cardioversion is done this afternoon and then stop.  -Continue apixaban 5 mg twice daily, Lopressor 25 mg twice daily and consolidate to Toprol-XL 50 mg daily if BP remains stable after cardioversion ?-Final decision on if she goes home Cardizem will be based  on what her heart rate and blood pressure are doing after cardioversion ?  ?Acute on chronic diastolic CHF ?-Suspect related to ongoing atrial fibrillation with poorly controlled heart rate ?-BNP was elevated at 297 on admission with vascular congestion on chest x-ray ?-She is now on IV Lasix and is put out 1.2 L yesterday and is net - 1.59 since admission ?-Weight is down 5 pounds from admission ?-SCr 0.91 and K+ 3.6 yesterday  ?-Check bmet today before cardioversion since we have been diuresing her ?-I think she is near euvolemia so we will change Lasix to 20 mg daily p.o. since I think her volume overload was brought on mainly by atrial fibrillation with RVR ?  ?Hypertension ?-BP on the soft side at 99/78 mmHg ?-Continue Lopressor 25 mg twice daily and consolidate to Toprol-XL 50 mg daily after cardioversion if BP is stable ? ? ?If she remains stable after DC cardioversion and blood pressures normalized I think she will be fine to be discharged home today ? ?Risk Assessment/Risk Scores:  ?    :[id number]   ? ?New York Heart Association (NYHA) Functional Class ?NYHA Class II ?  ?CHA2DS2-VASc Score = 4  ?{This indicates a 4.8% annual risk of stroke. ?The patient's score is based upon: ?CHF History: 1 ?HTN History: 1 ?Diabetes History: 0 ?Stroke History: 0 ?Vascular Disease History: 0 ?Age Score: 1 ?Gender Score: 1 ?   ? ?For questions or updates, please contact [facility] ?Please consult [email or web address] for contact info under  ? ?  ?   ?Signed, ?Fransico Him, MD  ?03/15/2022, 11:07 AM    ?

## 2022-03-15 NOTE — CV Procedure (Signed)
Procedure: Electrical Cardioversion ?Indications:  Atrial Fibrillation ? ?Procedure Details: ? ?Consent: Risks of procedure as well as the alternatives and risks of each were explained to the (patient/caregiver).  Consent for procedure obtained. ? ?Time Out: Verified patient identification, verified procedure, site/side was marked, verified correct patient position, special equipment/implants available, medications/allergies/relevent history reviewed, required imaging and test results available. PERFORMED. ? ?Patient placed on cardiac monitor, pulse oximetry, supplemental oxygen as necessary.  ?Sedation given:  propofol per anesthesia ?Pacer pads placed anterior and posterior chest. ? ?Cardioverted 1 time(s).  ?Cardioversion with synchronized biphasic 120J shock. ? ?Evaluation: ?Findings: Post procedure EKG shows: NSR ?Complications: None ?Patient did tolerate procedure well. ? ?Time Spent Directly with the Patient: ? ?30 minutes  ? ?[name] ?03/15/2022, 1:46 PM ? ? ? ? ?

## 2022-03-15 NOTE — Progress Notes (Signed)
Pt denies any needs at this time.  Resting comfortably waiting on [facility] to transport back to [facility] ?

## 2022-03-15 NOTE — Transfer of Care (Signed)
Immediate Anesthesia Transfer of Care Note ? ?Patient: [name] [name] ? ?Procedure(s) Performed: CARDIOVERSION ? ?Patient Location: Endoscopy Unit ? ?Anesthesia Type:General ? ?Level of Consciousness: awake, alert  and oriented ? ?Airway & Oxygen Therapy: Patient Spontanous Breathing ? ?Post-op Assessment: Report given to RN and Post -op Vital signs reviewed and stable ? ?Post vital signs: Reviewed and stable ? ?Last Vitals:  ?Vitals Value Taken Time  ?BP    ?Temp    ?Pulse    ?Resp    ?SpO2    ? ? ?Last Pain:  ?Vitals:  ? 03/15/22 1321  ?TempSrc: Temporal  ?PainSc: 0-No pain  ?   ? ?Patients Stated Pain Goal: 0 (03/14/22 0950) ? ?Complications: No notable events documented. ?

## 2022-03-15 NOTE — Progress Notes (Signed)
[facility] ?PROGRESS NOTE ? ? ? ?Progress Note  ?[name] [name]  [id number] DOB: 03-26-1951 DOA: 03/11/2022 ?PCP: Iona Beard, MD  ? ? ? ?Brief Narrative:  ? ?[name] [name] is an 71 y.o. female past medical history significant for atrial fibrillation on Eliquis, chronic diastolic heart failure essential hypertension, recently diagnosed with A-fib with RVR on dental office appointment recently discharged on 03/02/2022 for A-fib with RVR on Cardizem returns with increased shortness of breath at rest and with exertion was found to be in acute diastolic heart failure and A-fib with RVR cardiology has been consulted. ? ? ?Assessment/Plan:  ? ?Persistent atrial fibrillation with RVR: ?Start on IV diltiazem and oral metoprolol continue Eliquis. ?Cardiology was consulted further management per cardiology. ?Cardiology has a plan for DCCV on 03/15/2022. ? ?Acute on chronic diastolic heart failure: ?On IV Lasix negative about 1 L and a half. ?Continue strict I's and O's and daily weights.  Potassium at 3.5 we will go ahead and give her potassium orally. ?Continue beta-blockers ?Further management per cardiology. ? ?Chronic anxiety: ?Continue buspirone. ? ?Essential hypertension: ?BP stable continue to monitor her vitals closely. ?Continue current regimen. ? ? ? ? ?DVT prophylaxis: eliquis ?Family Communication:none ?Status is: Inpatient ?Remains inpatient appropriate because: A-fib with RVR ? ? ? ?Code Status:  ? ?  ?Code Status Orders  ?(From admission, onward)  ?  ? ? ?  ? ?  Start     Ordered  ? 03/12/22 1350  Full code  Continuous       ? 03/12/22 1349  ? ?  ?  ? ?  ? ?Code Status History   ? ? Date Active Date Inactive Code Status Order ID Comments User Context  ? 02/28/2022 2326 03/02/2022 2357 Full Code [id number]  Rhetta Mura, DO Inpatient  ? 08/16/2016 1543 08/17/2016 1801 Full Code [id number]  Mcarthur Rossetti, MD Inpatient  ? ?  ? ? ? ? ?IV Access:  ? ?Peripheral IV ? ? ?Procedures and diagnostic studies:   ? ?No results found. ? ? ?Medical Consultants:  ? ?None. ? ? ?Subjective:  ? ? ?[name] wants to go home. ? ?Objective:  ? ? ?Vitals:  ? 03/14/22 2309 03/15/22 0223 03/15/22 0700 03/15/22 0744  ?BP: 104/80 92/72 99/78    ?Pulse: 81 74 75   ?Resp: 18 20 (!) 22   ?Temp: 98.4 ?F (36.9 ?C) 98 ?F (36.7 ?C) 98.1 ?F (36.7 ?C)   ?TempSrc: Oral Oral Oral   ?SpO2: 98% 97% 97%   ?Weight:    98.7 kg  ?Height:      ? ?SpO2: 97 % ?O2 Flow Rate (L/min): 1 L/min ? ? ?Intake/Output Summary (Last 24 hours) at 03/15/2022 0940 ?Last data filed at 03/15/2022 0700 ?Gross per 24 hour  ?Intake 456.08 ml  ?Output 1200 ml  ?Net -743.92 ml  ? ? ?Filed Weights  ? 03/13/22 0500 03/14/22 1830 03/15/22 0744  ?Weight: 103 kg 100.6 kg 98.7 kg  ? ? ?Exam: ?General exam: In no acute distress. ?Respiratory system: Good air movement and clear to auscultation. ?Cardiovascular system: S1 & S2 heard, RRR. No JVD. ?Gastrointestinal system: Abdomen is nondistended, soft and nontender.  ?Extremities: No pedal edema. ?Skin: No rashes, lesions or ulcers ?Psychiatry: Judgement and insight appear normal. Mood & affect appropriate. ? ? ?Data Reviewed:  ? ? ?Labs: ?Basic Metabolic Panel: ?Recent Labs  ?Lab 03/11/22 ?1508 03/11/22 ?2311 03/12/22 ?0109 03/14/22 ?0424  ?NA 141 142 142 139  ?K 4.2  3.4* 3.6 3.5  ?CL 106 105 107 101  ?CO2 27 27 29  32  ?GLUCOSE 94 149* 110* 108*  ?BUN 10 13 13 19   ?CREATININE 0.83 0.93 0.91 0.93  ?CALCIUM 9.0 8.6* 8.4* 8.6*  ?MG 1.9 2.0  --  1.9  ?PHOS  --   --   --  4.5  ? ? ?GFR ?Estimated Creatinine Clearance: 64.2 mL/min (by C-G formula based on SCr of 0.93 mg/dL). ?Liver Function Tests: ?Recent Labs  ?Lab 03/11/22 ?1508 03/11/22 ?2311  ?AST 32 30  ?ALT 65* 62*  ?ALKPHOS 44 46  ?BILITOT 0.7 0.5  ?PROT 6.8 6.6  ?ALBUMIN 4.0 3.8  ? ? ?No results for input(s): LIPASE, AMYLASE in the last 168 hours. ?No results for input(s): AMMONIA in the last 168 hours. ?Coagulation profile ?No results for input(s): INR, PROTIME in the last 168  hours. ?COVID-19 Labs ? ?No results for input(s): DDIMER, FERRITIN, LDH, CRP in the last 72 hours. ? ?Lab Results  ?Component Value Date  ? [place] NEGATIVE 03/11/2022  ? ? ?CBC: ?Recent Labs  ?Lab 03/11/22 ?1508 03/12/22 ?0109 03/14/22 ?0424  ?WBC 5.8 7.0 6.8  ?NEUTROABS 3.3  --   --   ?HGB 12.8 12.5 12.3  ?HCT 38.5 37.2 36.9  ?MCV 100.0 101.6* 101.7*  ?PLT 164 179 160  ? ? ?Cardiac Enzymes: ?No results for input(s): CKTOTAL, CKMB, CKMBINDEX, TROPONINI in the last 168 hours. ?BNP (last 3 results) ?No results for input(s): PROBNP in the last 8760 hours. ?CBG: ?No results for input(s): GLUCAP in the last 168 hours. ?D-Dimer: ?No results for input(s): DDIMER in the last 72 hours. ?Hgb A1c: ?No results for input(s): HGBA1C in the last 72 hours. ?Lipid Profile: ?No results for input(s): CHOL, HDL, LDLCALC, TRIG, CHOLHDL, LDLDIRECT in the last 72 hours. ?Thyroid function studies: ?No results for input(s): TSH, T4TOTAL, T3FREE, THYROIDAB in the last 72 hours. ? ?Invalid input(s): FREET3 ? ?Anemia work up: ?No results for input(s): VITAMINB12, FOLATE, FERRITIN, TIBC, IRON, RETICCTPCT in the last 72 hours. ?Sepsis Labs: ?Recent Labs  ?Lab 03/11/22 ?1508 03/12/22 ?0109 03/14/22 ?0424  ?WBC 5.8 7.0 6.8  ? ? ?Microbiology ?Recent Results (from the past 240 hour(s))  ?Resp Panel by RT-PCR (Flu A&B, Covid) Nasopharyngeal Swab     Status: None  ? Collection Time: 03/11/22  5:26 PM  ? Specimen: Nasopharyngeal Swab; Nasopharyngeal(NP) swabs in vial transport medium  ?Result Value Ref Range Status  ? SARS Coronavirus 2 by RT PCR NEGATIVE NEGATIVE Final  ?  Comment: (NOTE) ?SARS-CoV-2 target nucleic acids are NOT DETECTED. ? ?The SARS-CoV-2 RNA is generally detectable in upper respiratory ?specimens during the acute phase of infection. The lowest ?concentration of SARS-CoV-2 viral copies this assay can detect is ?138 copies/mL. A negative result does not preclude SARS-Cov-2 ?infection and should not be used as the sole basis for  treatment or ?other patient management decisions. A negative result may occur with  ?improper specimen collection/handling, submission of specimen other ?than nasopharyngeal swab, presence of viral mutation(s) within the ?areas targeted by this assay, and inadequate number of viral ?copies(<138 copies/mL). A negative result must be combined with ?clinical observations, patient history, and epidemiological ?information. The expected result is Negative. ? ?Fact Sheet for Patients:  ?EntrepreneurPulse.com.au ? ?Fact Sheet for Healthcare Providers:  ?IncredibleEmployment.be ? ?This test is no t yet approved or cleared by the [place] FDA and  ?has been authorized for detection and/or diagnosis of SARS-CoV-2 by ?FDA under an Emergency Use Authorization (EUA). This  EUA will remain  ?in effect (meaning this test can be used) for the duration of the ?COVID-19 declaration under Section 564(b)(1) of the Act, 21 ?U.S.C.section 360bbb-3(b)(1), unless the authorization is terminated  ?or revoked sooner.  ? ? ?  ? Influenza A by PCR NEGATIVE NEGATIVE Final  ? Influenza B by PCR NEGATIVE NEGATIVE Final  ?  Comment: (NOTE) ?The Xpert Xpress SARS-CoV-2/FLU/RSV plus assay is intended as an aid ?in the diagnosis of influenza from Nasopharyngeal swab specimens and ?should not be used as a sole basis for treatment. Nasal washings and ?aspirates are unacceptable for Xpert Xpress SARS-CoV-2/FLU/RSV ?testing. ? ?Fact Sheet for Patients: ?EntrepreneurPulse.com.au ? ?Fact Sheet for Healthcare Providers: ?IncredibleEmployment.be ? ?This test is not yet approved or cleared by the [place] FDA and ?has been authorized for detection and/or diagnosis of SARS-CoV-2 by ?FDA under an Emergency Use Authorization (EUA). This EUA will remain ?in effect (meaning this test can be used) for the duration of the ?COVID-19 declaration under Section 564(b)(1) of the Act, 21  U.S.C. ?section 360bbb-3(b)(1), unless the authorization is terminated or ?revoked. ? ?Performed at [facility], [address] ?[place], [place] [address] ?  ? ? ? ?Medications:  ? ? apixaban  5 m

## 2022-03-15 NOTE — Progress Notes (Signed)
? ?Cardiology Clinic Note  ? ?Patient Name: [name] [name] ?Date of Encounter: 03/19/2022 ? ?Primary Care Provider:  Iona Beard, MD ?Primary Cardiologist:  Pixie Casino, MD ? ?Patient Profile  ?  ?[name] [name] 71 year old female presents the clinic today for follow-up evaluation of her paroxysmal atrial fibrillation and hypertension. ? ?Past Medical History  ?  ?Past Medical History:  ?Diagnosis Date  ? A-fib ([place])   ? Anemia   ? during pneumonia  ? Arthritis   ? Asthma   ? Hypertension   ? ?Past Surgical History:  ?Procedure Laterality Date  ? c section    ? 1973 and 1983  ? COLONOSCOPY    ? ORIF ANKLE FRACTURE Left 08/16/2016  ? ORIF ANKLE FRACTURE Left 08/16/2016  ? Procedure: OPEN REDUCTION INTERNAL FIXATION (ORIF) LEFT BIMALLEOLAR ANKLE FRACTURE;  Surgeon: Mcarthur Rossetti, MD;  Location: [facility];  Service: Orthopedics;  Laterality: Left;  ? TUBAL LIGATION    ? ? ?Allergies ? ?No Known Allergies ? ?History of Present Illness  ?  ?[name] [name] has a PMH of paroxysmal atrial fibrillation, HTN, asthma, and long-term use of an anticoagulant. ? ?She initially noted onset of aching in her left shoulder and a fast heart rate.  She took an aspirin which did not help with her symptoms.  She contacted EMS.  On arrival she was noted to be in atrial fibrillation with RVR at a rate of 180 bpm.  She received IV diltiazem and spontaneously converted to sinus rhythm.  Although, she did mention recurrence of atrial fibrillation while in the emergency department.  She then again converted back to sinus rhythm.  Her CHA2DS2-VASc score is 2 which equals 2.2% stroke rate/year.  Her echocardiogram in the non-germinal LV function with an EF of 60 to 65%, trivial aortic insufficiency ?G2 DD. ? ?She returns in follow-up 08/31/2016 and was doing well.  However, she had sustained a left lower leg fracture and underwent surgery.  She denied any episodes of recurrent atrial fibrillation and her blood pressure was well controlled.  She  was compliant with her anticoagulation. ? ?She was again seen in follow-up 09/24/2017.  She continued to do well at that time.  She continues to wear a support brace for her ankle fracture.  She was not able to be as active as she was prior to her fracture.  She denied episodes of recurrent atrial fibrillation.  She reported compliance with her apixaban and denies bleeding issues. ? ?Follow-up 09/12/2018.  She continued to be stable from a cardiac standpoint.  She had 1 episode in December of increased heart rate and palpitations when she was upset at work.  She denied any further episodes of increased heart rate or atrial fibrillation.  She continued to be somewhat sedentary.  She reported compliance with her Eliquis.  Her blood pressure was well controlled. ? ?She followed up 09/14/2019.  During that time she continues to well.  She denied chest pain and increased shortness of breath.  She reported 1 short episode of atrial fibrillation.  Her EKG during the visit showed sinus rhythm with PACs.  She denied bleeding complications with her apixaban.  She was taking diltiazem for rate control and her blood pressure was 140/87. ? ?09/06/2020 she was again seen in follow-up.  She continued to do well from a cardiac standpoint.  She reported 1 episode of atrial fibrillation that was short-lived.  Her blood pressure was well controlled.  Her EKG at that time  showed normal sinus rhythm. ? ?She presented to the clinic 09/01/2021  for follow-up evaluation stated she felt well.  She could recall 2 episodes of atrial fibrillation over the past 12 months.  She reported that they both came on with increased periods of stress related to her ex-husband.  They lasted about 10 minutes or less and then dissipated on their own without intervention.  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  She walked daily for about 10 to 15 minutes.  She reported compliance with her medications.  We reviewed  precautions for anticoagulation.  She expressed understanding. I refilled her diltiazem and apixaban.  I  gave her the salty 6 diet sheet, had her increase her physical activity  and planned follow-up for 1 year. ? ?She was admitted to the hospital 03/11/22 and discharged on 03/16/2022.  She was seen by Dr. Radford Pax prior to discharge on 03/16/2022.  During that time she was doing well post DCCV.  She had undergone DCCV 03/15/2022.  She remained in sinus rhythm.  Her echocardiogram 03/01/2022 showed an LVEF of [id number], diastolic parameters could not be evaluated.  She was noted to have left atrial moderate dilation, mild mitral valve regurgitation, and trivial aortic valve regurgitation. ? ?She presents to the clinic today for follow-up evaluation states she feels well today.  She has been monitoring her blood pressure and heart rate.  Her heart rate has been 50s at home.  Her blood pressures have been in the low 100s to 130s over 60s-80s.  She feels that stress at work may be contributing to her atrial fibrillation.  We discussed triggers for atrial fibrillation.  She is planning to retire in the next month or so.  We discussed her Medicare benefits.  Her weight is stable today at 221.6 pounds.  She has been taking her home supplemental potassium.  I will check a BMP today.  We will move her furosemide to as needed for weight increase of 3 pounds overnight or 5 pounds in a week.  I will refill her metoprolol and plan follow-up for 3 to 4 months. ? ?Today she denies chest pain, shortness of breath, lower extremity edema, fatigue, palpitations, melena, hematuria, hemoptysis, diaphoresis, weakness, presyncope, syncope, orthopnea, and PND. ? ? ?Home Medications  ?  ?Prior to Admission medications   ?Medication Sig Start Date End Date Taking? Authorizing Provider  ?acetaminophen (TYLENOL) 500 MG tablet Take 1,000 mg by mouth every 6 (six) hours as needed for mild pain.    [provider]  ?albuterol (PROVENTIL  HFA;VENTOLIN HFA) 108 (90 Base) MCG/ACT inhaler Inhale 2 puffs into the lungs every 6 (six) hours as needed for wheezing or shortness of breath.     [provider]  ?apixaban (ELIQUIS) 5 MG TABS tablet Take 1 tablet (5 mg total) by mouth 2 (two) times daily. 09/06/20   Hilty, Nadean Corwin, MD  ?B Complex-C (B-COMPLEX WITH VITAMIN C) tablet Take 1 tablet by mouth daily.    [provider]  ?busPIRone (BUSPAR) 5 MG tablet Take 5 mg by mouth 2 (two) times daily.     [provider]  ?cholecalciferol (VITAMIN D) 1000 units tablet Take 2,000 Units by mouth daily.    [provider]  ?diltiazem (CARTIA XT) 120 MG 24 hr capsule Take 1 capsule (120 mg total) by mouth daily. 09/06/20   Hilty, Nadean Corwin, MD  ?Multiple Vitamin (MULTIVITAMIN WITH MINERALS) TABS tablet Take 1 tablet by mouth  daily.    [provider]  ?telmisartan-hydrochlorothiazide (MICARDIS HCT) 80-25 MG tablet Take 1 tablet by mouth daily.    [provider]  ? ? ?Family History  ?  ?Family History  ?Problem Relation Age of Onset  ? Atrial fibrillation Mother   ? Hypertension Mother   ? Asthma Father   ? Heart attack Father   ? ?She indicated that her mother is deceased. She indicated that her father is deceased. ? ? ?Social History  ?  ?Social History  ? ?Socioeconomic History  ? Marital status: Single  ?  Spouse name: Not on file  ? Number of children: Not on file  ? Years of education: Not on file  ? Highest education level: Not on file  ?Occupational History  ? Not on file  ?Tobacco Use  ? Smoking status: Never  ? Smokeless tobacco: Never  ?Vaping Use  ? Vaping Use: Never used  ?Substance and Sexual Activity  ? Alcohol use: No  ? Drug use: No  ? Sexual activity: Not Currently  ?Other Topics Concern  ? Not on file  ?Social History Narrative  ? Not on file  ? ?Social Determinants of Health  ? ?Financial Resource Strain: Not on file  ?Food Insecurity: Not on file  ?Transportation Needs: Not on file   ?Physical Activity: Not on file  ?Stress: Not on file  ?Social Connections: Not on file  ?Intimate Partner Violence: Not on file  ?  ? ?Review of Systems  ?  ?General:  No chills, fever, night sweats or weight changes

## 2022-03-15 NOTE — Anesthesia Preprocedure Evaluation (Addendum)
Anesthesia Evaluation  ?Patient identified by MRN, date of birth, ID band ?Patient awake ? ? ? ?Reviewed: ?Allergy & Precautions, NPO status , Patient's Chart, lab work & pertinent test results ? ?History of Anesthesia Complications ?Negative for: history of anesthetic complications ? ?Airway ?Mallampati: III ? ?TM Distance: >3 FB ?Neck ROM: Full ? ? ? Dental ? ?(+) Dental Advisory Given ?  ?Pulmonary ?asthma ,  ?  ?Pulmonary exam normal ? ? ? ? ? ? ? Cardiovascular ?hypertension, +CHF  ?+ dysrhythmias Atrial Fibrillation  ?Rhythm:Irregular Rate:Normal ? ? ?'23 TTE - EF 60 to 65%. There is mild left ventricular hypertrophy. There is mildly elevated pulmonary artery systolic pressure. The estimated right ventricular systolic pressure is 36.3 mmHg. Left atrial size was moderately dilated. Mild mitral valve regurgitation. Aortic valve regurgitation is trivial.  ? ?  ?Neuro/Psych ?negative neurological ROS ? negative psych ROS  ? GI/Hepatic ?negative GI ROS, Neg liver ROS,   ?Endo/Other  ? ?Obesity ? ? Renal/GU ?negative Renal ROS  ? ?  ?Musculoskeletal ? ?(+) Arthritis ,  ? Abdominal ?  ?Peds ? Hematology ? ?On eliquis ?   ?Anesthesia Other Findings ? ? Reproductive/Obstetrics ? ?  ? ? ? ? ? ? ? ? ? ? ? ? ? ?  ?  ? ? ? ? ? ? ? ?Anesthesia Physical ?Anesthesia Plan ? ?ASA: 3 ? ?Anesthesia Plan: General  ? ?Post-op Pain Management: Minimal or no pain anticipated  ? ?Induction: Intravenous ? ?PONV Risk Score and Plan: 3 and Treatment may vary due to age or medical condition and Propofol infusion ? ?Airway Management Planned: Natural Airway and Mask ? ?Additional Equipment: None ? ?Intra-op Plan:  ? ?Post-operative Plan:  ? ?Informed Consent: I have reviewed the patients History and Physical, chart, labs and discussed the procedure including the risks, benefits and alternatives for the proposed anesthesia with the patient or authorized representative who has indicated his/her understanding  and acceptance.  ? ? ? ? ? ?Plan Discussed with: CRNA and Anesthesiologist ? ?Anesthesia Plan Comments:   ? ? ? ? ? ?Anesthesia Quick Evaluation ? ?

## 2022-03-15 NOTE — Progress Notes (Signed)
[facility] notified that pt is ready to be transferred back to [facility] ?

## 2022-03-15 NOTE — Interval H&P Note (Signed)
History and Physical Interval Note: ? ?03/15/2022 ?1:34 PM ? ?[name] [name]  has presented today for surgery, with the diagnosis of AFIB.  The various methods of treatment have been discussed with the patient and family. After consideration of risks, benefits and other options for treatment, the patient has consented to  Procedure(s): ?CARDIOVERSION (N/A) as a surgical intervention.  The patient's history has been reviewed, patient examined, no change in status, stable for surgery.  I have reviewed the patient's chart and labs.  Questions were answered to the patient's satisfaction.   ? ? ?[name] ? ? ?

## 2022-03-15 NOTE — Anesthesia Postprocedure Evaluation (Signed)
Anesthesia Post Note ? ?Patient: [name] [name] ? ?Procedure(s) Performed: CARDIOVERSION ? ?  ? ?Patient location during evaluation: PACU ?Anesthesia Type: General ?Level of consciousness: awake and alert ?Pain management: pain level controlled ?Vital Signs Assessment: post-procedure vital signs reviewed and stable ?Respiratory status: spontaneous breathing, nonlabored ventilation and respiratory function stable ?Cardiovascular status: stable and blood pressure returned to baseline ?Anesthetic complications: no ? ? ?No notable events documented. ? ?Last Vitals:  ?Vitals:  ? 03/15/22 1420 03/15/22 1437  ?BP: 99/66 (!) 93/58  ?Pulse: (!) 59 (!) 58  ?Resp: 16 12  ?Temp:    ?SpO2: 97% 99%  ?  ?Last Pain:  ?Vitals:  ? 03/15/22 1401  ?TempSrc:   ?PainSc: 0-No pain  ? ? ?  ?  ?  ?  ?  ?  ? ?Beryle Lathe ? ? ? ? ?

## 2022-03-15 NOTE — Progress Notes (Addendum)
? ?Progress Note ? ?Patient Name: [name] [name] ?Date of Encounter: 03/15/2022 ? ?[facility] Cardiologist: Pixie Casino, MD  ? ?Subjective  ? ?Doing well today denies any chest pain, shortness of breath or palpitations.  She is n.p.o. for cardioversion later today. ? ?Inpatient Medications  ?  ?Scheduled Meds: ? apixaban  5 mg Oral BID  ? busPIRone  5 mg Oral BID  ? cholecalciferol  2,000 Units Oral Daily  ? furosemide  40 mg Intravenous BID  ? mouth rinse  15 mL Mouth Rinse BID  ? metoprolol tartrate  25 mg Oral BID  ? multivitamin with minerals  1 tablet Oral Daily  ? potassium chloride  40 mEq Oral BID  ? potassium chloride  20 mEq Oral Daily  ? ?Continuous Infusions: ? diltiazem (CARDIZEM) infusion 10 mg/hr (03/15/22 1050)  ? ?PRN Meds: ?acetaminophen, levalbuterol  ? ?Vital Signs  ?  ?Vitals:  ? 03/14/22 2309 03/15/22 0223 03/15/22 0700 03/15/22 0744  ?BP: 104/80 92/72 99/78    ?Pulse: 81 74 75   ?Resp: 18 20 (!) 22   ?Temp: 98.4 ?F (36.9 ?C) 98 ?F (36.7 ?C) 98.1 ?F (36.7 ?C)   ?TempSrc: Oral Oral Oral   ?SpO2: 98% 97% 97%   ?Weight:    98.7 kg  ?Height:      ? ? ?Intake/Output Summary (Last 24 hours) at 03/15/2022 1107 ?Last data filed at 03/15/2022 0700 ?Gross per 24 hour  ?Intake 456.08 ml  ?Output 1200 ml  ?Net -743.92 ml  ? ? ? ?  03/15/2022  ?  7:44 AM 03/14/2022  ?  6:30 PM 03/13/2022  ?  5:00 AM  ?Last 3 Weights  ?Weight (lbs) 217 lb 9.6 oz 221 lb 12.8 oz 227 lb 1.6 oz  ?Weight (kg) 98.703 kg 100.608 kg 103.012 kg  ?   ? ?Telemetry  ?  ?Atrial fibrillation with controlled ventricular response personally Reviewed ? ?ECG  ?  ?No new EKG to review - Personally Reviewed ? ?Physical Exam  ? ?GEN: Well nourished, well developed in no acute distress ?HEENT: Normal ?NECK: No JVD; No carotid bruits ?LYMPHATICS: No lymphadenopathy ?CARDIAC: Irregularly irregular, no murmurs, rubs, gallops ?RESPIRATORY:  Clear to auscultation without rales, wheezing or rhonchi  ?ABDOMEN: Soft, non-tender,  non-distended ?MUSCULOSKELETAL:  No edema; No deformity  ?SKIN: Warm and dry ?NEUROLOGIC:  Alert and oriented x 3 ?PSYCHIATRIC:  Normal affect   ?Labs  ?  ?High Sensitivity Troponin:   ?Recent Labs  ?Lab 02/28/22 ?2148 03/11/22 ?1508 03/11/22 ?1708 03/11/22 ?2311 03/12/22 ?0109  ?TROPONINIHS 3 3 3 3 3   ? ?   ? ?Chemistry ?Recent Labs  ?Lab 03/11/22 ?1508 03/11/22 ?2311 03/12/22 ?0109 03/14/22 ?0424  ?NA 141 142 142 139  ?K 4.2 3.4* 3.6 3.5  ?CL 106 105 107 101  ?CO2 27 27 29  32  ?GLUCOSE 94 149* 110* 108*  ?BUN 10 13 13 19   ?CREATININE 0.83 0.93 0.91 0.93  ?CALCIUM 9.0 8.6* 8.4* 8.6*  ?PROT 6.8 6.6  --   --   ?ALBUMIN 4.0 3.8  --   --   ?AST 32 30  --   --   ?ALT 65* 62*  --   --   ?ALKPHOS 44 46  --   --   ?BILITOT 0.7 0.5  --   --   ?GFRNONAA >60 >60 >60 >60  ?ANIONGAP 8 10 6 6   ? ?  ? ?Hematology ?Recent Labs  ?Lab 03/11/22 ?1508 03/12/22 ?0109 03/14/22 ?0424  ?  WBC 5.8 7.0 6.8  ?RBC 3.85* 3.66* 3.63*  ?HGB 12.8 12.5 12.3  ?HCT 38.5 37.2 36.9  ?MCV 100.0 101.6* 101.7*  ?MCH 33.2 34.2* 33.9  ?MCHC 33.2 33.6 33.3  ?RDW 12.9 12.9 12.9  ?PLT 164 179 160  ? ? ? ?BNP ?Recent Labs  ?Lab 03/11/22 ?1508  ?BNP 297.3*  ? ?  ? ?DDimer No results for input(s): DDIMER in the last 168 hours. ? ? ?CHA2DS2-VASc Score = 4  ?This indicates a 4.8% annual risk of stroke. ?The patient's score is based upon: ?CHF History: 1 ?HTN History: 1 ?Diabetes History: 0 ?Stroke History: 0 ?Vascular Disease History: 0 ?Age Score: 1 ?Gender Score: 1 ? ?Radiology  ?  ?No results found. ? ?Cardiac Studies  ? ?2D echo 03/01/2022 ?IMPRESSIONS  ? ? 1. Left ventricular ejection fraction, by estimation, is 60 to 65%. Left  ?ventricular ejection fraction by 2D MOD biplane is 61.6 %. The left  ?ventricle has normal function. The left ventricle has no regional wall  ?motion abnormalities. There is mild left  ?ventricular hypertrophy. Left ventricular diastolic function could not be  ?evaluated.  ? 2. Right ventricular systolic function is normal. The right  ventricular  ?size is normal. There is mildly elevated pulmonary artery systolic  ?pressure. The estimated right ventricular systolic pressure is 36.3 mmHg.  ? 3. Left atrial size was moderately dilated.  ? 4. The mitral valve is grossly normal. Mild mitral valve regurgitation.  ? 5. The aortic valve is tricuspid. Aortic valve regurgitation is trivial.  ?Aortic valve sclerosis is present, with no evidence of aortic valve  ?stenosis. Aortic regurgitation PHT measures 518 msec.  ? 6. The inferior vena cava is dilated in size with >50% respiratory  ?variability, suggesting right atrial pressure of 8 mmHg.  ? ?Patient Profile  ?   ?[name], chronic anticoagulation, [name], HTN, asthma,  who is being seen 03/12/2022 for the evaluation of SOB at the request of Dr. Hall. ? ?Assessment & Plan  ?  ?Atrial fibrillation with RVR ?-She was hospitalized 2 weeks ago with recurrent atrial fibrillation and medications were adjusted for rate control and she was to follow-up with Dr. Hilty to discuss possible outpatient elective cardioversion but this has not occurred yet ?-Readmitted with increased abdominal fullness and symptoms of volume overload and found to have heart rate of 118 bpm and was started on BB but heart rate went up into the 140s but her home dose of Cardizem had not been started ?-2D echo 03/01/2022 showed normal LV function with EF 60 to 65% and moderate left atrial enlargement. ?-She has not missed any doses of her Eliquis>>I reviewed her recent admission a few weeks ago and she took her Eliquis twice the day of admission and received it BID while hospitalized and took her PM dose when she got home.  On this admission she had taken her am dose and [facility] and was given her PM dose last on 4/16 in ER and has received all doses BID since then and states and has not missed any doses at home ?-She is n.p.o. for planned cardioversion today ?-Currently on Cardizem drip which will  continue until her cardioversion is done this afternoon and then stop.  -Continue apixaban 5 mg twice daily, Lopressor 25 mg twice daily and consolidate to Toprol-XL 50 mg daily if BP remains stable after cardioversion ?-Final decision on if she goes home Cardizem will be based   on what her heart rate and blood pressure are doing after cardioversion ?  ?Acute on chronic diastolic CHF ?-Suspect related to ongoing atrial fibrillation with poorly controlled heart rate ?-BNP was elevated at 297 on admission with vascular congestion on chest x-ray ?-She is now on IV Lasix and is put out 1.2 L yesterday and is net - 1.59 since admission ?-Weight is down 5 pounds from admission ?-SCr 0.91 and K+ 3.6 yesterday  ?-Check bmet today before cardioversion since we have been diuresing her ?-I think she is near euvolemia so we will change Lasix to 20 mg daily p.o. since I think her volume overload was brought on mainly by atrial fibrillation with RVR ?  ?Hypertension ?-BP on the soft side at 99/78 mmHg ?-Continue Lopressor 25 mg twice daily and consolidate to Toprol-XL 50 mg daily after cardioversion if BP is stable ? ? ?If she remains stable after DC cardioversion and blood pressures normalized I think she will be fine to be discharged home today ? ?Risk Assessment/Risk Scores:  ?    :[id number]   ? ?New York Heart Association (NYHA) Functional Class ?NYHA Class II ?  ?CHA2DS2-VASc Score = 4  ?{This indicates a 4.8% annual risk of stroke. ?The patient's score is based upon: ?CHF History: 1 ?HTN History: 1 ?Diabetes History: 0 ?Stroke History: 0 ?Vascular Disease History: 0 ?Age Score: 1 ?Gender Score: 1 ?   ? ?For questions or updates, please contact [place] ?Please consult [email or web address] for contact info under  ? ?  ?   ?Signed, ?Fransico Him, MD  ?03/15/2022, 11:07 AM    ?

## 2022-03-16 DIAGNOSIS — I480 Paroxysmal atrial fibrillation: Secondary | ICD-10-CM | POA: Diagnosis not present

## 2022-03-16 DIAGNOSIS — I4891 Unspecified atrial fibrillation: Secondary | ICD-10-CM | POA: Diagnosis not present

## 2022-03-16 DIAGNOSIS — I5033 Acute on chronic diastolic (congestive) heart failure: Secondary | ICD-10-CM | POA: Diagnosis not present

## 2022-03-16 LAB — BASIC METABOLIC PANEL
Anion gap: 8 (ref 5–15)
BUN: 17 mg/dL (ref 8–23)
CO2: 29 mmol/L (ref 22–32)
Calcium: 9 mg/dL (ref 8.9–10.3)
Chloride: 100 mmol/L (ref 98–111)
Creatinine, Ser: 0.73 mg/dL (ref 0.44–1.00)
GFR, Estimated: >60 mL/min (ref >60)
Glucose, Bld: 108 mg/dL — ABNORMAL HIGH (ref 70–99)
Potassium: 4.1 mmol/L (ref 3.5–5.1)
Sodium: 137 mmol/L (ref 135–145)

## 2022-03-16 MED ORDER — FUROSEMIDE 20 MG PO TABS
20.0000 mg | ORAL_TABLET | Freq: Every day | ORAL | 0 refills | Status: DC
Start: 2022-03-16 — End: 2022-03-19

## 2022-03-16 MED ORDER — METOPROLOL TARTRATE 25 MG PO TABS: 25.0000 mg | ORAL_TABLET | Freq: Two times a day (BID) | ORAL | 1 refills | Status: DC

## 2022-03-16 NOTE — Discharge Summary (Signed)
Physician Discharge Summary  ?SHAINAH TALWAR [id number] DOB: [birth year] DOA: 03/11/2022 ? ?PCP: Iona Beard, MD ? ?Admit date: 03/11/2022 ?Discharge date: 03/16/2022 ? ?Admitted From: Home ?Disposition:  Home ? ?Recommendations for Outpatient Follow-up:  ?Follow up with Cards in 1-2 weeks ?Please obtain BMP/CBC in one week ? ? ?Home Health:No ?Equipment/Devices:None ?Discharge Condition:Stable ?CODE STATUS:fULL ?Diet recommendation: Heart Healthy ? ?Brief/Interim Summary: ?71 y.o. female past medical history significant for atrial fibrillation on Eliquis, chronic diastolic heart failure essential hypertension, recently diagnosed with A-fib with RVR on dental office appointment recently discharged on 03/02/2022 for A-fib with RVR on Cardizem returns with increased shortness of breath at rest and with exertion was found to be in acute diastolic heart failure and A-fib with RVR cardiology has been consulted. ? ?Discharge Diagnoses:  ?Principal Problem: ?  Acute exacerbation of CHF (congestive heart failure) ([place]) ?Active Problems: ?  A-fib [facility]) ?  Atrial fibrillation with RVR ([place]) ? ?Persistent atrial fibrillation with RVR: ?Started on IV diltiazem and oral metoprolol discontinue Eliquis cardiology was consulted recommended [facility] cardioversion on 03/15/2022 remain in sinus rhythm she will keep home on Eliquis and metoprolol. ? ?Acute on chronic diastolic heart failure: ?She received 2 doses of IV Lasix negative about a liter. ?Her potassium was repleted orally. ?She was continued Toprol with follow-up with cardiology in 1 week. ? ?Chronic anxiety: ?Continue BuSpar. ? ?Essential hypertension: ?No change made to her medication. ? ?Discharge Instructions ? ?Discharge Instructions   ? ? Amb referral to [facility] Clinic   Complete by: As directed ?  ? Diet - low sodium heart healthy   Complete by: As directed ?  ? Increase activity slowly   Complete by: As directed ?  ? ?  ? ?Allergies as of 03/16/2022   ?No Known Allergies ?  ? ?   ?Medication List  ?  ? ?TAKE these medications   ? ?acetaminophen 500 MG tablet ?Commonly known as: TYLENOL ?Take 1,000 mg by mouth every 6 (six) hours as needed for mild pain. ?  ?busPIRone 5 MG tablet ?Commonly known as: BUSPAR ?Take 5 mg by mouth 2 (two) times daily. ?  ?cholecalciferol 1000 units tablet ?Commonly known as: VITAMIN D ?Take 2,000 Units by mouth daily. ?  ?diltiazem 180 MG 24 hr capsule ?Commonly known as: CARDIZEM CD ?Take 1 capsule (180 mg total) by mouth daily. ?  ?Eliquis 5 MG Tabs tablet ?Generic drug: apixaban ?Take 1 tablet by mouth twice daily ?What changed: how much to take ?  ?furosemide 20 MG tablet ?Commonly known as: LASIX ?Take 1 tablet (20 mg total) by mouth daily. ?  ?metoprolol tartrate 25 MG tablet ?Commonly known as: LOPRESSOR ?Take 1 tablet (25 mg total) by mouth 2 (two) times daily. ?  ?multivitamin with minerals Tabs tablet ?Take 1 tablet by mouth daily. ?  ? ?  ? ? ?No Known Allergies ? ?Consultations: ?Cardiology ? ? ?Procedures/Studies: ?DG Chest Portable 1 View ? ?Result Date: 03/11/2022 ?CLINICAL DATA:  Shortness of breath EXAM: PORTABLE CHEST 1 VIEW COMPARISON:  Chest x-ray 03/01/2022 FINDINGS: Cardiomegaly and mediastinum appear unchanged. Mildly increased pulmonary vascular prominence since previous study. No focal consolidation identified. No large pleural effusion. No pneumothorax. IMPRESSION: Cardiomegaly with mild pulmonary vascular congestion. Electronically Signed   By: Ofilia Neas M.D.   On: 03/11/2022 15:45  ? ?DG Chest Port 1 View ? ?Result Date: 03/01/2022 ?CLINICAL DATA:  Atrial fibrillation EXAM: PORTABLE CHEST 1 VIEW COMPARISON:  02/11/2016 FINDINGS: Heart is normal size. Scarring at  the left base. Right lung clear. No effusions or acute bony abnormality. IMPRESSION: Left base scarring.  No active disease. Electronically Signed   By: Rolm Baptise M.D.   On: 03/01/2022 01:48  ? ?ECHOCARDIOGRAM COMPLETE ? ?Result Date: 03/01/2022 ?   ECHOCARDIOGRAM REPORT    Patient Name:   [name] [name] Date of Exam: 03/01/2022 Medical Rec #:  [id number]    Height:       64.0 in Accession #:    [id number]   Weight:       224.6 lb Date of Birth:  01-18-1951    BSA:          2.055 m? Patient Age:    4 years     BP:           117/90 mmHg Patient Gender: F            HR:           66 bpm. Exam Location:  Inpatient Procedure: 2D Echo, Cardiac Doppler and Color Doppler Indications:    Afib  History:        Patient has no prior history of Echocardiogram examinations.                 CHF, Arrythmias:Atrial Fibrillation; Risk Factors:Hypertension.  Sonographer:    Jyl Heinz Referring Phys: [id number] [place]  1. Left ventricular ejection fraction, by estimation, is 60 to 65%. Left ventricular ejection fraction by 2D MOD biplane is 61.6 %. The left ventricle has normal function. The left ventricle has no regional wall motion abnormalities. There is mild left ventricular hypertrophy. Left ventricular diastolic function could not be evaluated.  2. Right ventricular systolic function is normal. The right ventricular size is normal. There is mildly elevated pulmonary artery systolic pressure. The estimated right ventricular systolic pressure is Q000111Q mmHg.  3. Left atrial size was moderately dilated.  4. The mitral valve is grossly normal. Mild mitral valve regurgitation.  5. The aortic valve is tricuspid. Aortic valve regurgitation is trivial. Aortic valve sclerosis is present, with no evidence of aortic valve stenosis. Aortic regurgitation PHT measures 518 msec.  6. The inferior vena cava is dilated in size with >50% respiratory variability, suggesting right atrial pressure of 8 mmHg. Comparison(s): No prior Echocardiogram. FINDINGS  Left Ventricle: Left ventricular ejection fraction, by estimation, is 60 to 65%. Left ventricular ejection fraction by 2D MOD biplane is 61.6 %. The left ventricle has normal function. The left ventricle has no regional wall motion abnormalities. The  left ventricular internal cavity size was normal in size. There is mild left ventricular hypertrophy. Left ventricular diastolic function could not be evaluated due to atrial fibrillation. Left ventricular diastolic function could not be evaluated. Right Ventricle: The right ventricular size is normal. No increase in right ventricular wall thickness. Right ventricular systolic function is normal. There is mildly elevated pulmonary artery systolic pressure. The tricuspid regurgitant velocity is 2.66  m/s, and with an assumed right atrial pressure of 8 mmHg, the estimated right ventricular systolic pressure is Q000111Q mmHg. Left Atrium: Left atrial size was moderately dilated. Right Atrium: Right atrial size was normal in size. Pericardium: There is no evidence of pericardial effusion. Mitral Valve: The mitral valve is grossly normal. Mild mitral valve regurgitation. Tricuspid Valve: The tricuspid valve is grossly normal. Tricuspid valve regurgitation is mild. Aortic Valve: The aortic valve is tricuspid. Aortic valve regurgitation is trivial. Aortic regurgitation PHT measures 518 msec. Aortic valve sclerosis is present, with  no evidence of aortic valve stenosis. Aortic valve peak gradient measures 6.1 mmHg. Pulmonic Valve: The pulmonic valve was grossly normal. Pulmonic valve regurgitation is trivial. Aorta: The aortic root and ascending aorta are structurally normal, with no evidence of dilitation. Venous: The inferior vena cava is dilated in size with greater than 50% respiratory variability, suggesting right atrial pressure of 8 mmHg. IAS/Shunts: There is right bowing of the interatrial septum, suggestive of elevated left atrial pressure. No atrial level shunt detected by color flow Doppler.  LEFT VENTRICLE PLAX 2D                        Biplane EF (MOD) LVIDd:         4.10 cm         LV Biplane EF:   Left LVIDs:         2.60 cm                          ventricular LV PW:         1.00 cm                           ejection LV IVS:        1.30 cm                          fraction by LVOT diam:     2.20 cm                          2D MOD LV SV:         71                               biplane is LV SV Index:   35

## 2022-03-16 NOTE — Progress Notes (Signed)
? ?Progress Note ? ?Patient Name: [name] [name] ?Date of Encounter: 03/16/2022 ? ?[facility] Cardiologist: Pixie Casino, MD  ? ?Subjective  ? ?Doing well status post DCCV yesterday to normal sinus rhythm.  She remains in sinus rhythm on telemetry today ?Inpatient Medications  ?  ?Scheduled Meds: ? apixaban  5 mg Oral BID  ? busPIRone  5 mg Oral BID  ? cholecalciferol  2,000 Units Oral Daily  ? furosemide  20 mg Oral Daily  ? mouth rinse  15 mL Mouth Rinse BID  ? metoprolol tartrate  25 mg Oral BID  ? multivitamin with minerals  1 tablet Oral Daily  ? potassium chloride  20 mEq Oral Daily  ? potassium chloride  40 mEq Oral BID  ? ?Continuous Infusions: ? ? ?PRN Meds: ?acetaminophen, levalbuterol  ? ?Vital Signs  ?  ?Vitals:  ? 03/15/22 2130 03/16/22 0500 03/16/22 0524 03/16/22 0529  ?BP: 106/76  112/83 111/84  ?Pulse: (!) 59  71 71  ?Resp: _0 ?Temp: 98.5 ?F (36.9 ?C)  98.7 ?F (37.1 ?C) 99.6 ?F (37.6 ?C)  ?TempSrc: Oral  Oral Oral  ?SpO2: 96%  94% 97%  ?Weight:  100 kg    ?Height:      ? ? ?Intake/Output Summary (Last 24 hours) at 03/16/2022 0928 ?Last data filed at 03/15/2022 1350 ?Gross per 24 hour  ?Intake 200 ml  ?Output 0 ml  ?Net 200 ml  ? ? ? ?  03/16/2022  ?  5:00 AM 03/15/2022  ?  1:21 PM 03/15/2022  ?  7:44 AM  ?Last 3 Weights  ?Weight (lbs) 220 lb 8 oz 217 lb 9.5 oz 217 lb 9.6 oz  ?Weight (kg) 100.018 kg 98.7 kg 98.703 kg  ?   ? ?Telemetry  ?  ?Atrial fibrillation with controlled ventricular response personally Reviewed ? ?ECG  ?  ?No new EKG to review - Personally Reviewed ? ?Physical Exam  ? ?GEN: Well nourished, well developed in no acute distress ?HEENT: Normal ?NECK: No JVD; No carotid bruits ?LYMPHATICS: No lymphadenopathy ?CARDIAC:RRR, no murmurs, rubs, gallops ?RESPIRATORY:  Clear to auscultation without rales, wheezing or rhonchi  ?ABDOMEN: Soft, non-tender, non-distended ?MUSCULOSKELETAL:  No edema; No deformity  ?SKIN: Warm and dry ?NEUROLOGIC:  Alert and oriented x 3 ?PSYCHIATRIC:   Normal affect   ?Labs  ?  ?High Sensitivity Troponin:   ?Recent Labs  ?Lab 02/28/22 ?2148 03/11/22 ?1508 03/11/22 ?1708 03/11/22 ?2311 03/12/22 ?0109  ?TROPONINIHS _1 ? ?   ? ?Chemistry ?Recent Labs  ?Lab 03/11/22 ?1508 03/11/22 ?2311 03/12/22 ?0109 03/14/22 ?0424 03/15/22 ?1133  ?NA 141 142 142 139 137  ?K 4.2 3.4* 3.6 3.5 3.5  ?CL 106 105 107 101 99  ?CO2 _2 32 30  ?GLUCOSE 94 149* 110* 108* 121*  ?BUN _3 ?CREATININE 0.83 0.93 0.91 0.93 0.79  ?CALCIUM 9.0 8.6* 8.4* 8.6* 8.8*  ?PROT 6.8 6.6  --   --   --   ?ALBUMIN 4.0 3.8  --   --   --   ?AST 32 30  --   --   --   ?ALT 65* 62*  --   --   --   ?ALKPHOS 44 46  --   --   --   ?BILITOT 0.7 0.5  --   --   --   ?GFRNONAA >60 >60 >60 >60 >60  ?ANIONGAP _4 6  8  ? ?  ? ?Hematology ?Recent Labs  ?Lab 03/11/22 ?1508 03/12/22 ?0109 03/14/22 ?0424  ?WBC 5.8 7.0 6.8  ?RBC 3.85* 3.66* 3.63*  ?HGB 12.8 12.5 12.3  ?HCT 38.5 37.2 36.9  ?MCV 100.0 101.6* 101.7*  ?MCH 33.2 34.2* 33.9  ?MCHC 33.2 33.6 33.3  ?RDW 12.9 12.9 12.9  ?PLT 164 179 160  ? ? ? ?BNP ?Recent Labs  ?Lab 03/11/22 ?1508  ?BNP 297.3*  ? ?  ? ?DDimer No results for input(s): DDIMER in the last 168 hours. ? ? ?CHA2DS2-VASc Score = 4  ?This indicates a 4.8% annual risk of stroke. ?The patient's score is based upon: ?CHF History: 1 ?HTN History: 1 ?Diabetes History: 0 ?Stroke History: 0 ?Vascular Disease History: 0 ?Age Score: 1 ?Gender Score: 1 ? ?Radiology  ?  ?No results found. ? ?Cardiac Studies  ? ?2D echo 03/01/2022 ?IMPRESSIONS  ? ? 1. Left ventricular ejection fraction, by estimation, is 60 to 65%. Left  ?ventricular ejection fraction by 2D MOD biplane is 61.6 %. The left  ?ventricle has normal function. The left ventricle has no regional wall  ?motion abnormalities. There is mild left  ?ventricular hypertrophy. Left ventricular diastolic function could not be  ?evaluated.  ? 2. Right ventricular systolic function is normal. The right ventricular  ?size is normal. There is mildly  elevated pulmonary artery systolic  ?pressure. The estimated right ventricular systolic pressure is 01.7 mmHg.  ? 3. Left atrial size was moderately dilated.  ? 4. The mitral valve is grossly normal. Mild mitral valve regurgitation.  ? 5. The aortic valve is tricuspid. Aortic valve regurgitation is trivial.  ?Aortic valve sclerosis is present, with no evidence of aortic valve  ?stenosis. Aortic regurgitation PHT measures 518 msec.  ? 6. The inferior vena cava is dilated in size with >50% respiratory  ?variability, suggesting right atrial pressure of 8 mmHg.  ? ?Patient Profile  ?   ?[name], chronic anticoagulation, [name], HTN, asthma,  who is being seen 03/12/2022 for the evaluation of SOB at the request of Dr. Nevada Crane. ? ?Assessment & Plan  ?  ?Atrial fibrillation with RVR ?-She was hospitalized 2 weeks ago with recurrent atrial fibrillation and medications were adjusted for rate control and she was to follow-up with Dr. Debara Pickett to discuss possible outpatient elective cardioversion but this has not occurred yet ?-Readmitted with increased abdominal fullness and symptoms of volume overload and found to have heart rate of 118 bpm and was started on BB but heart rate went up into the 140s but her home dose of Cardizem had not been started ?-2D echo 03/01/2022 showed normal LV function with EF 60 to 65% and moderate left atrial enlargement. ?-She has not missed any doses of her Eliquis>>I reviewed her recent admission a few weeks ago and she took her Eliquis twice the day of admission and received it BID while hospitalized and took her PM dose when she got home.  On this admission she had taken her am dose and [facility] and was given her PM dose last on 4/16 in ER and has received all doses BID since then and states and has not missed any doses at home ?-Status post DCCV to normal sinus rhythm yesterday complicated by some mild transient hypotension requiring phenylephrine>>BP stable  today at 111/84 mmHg ?-Continue apixaban 5 mg twice daily Lopressor 25 mg twice daily ?  ?Acute on chronic diastolic CHF ?-Suspect related to  ongoing atrial fibrillation with poorly controlled heart rate ?-BNP was elevated at 297 on admission with vascular congestion on chest x-ray ?-I's and O's are incomplete from yesterday but is net -1.39 L from admission ?-SCr 0.79 and potassium 3.5 yesterday ?-Recommend checking be met today to make sure potassium and magnesium are repleted adequately before discharge ?-Continue Lasix 20 mg daily ? ?Hypertension ?-BP controlled on exam today ?-Continue Lopressor 25 mg twice daily  ? ?Risk Assessment/Risk Scores:  ?    :[id number]}   ? ?New York Heart Association (NYHA) Functional Class ?NYHA Class II ?  ?CHA2DS2-VASc Score = 4  ?{This indicates a 4.8% annual risk of stroke. ?The patient's score is based upon: ?CHF History: 1 ?HTN History: 1 ?Diabetes History: 0 ?Stroke History: 0 ?Vascular Disease History: 0 ?Age Score: 1 ?Gender Score: 1 ?   ?[facility] will sign off.   ?Medication Recommendations: Lopressor 25 mg twice daily, Eliquis 5 mg twice daily, Lasix 20 mg daily ?and K-Dur 20 mg once daily ?Other recommendations (labs, testing, etc): Be met in 1 week ?Follow up as an outpatient: She has an appointment in our office on Monday/24 ? ?For questions or updates, please contact [place] ?Please consult [email or web address] for contact info under  ? ?  ?   ?Signed, ?Fransico Him, MD  ?03/16/2022, 9:28 AM    ?

## 2022-03-19 ENCOUNTER — Ambulatory Visit (INDEPENDENT_AMBULATORY_CARE_PROVIDER_SITE_OTHER): Payer: No Typology Code available for payment source | Admitting: General Practice

## 2022-03-19 ENCOUNTER — Encounter: Payer: Self-pay | Admitting: General Practice

## 2022-03-19 VITALS — BP 134/80 | HR 54 | Ht 64.0 in | Wt 221.6 lb

## 2022-03-19 DIAGNOSIS — I5033 Acute on chronic diastolic (congestive) heart failure: Secondary | ICD-10-CM

## 2022-03-19 DIAGNOSIS — I10 Essential (primary) hypertension: Secondary | ICD-10-CM | POA: Diagnosis not present

## 2022-03-19 DIAGNOSIS — Z79899 Other long term (current) drug therapy: Secondary | ICD-10-CM

## 2022-03-19 DIAGNOSIS — I480 Paroxysmal atrial fibrillation: Secondary | ICD-10-CM

## 2022-03-19 LAB — BASIC METABOLIC PANEL
BUN/Creatinine Ratio: 14 (ref 12–28)
BUN: 12 mg/dL (ref 8–27)
CO2: 28 mmol/L (ref 20–29)
Calcium: 9.7 mg/dL (ref 8.7–10.3)
Chloride: 100 mmol/L (ref 96–106)
Creatinine, Ser: 0.83 mg/dL (ref 0.57–1.00)
Glucose: 91 mg/dL (ref 70–99)
Potassium: 4.6 mmol/L (ref 3.5–5.2)
Sodium: 140 mmol/L (ref 134–144)
eGFR: 76 mL/min/{1.73_m2} (ref >59)

## 2022-03-19 MED ORDER — FUROSEMIDE 20 MG PO TABS: 20.0000 mg | ORAL_TABLET | Freq: Every day | ORAL | 6 refills | Status: DC | PRN

## 2022-03-19 MED ORDER — METOPROLOL TARTRATE 25 MG PO TABS: 25.0000 mg | ORAL_TABLET | Freq: Two times a day (BID) | ORAL | 6 refills | Status: DC

## 2022-03-19 NOTE — Patient Instructions (Signed)
Medication Instructions:  ?TAKE FUROSEMIDE (LASIX) AS NEEDED FOR WEIGHT GAIN OF 3 lb OVERNIGHT OR 5 lb IN A WEEK ? ?*If you need a refill on your cardiac medications before your next appointment, please call your pharmacy* ? ?Lab Work:    ?BMET TODAY ? ?If you have labs (blood work) drawn today and your tests are completely normal, you will receive your results only by: ?MyChart Message (if you have MyChart) OR  A paper copy in the mail ?If you have any lab test that is abnormal or we need to change your treatment, we will call you to review the results. ? ?Special Instructions ?PLEASE READ AND FOLLOW SALTY 6-ATTACHED-1,800mg  daily ? ?PLEASE INCREASE PHYSICAL ACTIVITY AS TOLERATED  ? ?PLEASE READ AND FOLLOW STRESS REDUCTION TIPS-ATTACHED ? ?Follow-Up: ?Your next appointment:  3-4 month(s) In Person with Chrystie Nose, MD  or Edd Fabian, FNP or ANY APP      ? ?At [facility], you and your health needs are our priority.  As part of our continuing mission to provide you with exceptional heart care, we have created designated Provider Care Teams.  These Care Teams include your primary Cardiologist (physician) and Advanced Practice Providers (APPs -  Physician Assistants and Nurse Practitioners) who all work together to provide you with the care you need, when you need it. ? ?We recommend signing up for the patient portal called "MyChart".  Sign up information is provided on this After Visit Summary.  MyChart is used to connect with patients for Virtual Visits (Telemedicine).  Patients are able to view lab/test results, encounter notes, upcoming appointments, etc.  Non-urgent messages can be sent to your provider as well.   ?To learn more about what you can do with MyChart, go to [email or web address].   ? ? ?Important Information About Sugar ? ? ? ? ? ? ?        6 SALTY THINGS TO AVOID     1,800MG  DAILY ? ? ? ? ? ? ?

## 2022-05-12 IMAGING — DX DG CHEST 1V PORT
1 series · 1 of 1 positions shown · non-contrast
Comparison: Chest x-ray 03/01/2022

CLINICAL DATA: Shortness of breath

EXAM:
PORTABLE CHEST 1 VIEW

[chest ap]
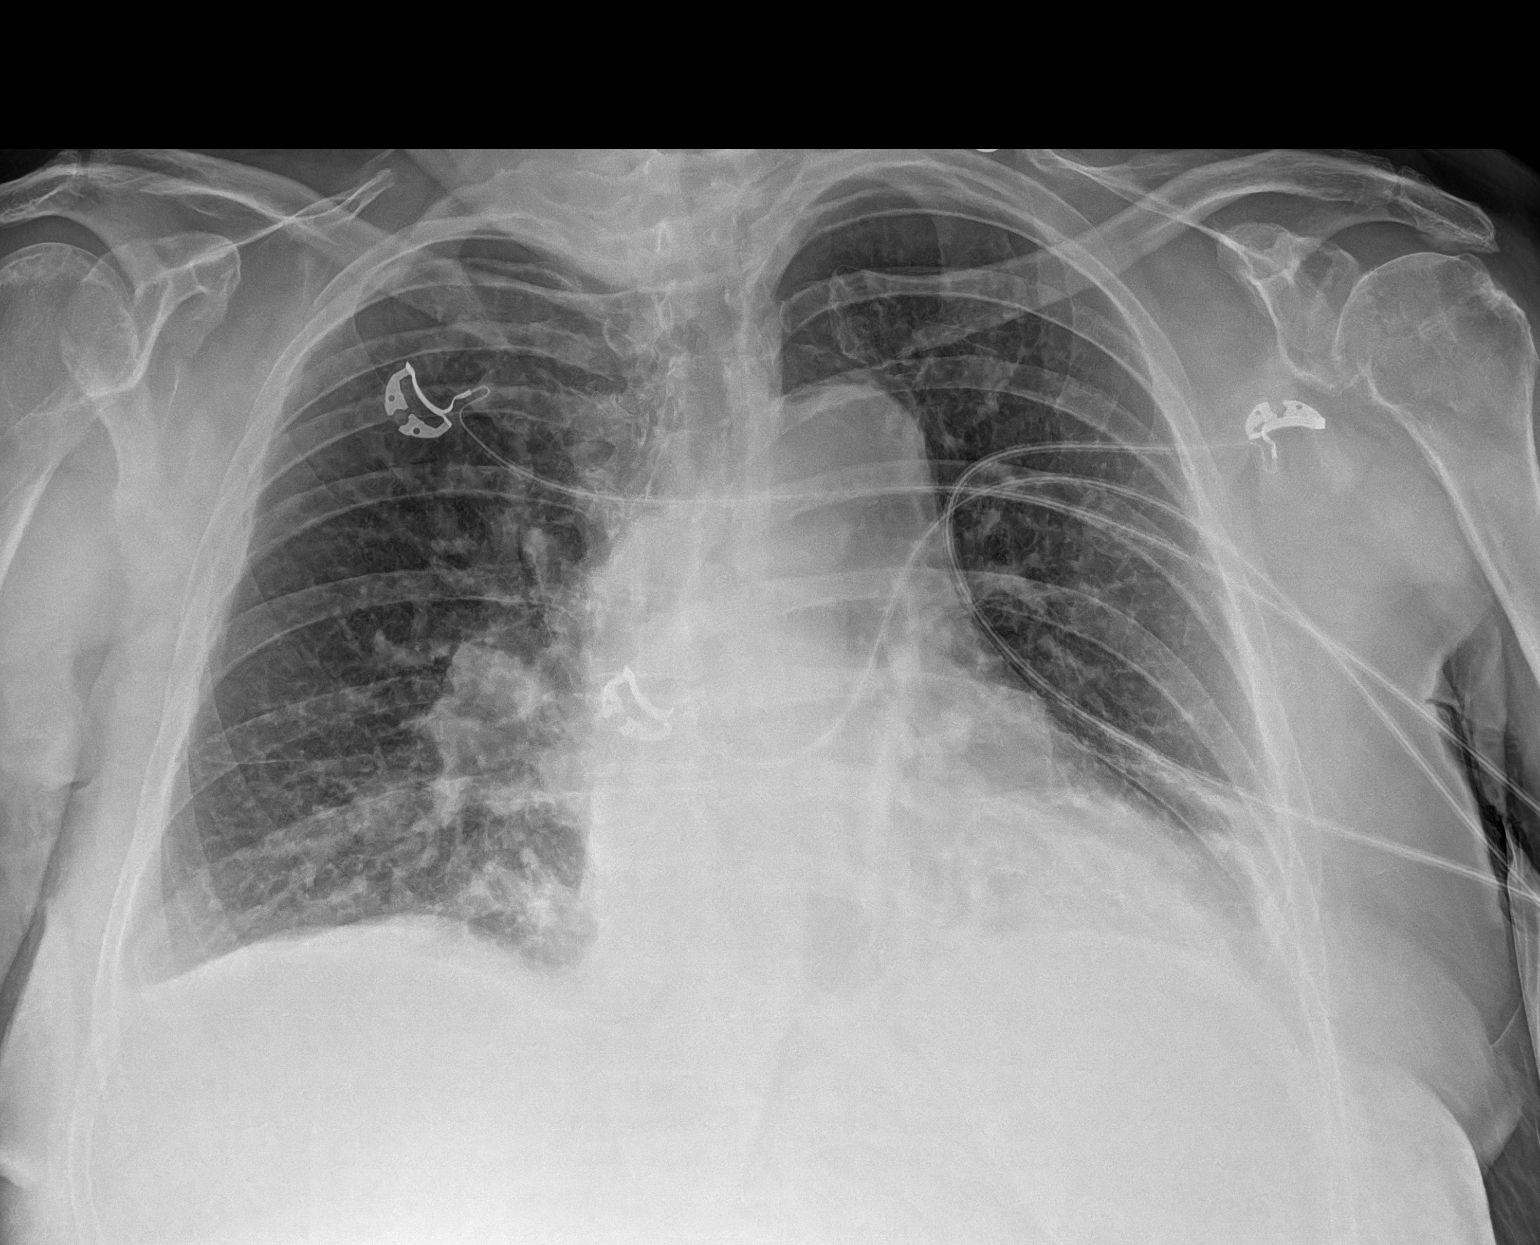

[1 of 1 positions shown; findings below may reference images not displayed]

FINDINGS: Cardiomegaly and mediastinum appear unchanged. Mildly increased
pulmonary vascular prominence since previous study. No focal
consolidation identified. No large pleural effusion. No
pneumothorax.
IMPRESSION: Cardiomegaly with mild pulmonary vascular congestion.

## 2022-05-26 DIAGNOSIS — I110 Hypertensive heart disease with heart failure: Secondary | ICD-10-CM | POA: Diagnosis not present

## 2022-05-26 DIAGNOSIS — R001 Bradycardia, unspecified: Secondary | ICD-10-CM | POA: Diagnosis not present

## 2022-05-26 DIAGNOSIS — I480 Paroxysmal atrial fibrillation: Secondary | ICD-10-CM | POA: Diagnosis not present

## 2022-05-26 DIAGNOSIS — I5031 Acute diastolic (congestive) heart failure: Secondary | ICD-10-CM | POA: Diagnosis not present

## 2022-06-16 NOTE — Progress Notes (Unsigned)
Cardiology Clinic Note   Patient Name: [name] [name] Date of Encounter: 06/18/2022  Primary Care Provider:  Iona Beard, MD Primary Cardiologist:  Pixie Casino, MD  Patient Profile    [name] [name] 71 year old female presents the clinic today for follow-up evaluation of her paroxysmal atrial fibrillation and hypertension.  Past Medical History    Past Medical History:  Diagnosis Date   A-fib ([place])    Anemia    during pneumonia   Arthritis    Asthma    Hypertension    Past Surgical History:  Procedure Laterality Date   c section     1973 and 1983   CARDIOVERSION N/A 03/15/2022   Procedure: CARDIOVERSION;  Surgeon: Elouise Munroe, MD;  Location: [facility];  Service: Cardiovascular;  Laterality: N/A;  1340 shocked @120j , NSR   COLONOSCOPY     ORIF ANKLE FRACTURE Left 08/16/2016   ORIF ANKLE FRACTURE Left 08/16/2016   Procedure: OPEN REDUCTION INTERNAL FIXATION (ORIF) LEFT BIMALLEOLAR ANKLE FRACTURE;  Surgeon: Mcarthur Rossetti, MD;  Location: [facility];  Service: Orthopedics;  Laterality: Left;   TUBAL LIGATION      Allergies  No Known Allergies  History of Present Illness    [name] [name] has a PMH of paroxysmal atrial fibrillation, HTN, asthma, and long-term use of an anticoagulant.  She initially noted onset of aching in her left shoulder and a fast heart rate.  She took an aspirin which did not help with her symptoms.  She contacted EMS.  On arrival she was noted to be in atrial fibrillation with RVR at a rate of 180 bpm.  She received IV diltiazem and spontaneously converted to sinus rhythm.  Although, she did mention recurrence of atrial fibrillation while in the emergency department.  She then again converted back to sinus rhythm.  Her CHA2DS2-VASc score is 2 which equals 2.2% stroke rate/year.  Her echocardiogram in the non-germinal LV function with an EF of 60 to 65%, trivial aortic insufficiency G2 DD.  She returned for follow-up 08/31/2016 and was doing  well.  However, she had sustained a left lower leg fracture and underwent surgery.  She denied any episodes of recurrent atrial fibrillation and her blood pressure was well controlled.  She was compliant with her anticoagulation.  She was again seen in follow-up 09/24/2017.  She continued to do well at that time.  She continues to wear a support brace for her ankle fracture.  She was not able to be as active as she was prior to her fracture.  She denied episodes of recurrent atrial fibrillation.  She reported compliance with her apixaban and denies bleeding issues.  Follow-up 09/12/2018.  She continued to be stable from a cardiac standpoint.  She had 1 episode in December of increased heart rate and palpitations when she was upset at work.  She denied any further episodes of increased heart rate or atrial fibrillation.  She continued to be somewhat sedentary.  She reported compliance with her Eliquis.  Her blood pressure was well controlled.  She followed up 09/14/2019.  During that time she continues to well.  She denied chest pain and increased shortness of breath.  She reported 1 short episode of atrial fibrillation.  Her EKG during the visit showed sinus rhythm with PACs.  She denied bleeding complications with her apixaban.  She was taking diltiazem for rate control and her blood pressure was 140/87.  09/06/2020 she was again seen in follow-up.  She continued to  do well from a cardiac standpoint.  She reported 1 episode of atrial fibrillation that was short-lived.  Her blood pressure was well controlled.  Her EKG at that time showed normal sinus rhythm.  She presented to the clinic 09/01/2021  for follow-up evaluation stated she felt well.  She could recall 2 episodes of atrial fibrillation over the past 12 months.  She reported that they both came on with increased periods of stress related to her ex-husband.  They lasted about 10 minutes or less and then dissipated on their own without intervention.   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  She walked daily for about 10 to 15 minutes.  She reported compliance with her medications.  We reviewed precautions for anticoagulation.  She expressed understanding. I refilled her diltiazem and apixaban.  I  gave her the salty 6 diet sheet, had her increase her physical activity  and planned follow-up for 1 year.  She was admitted to the hospital 03/11/22 and discharged on 03/16/2022.  She was seen by Dr. Mayford Knife prior to discharge on 03/16/2022.  During that time she was doing well post DCCV.  She had undergone DCCV 03/15/2022.  She remained in sinus rhythm.  Her echocardiogram 03/01/2022 showed an LVEF of 60-65%, diastolic parameters could not be evaluated.  She was noted to have left atrial moderate dilation, mild mitral valve regurgitation, and trivial aortic valve regurgitation.  She presented to the 03/19/2022 clinic for follow-up evaluation stated she felt well .  She had been monitoring her blood pressure and heart rate.  Her heart rate had been in the 50s at home.  Her blood pressures had been in the low 100s to 130s over 60s-80s.  She felt that stress at work could be contributing to her atrial fibrillation.  We discussed triggers for atrial fibrillation.  She was planning to retire in the next month .  We discussed her Medicare benefits.  Her weight was stable  at 221.6 pounds.  She had been taking her home supplemental potassium.   I changed her furosemide to as needed for weight increase of 3 pounds overnight or 5 pounds in a week.  I refilled her metoprolol and planned follow-up for 3 to 4 months.  She presents to the clinic today for follow-up evaluation states she feels like she had 2 episodes of atrial fibrillation in May.  They were very brief and just prior to starting her medication.  After taking her medication she felt she went back into normal rhythm.  She reports compliance with her apixaban and denies bleeding  issues.  She plans to retire in 1 week.  We reviewed triggers for atrial fibrillation and she expressed understanding.  She is now on Medicare and we will refill her medications monthly per her request.  We will plan follow-up in 6 months.  Today I will order a CBC, CMP, and direct LDL.  Today she denies chest pain, shortness of breath, lower extremity edema, fatigue, palpitations, melena, hematuria, hemoptysis, diaphoresis, weakness, presyncope, syncope, orthopnea, and PND.   Home Medications    Prior to Admission medications   Medication Sig Start Date End Date Taking? Authorizing Provider  acetaminophen (TYLENOL) 500 MG tablet Take 1,000 mg by mouth every 6 (six) hours as needed for mild pain.    [provider]  albuterol (PROVENTIL HFA;VENTOLIN HFA) 108 (90 Base) MCG/ACT inhaler Inhale 2 puffs into the lungs every 6 (six) hours as needed for wheezing  or shortness of breath.     [provider]  apixaban (ELIQUIS) 5 MG TABS tablet Take 1 tablet (5 mg total) by mouth 2 (two) times daily. 09/06/20   Hilty, Nadean Corwin, MD  B Complex-C (B-COMPLEX WITH VITAMIN C) tablet Take 1 tablet by mouth daily.    [provider]  busPIRone (BUSPAR) 5 MG tablet Take 5 mg by mouth 2 (two) times daily.     [provider]  cholecalciferol (VITAMIN D) 1000 units tablet Take 2,000 Units by mouth daily.    [provider]  diltiazem (CARTIA XT) 120 MG 24 hr capsule Take 1 capsule (120 mg total) by mouth daily. 09/06/20   Hilty, Nadean Corwin, MD  Multiple Vitamin (MULTIVITAMIN WITH MINERALS) TABS tablet Take 1 tablet by mouth daily.    [provider]  telmisartan-hydrochlorothiazide (MICARDIS HCT) 80-25 MG tablet Take 1 tablet by mouth daily.    [provider]    Family History    Family History  Problem Relation Age of Onset   Atrial fibrillation Mother    Hypertension Mother    Asthma Father    Heart attack Father    She indicated that her  mother is deceased. She indicated that her father is deceased.   Social History    Social History   Socioeconomic History   Marital status: Single    Spouse name: Not on file   Number of children: Not on file   Years of education: Not on file   Highest education level: Not on file  Occupational History   Not on file  Tobacco Use   Smoking status: Never   Smokeless tobacco: Never  Vaping Use   Vaping Use: Never used  Substance and Sexual Activity   Alcohol use: No   Drug use: No   Sexual activity: Not Currently  Other Topics Concern   Not on file  Social History Narrative   Not on file   Social Determinants of Health   Financial Resource Strain: Not on file  Food Insecurity: Not on file  Transportation Needs: Not on file  Physical Activity: Not on file  Stress: Not on file  Social Connections: Not on file  Intimate Partner Violence: Not on file     Review of Systems    General:  No chills, fever, night sweats or weight changes.  Cardiovascular:  No chest pain, dyspnea on exertion, edema, orthopnea, palpitations, paroxysmal nocturnal dyspnea. Dermatological: No rash, lesions/masses Respiratory: No cough, dyspnea Urologic: No hematuria, dysuria Abdominal:   No nausea, vomiting, diarrhea, bright red blood per rectum, melena, or hematemesis Neurologic:  No visual changes, wkns, changes in mental status. All other systems reviewed and are otherwise negative except as noted above.  Physical Exam    VS:  BP 138/76   Pulse (!) 58   Ht 5\' 4"  (1.626 m)   Wt 216 lb 6.4 oz (98.2 kg)   SpO2 98%   BMI 37.14 kg/m  , BMI Body mass index is 37.14 kg/m. GEN: Well nourished, well developed, in no acute distress. HEENT: normal. Neck: Supple, no JVD, carotid bruits, or masses. Cardiac: RRR, no murmurs, rubs, or gallops. No clubbing, cyanosis, no edema radials/DP/PT 2+ and equal bilaterally.  Respiratory:  Respirations regular and unlabored, clear to auscultation  bilaterally. GI: Soft, nontender, nondistended, BS + x 4. MS: no deformity or atrophy. Skin: warm and dry, no rash. Neuro:  Strength and sensation are intact. Psych: Normal affect.  Accessory Clinical Findings  Recent Labs: 03/11/2022: ALT 62; B Natriuretic Peptide 297.3; TSH 3.685 03/14/2022: Hemoglobin 12.3; Magnesium 1.9; Platelets 160 03/19/2022: BUN 12; Creatinine, Ser 0.83; Potassium 4.6; Sodium 140   Recent Lipid Panel No results found for: "CHOL", "TRIG", "HDL", "CHOLHDL", "VLDL", "LDLCALC", "LDLDIRECT"  ECG personally reviewed by me today-none today. EKG 03/19/2022 sinus bradycardia first-degree AV block 54 bpm-no ST or T wave deviation.  normal sinus rhythm left axis deviation anterior infarct undetermined age 51 bpm- No acute changes  Echocardiogram 02/22/2016  Study Conclusions   - Left ventricle: The cavity size was normal. Wall thickness was    normal. Systolic function was normal. The estimated ejection    fraction was in the range of 60% to 65%. Wall motion was normal;    there were no regional wall motion abnormalities. Features are    consistent with a pseudonormal left ventricular filling pattern,    with concomitant abnormal relaxation and increased filling    pressure (grade 2 diastolic dysfunction).  - Aortic valve: There was trivial regurgitation. Valve area (Vmax):    2.12 cm^2.  - Mitral valve: Mildly to moderately calcified annulus.  - Left atrium: The atrium was mildly dilated.   Echocardiogram 03/01/2022 IMPRESSIONS     1. Left ventricular ejection fraction, by estimation, is 60 to 65%. Left  ventricular ejection fraction by 2D MOD biplane is 61.6 %. The left  ventricle has normal function. The left ventricle has no regional wall  motion abnormalities. There is mild left  ventricular hypertrophy. Left ventricular diastolic function could not be  evaluated.   2. Right ventricular systolic function is normal. The right ventricular  size is  normal. There is mildly elevated pulmonary artery systolic  pressure. The estimated right ventricular systolic pressure is 36.3 mmHg.   3. Left atrial size was moderately dilated.   4. The mitral valve is grossly normal. Mild mitral valve regurgitation.   5. The aortic valve is tricuspid. Aortic valve regurgitation is trivial.  Aortic valve sclerosis is present, with no evidence of aortic valve  stenosis. Aortic regurgitation PHT measures 518 msec.   6. The inferior vena cava is dilated in size with >50% respiratory  variability, suggesting right atrial pressure of 8 mmHg.     Assessment & Plan   1.  Paroxysmal atrial fibrillation-heart rate today 58.  Reports 2 episodes of brief atrial fibrillation 2 weeks apart in May just before she took her medication.  Underwent DCCV on 03/15/2022.    Echocardiogram 03/01/2022 showed normal LVEF.  Denies bleeding issues.  Compliant with apixaban. Continue apixaban, diltiazem, metoprolol Heart healthy low-sodium diet-salty 6  Increase physical activity as tolerated Avoid triggers caffeine, chocolate, EtOH, dehydration etc.-reviewed  Essential hypertension-BP today 138/76.   Continue telmisartan, hydrochlorothiazide, diltiazem Heart healthy low-sodium diet-salty 6 given Increase physical activity as tolerated  Acute on chronic diastolic CHF-weight today 216.4 pounds.  Euvolemic .No increased DOE or activity intolerance.   Continue  metoprolol, furosemide as needed Increase physical activity as tolerated Daily weights-contact office with weight increase of 3 pounds overnight or 5 pounds in 1 week Ordered CMP, CBC, direct LDL  Disposition: Follow-up with Dr. Rennis Golden or me in 6 months.  Thomasene Ripple. Waldron Gerry NP-C    06/18/2022, 9:34 AM [facility] [address] Office [phone] Fax [phone]  Notice: This dictation was prepared with Dragon dictation along with smaller phrase technology. Any transcriptional  errors that result from this process are unintentional and may not be corrected upon  review.  I spent 13 minutes examining this patient, reviewing medications, and using patient centered shared decision making involving her cardiac care.  Prior to her visit I spent greater than 20 minutes reviewing her past medical history,  medications, and prior cardiac tests.

## 2022-06-18 ENCOUNTER — Encounter: Payer: Self-pay | Admitting: General Practice

## 2022-06-18 ENCOUNTER — Ambulatory Visit (INDEPENDENT_AMBULATORY_CARE_PROVIDER_SITE_OTHER): Payer: No Typology Code available for payment source | Admitting: General Practice

## 2022-06-18 VITALS — BP 138/76 | HR 58 | Ht 64.0 in | Wt 216.4 lb

## 2022-06-18 DIAGNOSIS — I10 Essential (primary) hypertension: Secondary | ICD-10-CM | POA: Diagnosis not present

## 2022-06-18 DIAGNOSIS — I5033 Acute on chronic diastolic (congestive) heart failure: Secondary | ICD-10-CM

## 2022-06-18 DIAGNOSIS — I480 Paroxysmal atrial fibrillation: Secondary | ICD-10-CM | POA: Diagnosis not present

## 2022-06-18 MED ORDER — APIXABAN 5 MG PO TABS: 5.0000 mg | ORAL_TABLET | Freq: Two times a day (BID) | ORAL | 1 refills | Status: DC

## 2022-06-18 MED ORDER — METOPROLOL TARTRATE 25 MG PO TABS: 25.0000 mg | ORAL_TABLET | Freq: Two times a day (BID) | ORAL | 6 refills | Status: DC

## 2022-06-18 MED ORDER — DILTIAZEM HCL ER COATED BEADS 180 MG PO CP24: 180.0000 mg | ORAL_CAPSULE | Freq: Every day | ORAL | 3 refills | Status: DC

## 2022-06-18 NOTE — Patient Instructions (Signed)
Medication Instructions:  The current medical regimen is effective;  continue present plan and medications as directed. Please refer to the Current Medication list given to you today.   *If you need a refill on your cardiac medications before your next appointment, please call your pharmacy*  Lab Work:    DIR-LDL, CBC AND CMP     If you have labs (blood work) drawn today and your tests are completely normal, you will receive your results only by: MyChart Message (if you have MyChart) OR  A paper copy in the mail If you have any lab test that is abnormal or we need to change your treatment, we will call you to review the results.  Special Instructions INCREASE PHYSICAL ACTIVITY AS TOLERATED  Follow-Up: Your next appointment:  6 month(s) In Person with Chrystie Nose, MD   Please call our office 2 months in advance to schedule this appointment   At [facility], you and your health needs are our priority.  As part of our continuing mission to provide you with exceptional heart care, we have created designated Provider Care Teams.  These Care Teams include your primary Cardiologist (physician) and Advanced Practice Providers (APPs -  Physician Assistants and Nurse Practitioners) who all work together to provide you with the care you need, when you need it.  We recommend signing up for the patient portal called "MyChart".  Sign up information is provided on this After Visit Summary.  MyChart is used to connect with patients for Virtual Visits (Telemedicine).  Patients are able to view lab/test results, encounter notes, upcoming appointments, etc.  Non-urgent messages can be sent to your provider as well.   To learn more about what you can do with MyChart, go to [email or web address].    Important Information About Sugar

## 2022-06-19 ENCOUNTER — Other Ambulatory Visit: Payer: Self-pay

## 2022-06-19 DIAGNOSIS — I5033 Acute on chronic diastolic (congestive) heart failure: Secondary | ICD-10-CM

## 2022-06-19 DIAGNOSIS — Z79899 Other long term (current) drug therapy: Secondary | ICD-10-CM

## 2022-06-19 DIAGNOSIS — I10 Essential (primary) hypertension: Secondary | ICD-10-CM

## 2022-06-19 DIAGNOSIS — Z7901 Long term (current) use of anticoagulants: Secondary | ICD-10-CM

## 2022-06-19 DIAGNOSIS — I480 Paroxysmal atrial fibrillation: Secondary | ICD-10-CM

## 2022-06-19 LAB — COMPREHENSIVE METABOLIC PANEL
ALT: 12 IU/L (ref 0–32)
AST: 12 IU/L (ref 0–40)
Albumin/Globulin Ratio: 1.5 (ref 1.2–2.2)
Albumin: 4.1 g/dL (ref 3.9–4.9)
Alkaline Phosphatase: 56 IU/L (ref 44–121)
BUN/Creatinine Ratio: 14 (ref 12–28)
BUN: 12 mg/dL (ref 8–27)
Bilirubin Total: 0.5 mg/dL (ref 0.0–1.2)
CO2: 28 mmol/L (ref 20–29)
Calcium: 9.2 mg/dL (ref 8.7–10.3)
Chloride: 102 mmol/L (ref 96–106)
Creatinine, Ser: 0.84 mg/dL (ref 0.57–1.00)
Globulin, Total: 2.7 g/dL (ref 1.5–4.5)
Glucose: 143 mg/dL — ABNORMAL HIGH (ref 70–99)
Potassium: 4.3 mmol/L (ref 3.5–5.2)
Sodium: 140 mmol/L (ref 134–144)
Total Protein: 6.8 g/dL (ref 6.0–8.5)
eGFR: 75 mL/min/{1.73_m2} (ref >59)

## 2022-06-19 LAB — CBC
Hematocrit: 38.4 % (ref 34.0–46.6)
Hemoglobin: 13.1 g/dL (ref 11.1–15.9)
MCH: 32.1 pg (ref 26.6–33.0)
MCHC: 34.1 g/dL (ref 31.5–35.7)
MCV: 94 fL (ref 79–97)
Platelets: 101 10*3/uL — ABNORMAL LOW (ref 150–450)
RBC: 4.08 x10E6/uL (ref 3.77–5.28)
RDW: 12.1 % (ref 11.7–15.4)
WBC: 5.1 10*3/uL (ref 3.4–10.8)

## 2022-06-25 ENCOUNTER — Other Ambulatory Visit: Payer: Self-pay

## 2022-06-25 DIAGNOSIS — Z79899 Other long term (current) drug therapy: Secondary | ICD-10-CM

## 2022-06-25 DIAGNOSIS — Z7901 Long term (current) use of anticoagulants: Secondary | ICD-10-CM | POA: Diagnosis not present

## 2022-06-25 DIAGNOSIS — I10 Essential (primary) hypertension: Secondary | ICD-10-CM | POA: Diagnosis not present

## 2022-06-25 DIAGNOSIS — I5033 Acute on chronic diastolic (congestive) heart failure: Secondary | ICD-10-CM | POA: Diagnosis not present

## 2022-06-25 DIAGNOSIS — I480 Paroxysmal atrial fibrillation: Secondary | ICD-10-CM | POA: Diagnosis not present

## 2022-06-25 LAB — CBC
Hematocrit: 39.1 % (ref 34.0–46.6)
Hemoglobin: 13.3 g/dL (ref 11.1–15.9)
MCH: 32.4 pg (ref 26.6–33.0)
MCHC: 34 g/dL (ref 31.5–35.7)
MCV: 95 fL (ref 79–97)
Platelets: 123 10*3/uL — ABNORMAL LOW (ref 150–450)
RBC: 4.11 x10E6/uL (ref 3.77–5.28)
RDW: 12.2 % (ref 11.7–15.4)
WBC: 5 10*3/uL (ref 3.4–10.8)

## 2022-07-19 ENCOUNTER — Other Ambulatory Visit: Payer: Self-pay

## 2022-07-19 ENCOUNTER — Emergency Department (HOSPITAL_COMMUNITY)
Admission: EM | Admit: 2022-07-19 | Discharge: 2022-07-19 | Disposition: A | Discharge status: Home / Self Care | Payer: No Typology Code available for payment source | Attending: Emergency Medicine | Admitting: Emergency Medicine

## 2022-07-19 ENCOUNTER — Emergency Department (HOSPITAL_COMMUNITY): Payer: No Typology Code available for payment source

## 2022-07-19 ENCOUNTER — Encounter (HOSPITAL_COMMUNITY): Payer: Self-pay

## 2022-07-19 DIAGNOSIS — I509 Heart failure, unspecified: Secondary | ICD-10-CM | POA: Diagnosis not present

## 2022-07-19 DIAGNOSIS — R000 Tachycardia, unspecified: Secondary | ICD-10-CM | POA: Diagnosis not present

## 2022-07-19 DIAGNOSIS — R001 Bradycardia, unspecified: Secondary | ICD-10-CM | POA: Diagnosis not present

## 2022-07-19 DIAGNOSIS — N179 Acute kidney failure, unspecified: Secondary | ICD-10-CM | POA: Diagnosis not present

## 2022-07-19 DIAGNOSIS — R002 Palpitations: Secondary | ICD-10-CM | POA: Insufficient documentation

## 2022-07-19 DIAGNOSIS — R0602 Shortness of breath: Secondary | ICD-10-CM | POA: Diagnosis not present

## 2022-07-19 DIAGNOSIS — Z7901 Long term (current) use of anticoagulants: Secondary | ICD-10-CM | POA: Diagnosis not present

## 2022-07-19 DIAGNOSIS — R079 Chest pain, unspecified: Secondary | ICD-10-CM | POA: Diagnosis not present

## 2022-07-19 LAB — COMPREHENSIVE METABOLIC PANEL
ALT: 14 U/L (ref 0–44)
AST: 18 U/L (ref 15–41)
Albumin: 4.1 g/dL (ref 3.5–5.0)
Alkaline Phosphatase: 57 U/L (ref 38–126)
Anion gap: 6 (ref 5–15)
BUN: 13 mg/dL (ref 8–23)
CO2: 25 mmol/L (ref 22–32)
Calcium: 9.1 mg/dL (ref 8.9–10.3)
Chloride: 109 mmol/L (ref 98–111)
Creatinine, Ser: 1.27 mg/dL — ABNORMAL HIGH (ref 0.44–1.00)
GFR, Estimated: 45 mL/min — ABNORMAL LOW (ref >60)
Glucose, Bld: 127 mg/dL — ABNORMAL HIGH (ref 70–99)
Potassium: 3.7 mmol/L (ref 3.5–5.1)
Sodium: 140 mmol/L (ref 135–145)
Total Bilirubin: 0.7 mg/dL (ref 0.3–1.2)
Total Protein: 7.4 g/dL (ref 6.5–8.1)

## 2022-07-19 LAB — CBC WITH DIFFERENTIAL/PLATELET
Abs Immature Granulocytes: 0.01 10*3/uL (ref 0.00–0.07)
Basophils Absolute: 0.1 10*3/uL (ref 0.0–0.1)
Basophils Relative: 1 %
Eosinophils Absolute: 0.1 10*3/uL (ref 0.0–0.5)
Eosinophils Relative: 2 %
HCT: 42.6 % (ref 36.0–46.0)
Hemoglobin: 14.4 g/dL (ref 12.0–15.0)
Immature Granulocytes: 0 %
Lymphocytes Relative: 39 %
Lymphs Abs: 2.7 10*3/uL (ref 0.7–4.0)
MCH: 33 pg (ref 26.0–34.0)
MCHC: 33.8 g/dL (ref 30.0–36.0)
MCV: 97.5 fL (ref 80.0–100.0)
Monocytes Absolute: 0.6 10*3/uL (ref 0.1–1.0)
Monocytes Relative: 9 %
Neutro Abs: 3.4 10*3/uL (ref 1.7–7.7)
Neutrophils Relative %: 49 %
Platelets: 168 10*3/uL (ref 150–400)
RBC: 4.37 MIL/uL (ref 3.87–5.11)
RDW: 12.8 % (ref 11.5–15.5)
WBC: 6.8 10*3/uL (ref 4.0–10.5)
nRBC: 0 % (ref 0.0–0.2)

## 2022-07-19 LAB — TROPONIN I (HIGH SENSITIVITY): Troponin I (High Sensitivity): 5 ng/L (ref <18)

## 2022-07-19 NOTE — ED Provider Notes (Signed)
[facility]-EMERGENCY DEPT Provider Note   CSN: [id number] Arrival date & time: 07/19/22  2133     History  Chief Complaint  Patient presents with   Chest Pain    [name] [name] is a 71 y.o. female who presents to the ER with concern for elevated And heart rate at home.  States that 7:15 PM tonight she noted her heart rate was elevating.  She had just taken her evening dose of Lopressor minutes before.  States that her heart rate climbed over the next hour to greater than 160 bpm.  She took an additional 25 mg of Lopressor (total of 50 mg this evening" and an old prescription for diltiazem 120 mg at home and departed for the emergency department.  She has by the time she arrived to the hospital she was feeling normal, no longer feeling that her heart was racing but still wanted to be evaluated.  I personallymedical records.  Has history of paroxysmal A-fib, CHF.  She is anticoagulated on Eliquis, denies any missed doses.  HPI     Home Medications Prior to Admission medications   Medication Sig Start Date End Date Taking? Authorizing Provider  acetaminophen (TYLENOL) 500 MG tablet Take 1,000 mg by mouth every 6 (six) hours as needed for mild pain.    [provider]  apixaban (ELIQUIS) 5 MG TABS tablet Take 1 tablet (5 mg total) by mouth 2 (two) times daily. 06/18/22   Ronney Asters, NP  busPIRone (BUSPAR) 5 MG tablet Take 5 mg by mouth 2 (two) times daily.     [provider]  cholecalciferol (VITAMIN D) 1000 units tablet Take 2,000 Units by mouth daily.    [provider]  diltiazem (CARDIZEM CD) 180 MG 24 hr capsule Take 1 capsule (180 mg total) by mouth daily. 06/18/22   Ronney Asters, NP  furosemide (LASIX) 20 MG tablet Take 1 tablet (20 mg total) by mouth daily as needed. 03/19/22   Ronney Asters, NP  metoprolol tartrate (LOPRESSOR) 25 MG tablet Take 1 tablet (25 mg total) by mouth 2 (two) times daily. 06/18/22   Ronney Asters, NP  Multiple Vitamin (MULTIVITAMIN WITH MINERALS) TABS tablet Take 1 tablet by mouth daily.    [provider]      Allergies    Patient has no known allergies.    Review of Systems   Review of Systems  Constitutional: Negative.   HENT: Negative.    Respiratory: Negative.    Cardiovascular:  Positive for palpitations. Negative for chest pain.  Genitourinary: Negative.   Neurological: Negative.     Physical Exam Updated Vital Signs BP 121/78   Pulse (!) 53   Temp 98 F (36.7 C)   Resp 19   Ht 5\' 4"  (1.626 m)   Wt 97.1 kg   SpO2 94%   BMI 36.73 kg/m  Physical Exam Vitals and nursing note reviewed.  Constitutional:      Appearance: She is not ill-appearing or toxic-appearing.  HENT:     Head: Normocephalic and atraumatic.     Mouth/Throat:     Mouth: Mucous membranes are moist.     Pharynx: No oropharyngeal exudate or posterior oropharyngeal erythema.  Eyes:     General:        Right eye: No discharge.        Left eye: No discharge.     Conjunctiva/sclera: Conjunctivae normal.  Cardiovascular:     Rate and Rhythm:  Regular rhythm. Bradycardia present.     Pulses: Normal pulses.     Heart sounds: Normal heart sounds. No murmur heard.    Comments: HR in the 50-60s, at patient's baseline per chart review. Pulmonary:     Effort: Pulmonary effort is normal. No tachypnea, bradypnea, accessory muscle usage or respiratory distress.     Breath sounds: Normal breath sounds. No wheezing or rales.  Chest:     Chest wall: No mass, tenderness or edema.  Abdominal:     General: Bowel sounds are normal. There is no distension.     Palpations: Abdomen is soft.     Tenderness: There is no abdominal tenderness.  Musculoskeletal:        General: No deformity.     Cervical back: Neck supple.     Right lower leg: No edema.     Left lower leg: No edema.  Skin:    General: Skin is warm and dry.     Capillary Refill: Capillary refill takes less than 2 seconds.   Neurological:     General: No focal deficit present.     Mental Status: She is alert and oriented to person, place, and time. Mental status is at baseline.  Psychiatric:        Mood and Affect: Mood normal.     ED Results / Procedures / Treatments   Labs (all labs ordered are listed, but only abnormal results are displayed) Labs Reviewed  COMPREHENSIVE METABOLIC PANEL - Abnormal; Notable for the following components:      Result Value   Glucose, Bld 127 (*)    Creatinine, Ser 1.27 (*)    GFR, Estimated 45 (*)    All other components within normal limits  CBC WITH DIFFERENTIAL/PLATELET  TROPONIN I (HIGH SENSITIVITY)  TROPONIN I (HIGH SENSITIVITY)    EKG EKG Interpretation  Date/Time:  Thursday July 19 2022 21:46:29 EDT Ventricular Rate:  61 PR Interval:  189 QRS Duration: 88 QT Interval:  419 QTC Calculation: 422 R Axis:   -49 Text Interpretation: Sinus rhythm LAD, consider left anterior fascicular block Low voltage, precordial leads Consider anterior infarct No significant change was found Confirmed by Glynn Octave [phone]) on 07/19/2022 10:54:25 PM  Radiology DG Chest 2 View  Result Date: 07/19/2022 CLINICAL DATA:  Mid chest pain, shortness of breath and tachycardia. EXAM: CHEST - 2 VIEW COMPARISON:  Portable chest 03/11/2022. FINDINGS: There is mild cardiomegaly without evidence of CHF. There is linear scarring or atelectasis in the left base but no evidence of focal pneumonia. The lungs are otherwise clear and the sulci are sharp. There is mild aortic tortuosity and atherosclerosis, stable mediastinum. Osteopenia and mild thoracic kyphodextroscoliosis. IMPRESSION: No evidence of acute chest disease.  Mild cardiomegaly. Electronically Signed   By: Almira Bar M.D.   On: 07/19/2022 22:26    Procedures Procedures    Medications Ordered in ED Medications - No data to display  ED Course/ Medical Decision Making/ A&P                           Medical Decision  Making 71 year old female who presents with concern for rapid heart rate at home, in context of known afib.   HTN on intake, cardiopulmonary exam is normal with the exception of mild bradycardia, with HR of 59. Regular rhythm.  Lungs CTA B bilaterally.    Amount and/or Complexity of Data Reviewed Labs: ordered.    Details: CBC  without leukocytosis or anemia, CMP with AKI with creatinine of 1.2 increased from patient's baseline of 0.8.  Normal BUN.  Troponin negative, 5. Radiology: ordered.    Details: Chest x-ray visualized this provider negative for acute cardiopulmonary disease.  ECG/medicine tests:     Details: EKG with normal sinus rhythm, no STEMI.    Suspect possible episode of A-fib with RVR at home though exact etiology of patient's palpitations and rapid heart rate at home cannot be identified as she was not evaluated during this episode.  Caution patient regarding taking extra doses of her medications without direction from medical provider, however given has been nearly 5 hours since she took each of these medications and her heart rate is at her baseline do not feel any further observation is warranted in the ED at this time.  IV fluids offered given mild AKI noted on labs, however patient declined favor of oral rehydration therapy.  I encourage patient to increase noncaffeinated fluid intake at home and to follow closely with her PCP for recheck of her kidney function in the next week.  No further report to the ER this time.  Clinical concern for emergent underlying etiology or further ED work-up or inpatient management is exceedingly low.  Nilda and her husband voiced understanding of her medical evaluation and treatment plan. Each of their questions answered to their expressed satisfaction.  Return precautions were given.  Patient is well-appearing, stable, and was discharged in good condition.  This chart was dictated using voice recognition software, Dragon. Despite the best  efforts of this provider to proofread and correct errors, errors may still occur which can change documentation meaning.  Final Clinical Impression(s) / ED Diagnoses Final diagnoses:  Palpitations  AKI (acute kidney injury) [facility])    Rx / DC Orders ED Discharge Orders     None         Sherrilee Gilles 07/19/22 2346    Mesner, Barbara Cower, MD 07/20/22 [phone]

## 2022-07-19 NOTE — ED Triage Notes (Signed)
Chest heaviness sensation that began tonight after eating dinner and taking night medications.   Hx afib. Noticed her hr was >160. Took a diltiazem and additional lopressor with hr going back to the 70's and then became elevated again.

## 2022-07-19 NOTE — Discharge Instructions (Signed)
You are seen in the ER today for your rapid heart rate and palpitations at home.  Your physical exam, vital signs, blood work, chest x-ray and EKG in the ER were very reassuring.  Suspect you may have had an episode of your A-fib with a rapid heart rate at home which seems to have improved following the doses of your oral medications prior to arrival to the ER.  Is not any emergent problem with your heart or lungs at this time. Your blood work did show an elevation in your kidney labs suggesting possible dehydration.  Please follow-up with your primary care doctor for recheck of your kidney levels next week.  Return to the ER with any severe symptoms.

## 2022-07-24 DIAGNOSIS — Z6836 Body mass index (BMI) 36.0-36.9, adult: Secondary | ICD-10-CM | POA: Diagnosis not present

## 2022-07-24 DIAGNOSIS — N183 Chronic kidney disease, stage 3 unspecified: Secondary | ICD-10-CM | POA: Diagnosis not present

## 2022-07-24 DIAGNOSIS — E663 Overweight: Secondary | ICD-10-CM | POA: Diagnosis not present

## 2022-07-24 DIAGNOSIS — I480 Paroxysmal atrial fibrillation: Secondary | ICD-10-CM | POA: Diagnosis not present

## 2022-07-24 DIAGNOSIS — I10 Essential (primary) hypertension: Secondary | ICD-10-CM | POA: Diagnosis not present

## 2022-08-27 DIAGNOSIS — I10 Essential (primary) hypertension: Secondary | ICD-10-CM | POA: Diagnosis not present

## 2022-08-27 DIAGNOSIS — I480 Paroxysmal atrial fibrillation: Secondary | ICD-10-CM | POA: Diagnosis not present

## 2022-08-27 DIAGNOSIS — E785 Hyperlipidemia, unspecified: Secondary | ICD-10-CM | POA: Diagnosis not present

## 2022-10-15 DIAGNOSIS — Z6837 Body mass index (BMI) 37.0-37.9, adult: Secondary | ICD-10-CM | POA: Diagnosis not present

## 2022-10-15 DIAGNOSIS — I10 Essential (primary) hypertension: Secondary | ICD-10-CM | POA: Diagnosis not present

## 2022-10-15 DIAGNOSIS — I480 Paroxysmal atrial fibrillation: Secondary | ICD-10-CM | POA: Diagnosis not present

## 2022-10-15 DIAGNOSIS — E6609 Other obesity due to excess calories: Secondary | ICD-10-CM | POA: Diagnosis not present

## 2022-10-15 DIAGNOSIS — R001 Bradycardia, unspecified: Secondary | ICD-10-CM | POA: Diagnosis not present

## 2022-10-15 DIAGNOSIS — F4321 Adjustment disorder with depressed mood: Secondary | ICD-10-CM | POA: Diagnosis not present

## 2022-10-15 DIAGNOSIS — Z0001 Encounter for general adult medical examination with abnormal findings: Secondary | ICD-10-CM | POA: Diagnosis not present

## 2022-10-20 ENCOUNTER — Emergency Department (HOSPITAL_COMMUNITY)
Admission: EM | Admit: 2022-10-20 | Discharge: 2022-10-20 | Disposition: A | Discharge status: Home / Self Care | Payer: No Typology Code available for payment source | Attending: Emergency Medicine | Admitting: Emergency Medicine

## 2022-10-20 ENCOUNTER — Emergency Department (HOSPITAL_COMMUNITY): Payer: No Typology Code available for payment source

## 2022-10-20 ENCOUNTER — Other Ambulatory Visit: Payer: Self-pay

## 2022-10-20 ENCOUNTER — Encounter (HOSPITAL_COMMUNITY): Payer: Self-pay

## 2022-10-20 DIAGNOSIS — I4891 Unspecified atrial fibrillation: Secondary | ICD-10-CM | POA: Diagnosis not present

## 2022-10-20 DIAGNOSIS — I10 Essential (primary) hypertension: Secondary | ICD-10-CM | POA: Insufficient documentation

## 2022-10-20 DIAGNOSIS — I509 Heart failure, unspecified: Secondary | ICD-10-CM | POA: Diagnosis not present

## 2022-10-20 DIAGNOSIS — Z79899 Other long term (current) drug therapy: Secondary | ICD-10-CM | POA: Diagnosis not present

## 2022-10-20 DIAGNOSIS — J45909 Unspecified asthma, uncomplicated: Secondary | ICD-10-CM | POA: Insufficient documentation

## 2022-10-20 DIAGNOSIS — Z7901 Long term (current) use of anticoagulants: Secondary | ICD-10-CM | POA: Insufficient documentation

## 2022-10-20 LAB — BASIC METABOLIC PANEL
Anion gap: 8 (ref 5–15)
BUN: <5 mg/dL — ABNORMAL LOW (ref 8–23)
CO2: 24 mmol/L (ref 22–32)
Calcium: 9.2 mg/dL (ref 8.9–10.3)
Chloride: 106 mmol/L (ref 98–111)
Creatinine, Ser: <0.3 mg/dL — ABNORMAL LOW (ref 0.44–1.00)
Glucose, Bld: 108 mg/dL — ABNORMAL HIGH (ref 70–99)
Potassium: 4.5 mmol/L (ref 3.5–5.1)
Sodium: 138 mmol/L (ref 135–145)

## 2022-10-20 LAB — TROPONIN I (HIGH SENSITIVITY)
Troponin I (High Sensitivity): 4 ng/L (ref <18)
Troponin I (High Sensitivity): 4 ng/L (ref <18)

## 2022-10-20 LAB — CBC
HCT: 46 % (ref 36.0–46.0)
Hemoglobin: 15.1 g/dL — ABNORMAL HIGH (ref 12.0–15.0)
MCH: 33.2 pg (ref 26.0–34.0)
MCHC: 32.8 g/dL (ref 30.0–36.0)
MCV: 101.1 fL — ABNORMAL HIGH (ref 80.0–100.0)
Platelets: 196 10*3/uL (ref 150–400)
RBC: 4.55 MIL/uL (ref 3.87–5.11)
RDW: 12.4 % (ref 11.5–15.5)
WBC: 7.2 10*3/uL (ref 4.0–10.5)
nRBC: 0 % (ref 0.0–0.2)

## 2022-10-20 MED ORDER — SODIUM CHLORIDE 0.9 % IV BOLUS
1000.0000 mL | Freq: Once | INTRAVENOUS | Status: AC
  Administered 2022-10-20: 1000 mL via INTRAVENOUS

## 2022-10-20 MED ORDER — PROPOFOL 10 MG/ML IV BOLUS
INTRAVENOUS | Status: AC | PRN
  Administered 2022-10-20: 48.75 mg via INTRAVENOUS

## 2022-10-20 MED ORDER — PROPOFOL 10 MG/ML IV BOLUS
0.5000 mg/kg | Freq: Once | INTRAVENOUS | Status: AC
  Administered 2022-10-20: 48.8 mg via INTRAVENOUS
  Filled 2022-10-20: qty 20

## 2022-10-20 NOTE — ED Provider Notes (Addendum)
[facility]-EMERGENCY DEPT Provider Note   CSN: [id number] Arrival date & time: 10/20/22  2919     History  Chief Complaint  Patient presents with   Atrial Fibrillation    [name] [name] is a 71 y.o. female with medical history of paroxysmal A-fib, anemia, arthritis, asthma, hypertension.  Patient complains of atrial fibrillation.  Patient reports that this morning around 12 AM she was awoken from her sleep because "my heart was beating out of my chest".  Patient states that her pulse has been fluctuating during the course of the morning.  Patient reports that she has A-fib and is currently taking Lopressor, Cardizem, Eliquis.  Patient reports last time she took these medications was this morning around 730.  The patient states she was recently admitted in April for A-fib RVR requiring cardioversion after 1 week.  The patient currently denies any shortness of breath, chest pain, lightheadedness, dizziness, weakness, syncope.  The patient denies any leg swelling.  Patient does have history of CHF, takes fluid pills as needed.    Atrial Fibrillation Pertinent negatives include no chest pain and no shortness of breath.       Home Medications Prior to Admission medications   Medication Sig Start Date End Date Taking? Authorizing Provider  acetaminophen (TYLENOL) 500 MG tablet Take 1,000 mg by mouth every 6 (six) hours as needed for mild pain.    [provider]  apixaban (ELIQUIS) 5 MG TABS tablet Take 1 tablet (5 mg total) by mouth 2 (two) times daily. 06/18/22   Ronney Asters, NP  busPIRone (BUSPAR) 5 MG tablet Take 5 mg by mouth 2 (two) times daily.     [provider]  cholecalciferol (VITAMIN D) 1000 units tablet Take 2,000 Units by mouth daily.    [provider]  diltiazem (CARDIZEM CD) 180 MG 24 hr capsule Take 1 capsule (180 mg total) by mouth daily. 06/18/22   Ronney Asters, NP  furosemide (LASIX) 20 MG tablet Take 1 tablet (20  mg total) by mouth daily as needed. 03/19/22   Ronney Asters, NP  metoprolol tartrate (LOPRESSOR) 25 MG tablet Take 1 tablet (25 mg total) by mouth 2 (two) times daily. 06/18/22   Ronney Asters, NP  Multiple Vitamin (MULTIVITAMIN WITH MINERALS) TABS tablet Take 1 tablet by mouth daily.    [provider]      Allergies    Patient has no known allergies.    Review of Systems   Review of Systems  Constitutional:  Negative for fever.  Respiratory:  Negative for shortness of breath.   Cardiovascular:  Positive for palpitations. Negative for chest pain and leg swelling.  Neurological:  Negative for dizziness, syncope, weakness and light-headedness.  All other systems reviewed and are negative.   Physical Exam Updated Vital Signs BP 101/67   Pulse (!) 47   Temp 98.6 F (37 C)   Resp 19   Ht 5\' 4"  (1.626 m)   Wt 97.5 kg   SpO2 99%   BMI 36.90 kg/m  Physical Exam Vitals and nursing note reviewed.  Constitutional:      General: She is not in acute distress.    Appearance: Normal appearance. She is not ill-appearing, toxic-appearing or diaphoretic.  HENT:     Head: Normocephalic and atraumatic.     Nose: Nose normal.     Mouth/Throat:     Mouth: Mucous membranes are moist.     Pharynx: Oropharynx is clear.  Eyes:     Extraocular Movements: Extraocular movements intact.     Conjunctiva/sclera: Conjunctivae normal.     Pupils: Pupils are equal, round, and reactive to light.  Cardiovascular:     Rate and Rhythm: Tachycardia present. Rhythm irregular.  Pulmonary:     Effort: Pulmonary effort is normal.     Breath sounds: Normal breath sounds. No wheezing.  Abdominal:     General: Abdomen is flat. Bowel sounds are normal.     Palpations: Abdomen is soft.     Tenderness: There is no abdominal tenderness.  Musculoskeletal:     Cervical back: Normal range of motion and neck supple.     Right lower leg: No edema.     Left lower leg: No edema.  Skin:    General:  Skin is warm and dry.     Capillary Refill: Capillary refill takes less than 2 seconds.  Neurological:     Mental Status: She is alert and oriented to person, place, and time.     ED Results / Procedures / Treatments   Labs (all labs ordered are listed, but only abnormal results are displayed) Labs Reviewed  CBC - Abnormal; Notable for the following components:      Result Value   Hemoglobin 15.1 (*)    MCV 101.1 (*)    All other components within normal limits  BASIC METABOLIC PANEL - Abnormal; Notable for the following components:   Glucose, Bld 108 (*)    BUN <5 (*)    Creatinine, Ser <0.30 (*)    All other components within normal limits  BRAIN NATRIURETIC PEPTIDE  TROPONIN I (HIGH SENSITIVITY)  TROPONIN I (HIGH SENSITIVITY)    EKG None  Radiology DG Chest 2 View  Result Date: 10/20/2022 CLINICAL DATA:  Atrial fibrillation. EXAM: CHEST - 2 VIEW COMPARISON:  July 19, 2022. FINDINGS: Stable cardiomegaly. Both lungs are clear. The visualized skeletal structures are unremarkable. IMPRESSION: No active cardiopulmonary disease. Electronically Signed   By: Lupita Raider M.D.   On: 10/20/2022 10:57    Procedures .Cardioversion  Date/Time: 10/20/2022 11:45 AM  Performed by: Al Decant, PA-C Authorized by: Al Decant, PA-C   Consent:    Consent obtained:  Written   Consent given by:  Patient   Risks discussed:  Cutaneous burn, induced arrhythmia, death and pain   Alternatives discussed:  Alternative treatment Pre-procedure details:    Cardioversion basis:  Emergent   Rhythm:  Atrial fibrillation   Electrode placement:  Anterior-posterior Patient sedated: Yes. Refer to sedation procedure documentation for details of sedation.  Attempt one:    Cardioversion mode:  Synchronous   Shock (Joules):  200   Shock outcome:  Conversion to normal sinus rhythm Post-procedure details:    Patient status:  Alert   Patient tolerance of procedure:  Tolerated  well, no immediate complications    Medications Ordered in ED Medications  propofol (DIPRIVAN) 10 mg/mL bolus/IV push 48.8 mg (48.8 mg Intravenous Given 10/20/22 1147)  propofol (DIPRIVAN) 10 mg/mL bolus/IV push (48.75 mg Intravenous Given 10/20/22 1143)  sodium chloride 0.9 % bolus 1,000 mL (1,000 mLs Intravenous New Bag/Given 10/20/22 1201)    ED Course/ Medical Decision Making/ A&P                           Medical Decision Making Amount and/or Complexity of Data Reviewed Labs: ordered. Radiology: ordered.   71 year old female presents to ED for evaluation of  A-fib with RVR.  Please see HPI for further details.  On my examination the patient is afebrile however she is in an irregular rhythm on the monitor and her heart rate is fluctuating between 110 bpm and 130 bpm.  The patient lung sounds are clear bilaterally, she is not hypoxic.  The patient abdomen is soft and compressible throughout.  The patient neurological examination shows no focal neurodeficits.  Patient nontoxic in appearance.  Due to patient complaint we will proceed with the following labs and imaging studies to include CBC, BMP, BNP, troponins x2, chest x-ray, EKG.  Patient CBC unremarkable.  The patient troponin was 4 and 4 respectively.  The patient BMP is unremarkable.  The patient chest x-ray shows no consolidations, effusions, cardiomegaly.  The patient EKG shows A-fib.  Patient currently denies any chest pain, shortness of breath, lightheadedness, dizziness, weakness.  Patient BNP has not resulted.  The patient does not appear volume overloaded, there is no effusions on her chest x-ray, there is no peripheral edema.  Patient does not have any adventitious lung sounds to suggest fluid on lungs.  After discussing with my attending Melene Plan, we have decided to cardiovert this patient.  The patient is agreeable to this plan and she has consented to such.  The patient has been anticoagulated for quite some  time.  Please see procedure note for cardioversion details.  After cardioversion, patient returned normal sinus rhythm.  The patient was noted to have a low blood pressure at this time along with bradycardia.  The patient denies any feelings of lightheadedness, dizziness, weakness or syncope.  The patient was given a fluid bolus at this time which her blood pressure responded well to.  The patient reports that her pulse rate typically ranges between 55 and 60.  Patient also states that she took 2 doses of metoprolol this morning which she has been advised to do by cardiology when she notes herself to be in A-fib RVR.  At this time, I placed referral to A-fib clinic as well as the patient's cardiologist so that she has close follow-up.  The patient was encouraged to return to the ED with any new or worsening signs or symptoms of chest pain or shortness of breath, lightheadedness, dizziness, weakness.  The patient voiced understanding of my instructions.  The patient had all of her questions answered prior to discharge.  The patient is stable for discharge.  CHA2DS2-VASc Score = 4   This indicates a 4.8% annual risk of stroke. The patient's score is based upon: CHF History: 1 HTN History: 1 Diabetes History: 0 Stroke History: 0 Vascular Disease History: 0 Age Score: 1 Gender Score: 1       Final Clinical Impression(s) / ED Diagnoses Final diagnoses:  Atrial fibrillation with RVR (HCC)    Rx / DC Orders ED Discharge Orders          Ordered    Ambulatory referral to Cardiology       Comments: If you have not heard from the Cardiology office within the next 72 hours please call [phone].   10/20/22 1308                 Al Decant, PA-C 10/20/22 1325    Melene Plan, DO 10/20/22 1332

## 2022-10-20 NOTE — ED Notes (Signed)
ED Provider at bedside. 

## 2022-10-20 NOTE — ED Notes (Signed)
Patient transported to X-ray 

## 2022-10-20 NOTE — ED Provider Notes (Signed)
.  Sedation  Date/Time: 10/20/2022 1:33 PM  Performed by: Melene Plan, DO Authorized by: Melene Plan, DO   Consent:    Consent obtained:  Verbal   Consent given by:  Patient   Risks discussed:  Allergic reaction, nausea and vomiting   Alternatives discussed:  Anxiolysis and analgesia without sedation Universal protocol:    Immediately prior to procedure, a time out was called: yes     Patient identity confirmed:  Arm band Indications:    Procedure performed:  Cardioversion   Procedure necessitating sedation performed by:  Different physician Pre-sedation assessment:    Time since last food or drink:  6   ASA classification: class 2 - patient with mild systemic disease     Mouth opening:  2 finger widths   Thyromental distance:  3 finger widths   Mallampati score:  II - soft palate, uvula, fauces visible   Neck mobility: normal     Pre-sedation assessments completed and reviewed: airway patency, cardiovascular function, hydration status, mental status, nausea/vomiting, pain level, respiratory function and temperature   Immediate pre-procedure details:    Reviewed: vital signs   Procedure details (see MAR for exact dosages):    Preoxygenation:  Nasal cannula   Sedation:  Propofol   Intended level of sedation: moderate (conscious sedation)   Analgesia:  None   Intra-procedure monitoring:  Blood pressure monitoring, continuous capnometry, cardiac monitor, continuous pulse oximetry, frequent LOC assessments and frequent vital sign checks   Intra-procedure events: none     Total Provider sedation time (minutes):  35 Post-procedure details:    Attendance: Constant attendance by certified staff until patient recovered     Recovery: Patient returned to pre-procedure baseline     Post-sedation assessments completed and reviewed: airway patency, cardiovascular function, hydration status, mental status, nausea/vomiting, pain level, respiratory function and temperature     Patient is stable for  discharge or admission: no     Procedure completion:  Tolerated well, no immediate complications     Melene Plan, DO 10/20/22 1334

## 2022-10-20 NOTE — ED Provider Triage Note (Signed)
Emergency Medicine Provider Triage Evaluation Note  [name] [name] , a 71 y.o. female  was evaluated in triage.  Pt complains of atrial fibrillation.  Patient reports that this morning around 12 AM she was awoken from her sleep because "my heart was beating out of my chest".  Patient states that her pulse has been fluctuating during the course of the morning.  Patient reports that she has A-fib and is currently taking Lopressor, Cardizem, Eliquis.  Patient reports last time she took these medications was this morning around 730.  The patient states she was recently admitted in April for A-fib RVR requiring cardioversion after 1 week.  The patient currently denies any shortness of breath, chest pain, lightheadedness, dizziness, weakness, syncope.  The patient denies any leg swelling.  Patient does have history of CHF, takes fluid pills as needed.  Review of Systems  Positive:  Negative:   Physical Exam  BP (!) 134/92 (BP Location: Left Arm)   Pulse 88   Temp 98.1 F (36.7 C) (Oral)   Resp 16   Ht 5\' 4"  (1.626 m)   Wt 97.5 kg   SpO2 98%   BMI 36.90 kg/m  Gen:   Awake, no distress   Resp:  Normal effort  MSK:   Moves extremities without difficulty  Other:    Medical Decision Making  Medically screening exam initiated at 9:35 AM.  Appropriate orders placed.  [name] [name] was informed that the remainder of the evaluation will be completed by another provider, this initial triage assessment does not replace that evaluation, and the importance of remaining in the ED until their evaluation is complete.     Jodean Lima, PA-C 10/20/22 [phone]

## 2022-10-20 NOTE — ED Triage Notes (Addendum)
Patient reports that she was asleep around midnight and her heart rate was 148. Patient states she took Lopressor at 0015 this AM. Patient states she repeated the lopressor at 0215 because her HR was fluctuating between 85-135.  Patient states she woke at 0730 and took her AM meds.  HR in triage  88-125.

## 2022-10-20 NOTE — Discharge Instructions (Addendum)
Return to the ED with any new or worsening signs or symptoms such as chest pain, shortness of breath, lightheadedness, dizziness, weakness, loss of consciousness Please follow-up with the A-fib clinic.  Please call and make an appointment to be seen on Monday. Please continue taking medications as prescribed. Please read attached guide concerning atrial fibrillation

## 2022-10-22 ENCOUNTER — Telehealth: Payer: Self-pay | Admitting: Internal Medicine

## 2022-10-22 MED ORDER — DILTIAZEM HCL ER COATED BEADS 180 MG PO CP24: 180.0000 mg | ORAL_CAPSULE | Freq: Every day | ORAL | 6 refills | Status: DC

## 2022-10-22 NOTE — Telephone Encounter (Signed)
*  STAT* If patient is at the pharmacy, call can be transferred to refill team.   1. Which medications need to be refilled? (please list name of each medication and dose if known)   diltiazem (CARDIZEM CD) 180 MG 24 hr capsule    2. Which pharmacy/location (including street and city if local pharmacy) is medication to be sent to?   [facility] - [place], [place] - [address]    3. Do they need a 30 day or 90 day supply? 90

## 2022-10-29 ENCOUNTER — Ambulatory Visit (HOSPITAL_COMMUNITY)
Admission: RE | Admit: 2022-10-29 | Discharge: 2022-10-29 | Disposition: A | Discharge status: Home / Self Care | Payer: No Typology Code available for payment source | Source: Ambulatory Visit | Attending: Physician Assistant | Admitting: Physician Assistant

## 2022-10-29 VITALS — BP 158/86 | HR 56 | Ht 64.0 in | Wt 218.4 lb

## 2022-10-29 DIAGNOSIS — Z6837 Body mass index (BMI) 37.0-37.9, adult: Secondary | ICD-10-CM | POA: Diagnosis not present

## 2022-10-29 DIAGNOSIS — Z7901 Long term (current) use of anticoagulants: Secondary | ICD-10-CM | POA: Diagnosis not present

## 2022-10-29 DIAGNOSIS — E669 Obesity, unspecified: Secondary | ICD-10-CM | POA: Diagnosis not present

## 2022-10-29 DIAGNOSIS — R0683 Snoring: Secondary | ICD-10-CM | POA: Insufficient documentation

## 2022-10-29 DIAGNOSIS — D6869 Other thrombophilia: Secondary | ICD-10-CM | POA: Insufficient documentation

## 2022-10-29 DIAGNOSIS — I4819 Other persistent atrial fibrillation: Secondary | ICD-10-CM | POA: Insufficient documentation

## 2022-10-29 DIAGNOSIS — I110 Hypertensive heart disease with heart failure: Secondary | ICD-10-CM | POA: Diagnosis not present

## 2022-10-29 DIAGNOSIS — Z79899 Other long term (current) drug therapy: Secondary | ICD-10-CM | POA: Insufficient documentation

## 2022-10-29 NOTE — Progress Notes (Signed)
Primary Care Physician: Mirna Mires, MD Primary Cardiologist: Dr Rennis Golden Primary Electrophysiologist: none Referring Physician: [facility]   [name] [name] is a 71 y.o. female with a history of HTN, asthma, atrial fibrillation who presents for consultation in the [facility].  The patient was initially diagnosed with atrial fibrillation in 2017. She had done well until 02/2022 when she was admitted for incidentally found afib with RVR from the dentist office. She was discharged in rate controlled afib but was readmitted 03/11/22 with afib and CHF. She underwent DCCV at that time. Patient is on Eliquis for a CHADS2VASC score of 3. She was seen in the ED 10/20/22 for afib and underwent DCCV again. Today, she feels well in SR. She states that her afib mostly wakes her at night with tachypalpitations. She does admit to snoring and witnessed apnea. She denies significant alcohol use.   Today, she denies symptoms of palpitations, chest pain, shortness of breath, orthopnea, PND, lower extremity edema, dizziness, presyncope, syncope, bleeding, or neurologic sequela. The patient is tolerating medications without difficulties and is otherwise without complaint today.    Atrial Fibrillation Risk Factors:  she does have symptoms or diagnosis of sleep apnea. she is agreeable to sleep study.  she does not have a history of rheumatic fever. she does not have a history of alcohol use. The patient does have a history of early familial atrial fibrillation or other arrhythmias. Mother had afib.  she has a BMI of Body mass index is 37.49 kg/m.Marland Kitchen Filed Weights   10/29/22 1434  Weight: 99.1 kg    Family History  Problem Relation Age of Onset   Atrial fibrillation Mother    Hypertension Mother    Asthma Father    Heart attack Father      Atrial Fibrillation Management history:  Previous antiarrhythmic drugs: none Previous cardioversions: 03/15/22, 10/20/22 Previous  ablations: none CHADS2VASC score: 3 Anticoagulation history: Eliquis   Past Medical History:  Diagnosis Date   A-fib (HCC)    Anemia    during pneumonia   Arthritis    Asthma    Hypertension    Past Surgical History:  Procedure Laterality Date   c section     1973 and 1983   CARDIOVERSION N/A 03/15/2022   Procedure: CARDIOVERSION;  Surgeon: [name], MD;  Location: [facility] ENDOSCOPY;  Service: Cardiovascular;  Laterality: N/A;  1340 shocked @120j , NSR   COLONOSCOPY     ORIF ANKLE FRACTURE Left 08/16/2016   ORIF ANKLE FRACTURE Left 08/16/2016   Procedure: OPEN REDUCTION INTERNAL FIXATION (ORIF) LEFT BIMALLEOLAR ANKLE FRACTURE;  Surgeon: 08/18/2016, MD;  Location: [facility] OR;  Service: Orthopedics;  Laterality: Left;   TUBAL LIGATION      Current Outpatient Medications  Medication Sig Dispense Refill   acetaminophen (TYLENOL) 500 MG tablet Take 1,000 mg by mouth every 6 (six) hours as needed for mild pain.     apixaban (ELIQUIS) 5 MG TABS tablet Take 1 tablet (5 mg total) by mouth 2 (two) times daily. 180 tablet 1   APPLE CIDER VINEGAR PO Take 1 tablet by mouth every morning.     busPIRone (BUSPAR) 5 MG tablet Take 5 mg by mouth 2 (two) times daily.      cholecalciferol (VITAMIN D) 1000 units tablet Take 2,000 Units by mouth daily.     diltiazem (CARDIZEM CD) 180 MG 24 hr capsule Take 1 capsule (180 mg total) by mouth daily. 30 capsule 6  furosemide (LASIX) 20 MG tablet Take 1 tablet (20 mg total) by mouth daily as needed. 30 tablet 6   Magnesium 400 MG CAPS Take 1 tablet by mouth every morning.     metoprolol tartrate (LOPRESSOR) 25 MG tablet Take 1 tablet (25 mg total) by mouth 2 (two) times daily. 60 tablet 6   Multiple Vitamin (MULTIVITAMIN WITH MINERALS) TABS tablet Take 1 tablet by mouth daily.     No current facility-administered medications for this encounter.    No Known Allergies  Social History   Socioeconomic History   Marital status: Single     Spouse name: Not on file   Number of children: Not on file   Years of education: Not on file   Highest education level: Not on file  Occupational History   Not on file  Tobacco Use   Smoking status: Never   Smokeless tobacco: Never  Vaping Use   Vaping Use: Never used  Substance and Sexual Activity   Alcohol use: No   Drug use: No   Sexual activity: Not Currently  Other Topics Concern   Not on file  Social History Narrative   Not on file   Social Determinants of Health   Financial Resource Strain: Not on file  Food Insecurity: Not on file  Transportation Needs: Not on file  Physical Activity: Not on file  Stress: Not on file  Social Connections: Not on file  Intimate Partner Violence: Not on file     ROS- All systems are reviewed and negative except as per the HPI above.  Physical Exam: Vitals:   10/29/22 1434  BP: (!) 158/86  Pulse: (!) 56  Weight: 99.1 kg  Height: 5\' 4"  (1.626 m)    GEN- The patient is a well appearing obese female, alert and oriented x 3 today.   Head- normocephalic, atraumatic Eyes-  Sclera clear, conjunctiva pink Ears- hearing intact Oropharynx- clear Neck- supple  Lungs- Clear to ausculation bilaterally, normal work of breathing Heart- Regular rate and rhythm, no murmurs, rubs or gallops  GI- soft, NT, ND, + BS Extremities- no clubbing, cyanosis, or edema MS- no significant deformity or atrophy Skin- no rash or lesion Psych- euthymic mood, full affect Neuro- strength and sensation are intact  Wt Readings from Last 3 Encounters:  10/29/22 99.1 kg  10/20/22 97.5 kg  07/19/22 97.1 kg    EKG today demonstrates  SB Vent. rate 56 BPM PR interval 194 ms QRS duration 78 ms QT/QTcB 464/447 ms  Echo 03/01/22 demonstrated   1. Left ventricular ejection fraction, by estimation, is 60 to 65%. Left  ventricular ejection fraction by 2D MOD biplane is 61.6 %. The left  ventricle has normal function. The left ventricle has no regional wall   motion abnormalities. There is mild left ventricular hypertrophy. Left ventricular diastolic function could not be evaluated.   2. Right ventricular systolic function is normal. The right ventricular  size is normal. There is mildly elevated pulmonary artery systolic  pressure. The estimated right ventricular systolic pressure is 36.3 mmHg.   3. Left atrial size was moderately dilated.   4. The mitral valve is grossly normal. Mild mitral valve regurgitation.   5. The aortic valve is tricuspid. Aortic valve regurgitation is trivial.  Aortic valve sclerosis is present, with no evidence of aortic valve  stenosis. Aortic regurgitation PHT measures 518 msec.   6. The inferior vena cava is dilated in size with >50% respiratory  variability, suggesting right atrial pressure of  8 mmHg.   Epic records are reviewed at length today  CHA2DS2-VASc Score = 4  The patient's score is based upon: CHF History: 1 HTN History: 1 Diabetes History: 0 Stroke History: 0 Vascular Disease History: 0 Age Score: 1 Gender Score: 1       ASSESSMENT AND PLAN: 1. Persistent Atrial Fibrillation (ICD10:  I48.19) The patient's CHA2DS2-VASc score is 4, indicating a 4.8% annual risk of stroke.   Patient has had multiple afib episodes requiring DCCV and hospitalization. We discussed rhythm control options today including AAD (flecainide, dofetilide) and ablation. She would like to discuss with Dr Rennis Golden before making a decision. ? If related to untreated OSA. Continue Eliquis 5 mg BID Continue diltiazem 180 mg daily Continue Lopressor 25 mg BID  2. Secondary Hypercoagulable State (ICD10:  D68.69) The patient is at significant risk for stroke/thromboembolism based upon her CHA2DS2-VASc Score of 4.  Continue Apixaban (Eliquis).   3. Obesity Body mass index is 37.49 kg/m. Lifestyle modification was discussed at length including regular exercise and weight reduction.  4. Snoring/witnessed apnea The importance of  adequate treatment of sleep apnea was discussed today in order to improve our ability to maintain sinus rhythm long term. Will refer for sleep study.    Follow up with Dr Rennis Golden as scheduled. AF clinic pending decision regarding rhythm control. If she would like ablation, would refer directly to EP.    Jorja Loa PA-C Afib Clinic [facility] [address] [place], [place] [address] [phone] 10/29/2022 3:06 PM

## 2022-11-05 ENCOUNTER — Emergency Department (HOSPITAL_COMMUNITY): Payer: No Typology Code available for payment source

## 2022-11-05 ENCOUNTER — Emergency Department (HOSPITAL_COMMUNITY)
Admission: EM | Admit: 2022-11-05 | Discharge: 2022-11-05 | Disposition: A | Discharge status: Home / Self Care | Payer: No Typology Code available for payment source | Attending: Emergency Medicine | Admitting: Emergency Medicine

## 2022-11-05 ENCOUNTER — Encounter (HOSPITAL_COMMUNITY): Payer: Self-pay

## 2022-11-05 ENCOUNTER — Other Ambulatory Visit: Payer: Self-pay

## 2022-11-05 DIAGNOSIS — I5032 Chronic diastolic (congestive) heart failure: Secondary | ICD-10-CM | POA: Insufficient documentation

## 2022-11-05 DIAGNOSIS — R002 Palpitations: Secondary | ICD-10-CM | POA: Diagnosis not present

## 2022-11-05 DIAGNOSIS — Z7901 Long term (current) use of anticoagulants: Secondary | ICD-10-CM | POA: Insufficient documentation

## 2022-11-05 DIAGNOSIS — Z79899 Other long term (current) drug therapy: Secondary | ICD-10-CM | POA: Insufficient documentation

## 2022-11-05 DIAGNOSIS — I4891 Unspecified atrial fibrillation: Secondary | ICD-10-CM | POA: Diagnosis not present

## 2022-11-05 DIAGNOSIS — I110 Hypertensive heart disease with heart failure: Secondary | ICD-10-CM | POA: Diagnosis not present

## 2022-11-05 DIAGNOSIS — J45909 Unspecified asthma, uncomplicated: Secondary | ICD-10-CM | POA: Insufficient documentation

## 2022-11-05 LAB — BASIC METABOLIC PANEL
Anion gap: 8 (ref 5–15)
BUN: 13 mg/dL (ref 8–23)
CO2: 23 mmol/L (ref 22–32)
Calcium: 9 mg/dL (ref 8.9–10.3)
Chloride: 109 mmol/L (ref 98–111)
Creatinine, Ser: 0.75 mg/dL (ref 0.44–1.00)
GFR, Estimated: >60 mL/min (ref >60)
Glucose, Bld: 130 mg/dL — ABNORMAL HIGH (ref 70–99)
Potassium: 3.9 mmol/L (ref 3.5–5.1)
Sodium: 140 mmol/L (ref 135–145)

## 2022-11-05 LAB — CBC
HCT: 42.8 % (ref 36.0–46.0)
Hemoglobin: 14.5 g/dL (ref 12.0–15.0)
MCH: 33.1 pg (ref 26.0–34.0)
MCHC: 33.9 g/dL (ref 30.0–36.0)
MCV: 97.7 fL (ref 80.0–100.0)
Platelets: 191 10*3/uL (ref 150–400)
RBC: 4.38 MIL/uL (ref 3.87–5.11)
RDW: 12.3 % (ref 11.5–15.5)
WBC: 6.1 10*3/uL (ref 4.0–10.5)
nRBC: 0 % (ref 0.0–0.2)

## 2022-11-05 LAB — TROPONIN I (HIGH SENSITIVITY)
Troponin I (High Sensitivity): 4 ng/L (ref <18)
Troponin I (High Sensitivity): 5 ng/L (ref <18)

## 2022-11-05 LAB — MAGNESIUM: Magnesium: 1.8 mg/dL (ref 1.7–2.4)

## 2022-11-05 NOTE — ED Provider Notes (Signed)
[facility]-EMERGENCY DEPT Provider Note   CSN: [id number] Arrival date & time: 11/05/22  0813     History  Chief Complaint  Patient presents with   Palpitations         [name] [name] is a 71 y.o. female with paroxysmal Afib on eliquis, HTN, asthma, chronic diastolic HF,  presents with palpitations.   Patient states that his exam this morning her heart rate woke her up.  She states it was in the 170s this morning.  Denies any associated lightheadedness, chest pain, though endorsed mild shortness of breath when it was happening.  Lasted until she was waiting in the waiting room, when the symptoms subsided, total duration several hours.  Denies any recent lower extremity edema, illnesses, fever/chills, bleeding, alcohol or drug use.  Been compliant with her Eliquis 5 mg BID, diltiazem 180 mg daily, Lopressor 25 mg twice daily.  Patient has a history of A-fib and this is happened before.  Per chart review she was admitted for cardioversion in April and was recently cardioverted in the ED on 10/20/2022 for A-fib with RVR.  She had follow-up with cardiology on 10/29/2022 who at that time considered starting her on an antiarrhythmics like flecainide or dofetilide versus ablation.  Patient stated that she would like to talk with her cardiologist Dr. Rennis Golden before making a decision and so no changes were made.  Palpitations      Home Medications Prior to Admission medications   Medication Sig Start Date End Date Taking? Authorizing Provider  acetaminophen (TYLENOL) 500 MG tablet Take 1,000 mg by mouth every 6 (six) hours as needed for mild pain.   Yes [provider]  apixaban (ELIQUIS) 5 MG TABS tablet Take 1 tablet (5 mg total) by mouth 2 (two) times daily. 06/18/22  Yes Cleaver, Thomasene Ripple, NP  APPLE CIDER VINEGAR PO Take 1 tablet by mouth every morning.   Yes [provider]  busPIRone (BUSPAR) 5 MG tablet Take 5 mg by mouth 2 (two) times daily.    Yes  [provider]  cholecalciferol (VITAMIN D) 1000 units tablet Take 2,000 Units by mouth daily.   Yes [provider]  diltiazem (CARDIZEM CD) 180 MG 24 hr capsule Take 1 capsule (180 mg total) by mouth daily. 10/22/22  Yes Hilty, Lisette Abu, MD  furosemide (LASIX) 20 MG tablet Take 1 tablet (20 mg total) by mouth daily as needed. Patient taking differently: Take 20 mg by mouth daily as needed for fluid or edema. 03/19/22  Yes Ronney Asters, NP  Magnesium 400 MG CAPS Take 400 mg by mouth every morning.   Yes [provider]  metoprolol tartrate (LOPRESSOR) 25 MG tablet Take 1 tablet (25 mg total) by mouth 2 (two) times daily. 06/18/22  Yes Cleaver, Thomasene Ripple, NP  Multiple Vitamin (MULTIVITAMIN WITH MINERALS) TABS tablet Take 1 tablet by mouth daily.   Yes [provider]      Allergies    Patient has no known allergies.    Review of Systems   Review of Systems  Cardiovascular:  Positive for palpitations.   Review of systems Negative for recent illnesses.  A 10 point review of systems was performed and is negative unless otherwise reported in HPI.  Physical Exam Updated Vital Signs BP (!) 118/95   Pulse 68   Temp 97.9 F (36.6 C) (Oral)   Resp 17   SpO2 95%  Physical Exam General: Normal appearing female, lying in bed.  HEENT: Sclera anicteric, MMM, trachea midline.  Cardiology: Irregular rhythm, normal rate, no murmurs/rubs/gallops. BL radial and DP pulses equal bilaterally.  Resp: Normal respiratory rate and effort. CTAB, no wheezes, rhonchi, crackles.  Abd: Soft, non-tender, non-distended. No rebound tenderness or guarding.  GU: Deferred. MSK: No peripheral edema or signs of trauma. Extremities without deformity or TTP. No cyanosis or clubbing. Skin: warm, dry. No rashes or lesions. Neuro: A&Ox4, CNs II-XII grossly intact. MAEs. Sensation grossly intact.  Psych: Normal mood and affect.   ED Results / Procedures / Treatments   Labs (all labs  ordered are listed, but only abnormal results are displayed) Labs Reviewed  BASIC METABOLIC PANEL - Abnormal; Notable for the following components:      Result Value   Glucose, Bld 130 (*)    All other components within normal limits  CBC  MAGNESIUM  TROPONIN I (HIGH SENSITIVITY)  TROPONIN I (HIGH SENSITIVITY)    EKG EKG Interpretation  Date/Time:  Monday November 05 2022 08:23:57 EST Ventricular Rate:  137 PR Interval:    QRS Duration: 86 QT Interval:  327 QTC Calculation: 494 R Axis:   -2 Text Interpretation: Atrial fibrillation with rapid ventricular response Left axis deviation Confirmed by Vivi Barrack [phone]) on 11/05/2022 8:50:39 AM  Radiology DG Chest 2 View  Result Date: 11/05/2022 CLINICAL DATA:  Palpitations EXAM: CHEST - 2 VIEW COMPARISON:  Chest radiograph dated 10/20/2022 FINDINGS: Normal lung volumes. No focal consolidations. No pleural effusion or pneumothorax. Similar mildly enlarged cardiomediastinal silhouette. The visualized skeletal structures are unremarkable. IMPRESSION: No active cardiopulmonary disease. Electronically Signed   By: Agustin Cree M.D.   On: 11/05/2022 08:36    Procedures Procedures    Medications Ordered in ED Medications - No data to display  ED Course/ Medical Decision Making/ A&P                          Medical Decision Making Amount and/or Complexity of Data Reviewed Labs: ordered. Radiology: ordered.    This patient presents to the ED for concern of palpitations, this involves an extensive number of treatment options, and is a complaint that carries with it a high risk of complications and morbidity.  I considered the following differential and admission for this acute, potentially life threatening condition.  However, patient is currently very well-appearing and at her baseline.  She is hemodynamically stable and rate controlled A-fib on the monitor  MDM:    Patient with most likely Afib w/ RVR this AM that is now rate  controlled in the 80s-90s bpm on telemetry and EKG. Consider other possible arrhythmias such as SVT, aflutter, or VT, though no evidence of those here now. No signs of ischemia on EKG. Patient has had multiple cardioversions for afib and recently saw cards where they considered changing medications. Patient now feels at her baseline, low c/f HF, ACS, or infection causing her symptoms. Will evaluate for electrolyte abnormalities, anemia, dehydration, renal injury.  Clinical Course as of 11/05/22 1135  Mon Nov 05, 2022  1040 Pulse Rate: 72 [HN]  1040 W/u including CBC, BMP, Mg, and trop unremarkable [HN]  1058 Patient feels at her baseline. Rate controlled. Do not believe she requires DCCV at this time. Given recent consideration of changes to meds by cardiology, will consult to cards for recs [HN]  1125 Cardiology states that would not start flecainide/dofetilide here in ED. If rates increase again at home, can instruct patient to take an extra  25 mg immediate release metoprolol. Can get her back for f/u appt soon to discuss possible flecainide pill in pocket or ablation. [HN]  1134 Discussed with patient cardiology recommendations.  Follow-up as already been scheduled for 11/07/2022 at 08 30.  Patient is currently hemodynamically stable, well appearing and feels at her baseline.  Patient reports understanding, all questions answered to patient satisfaction. [HN]    Clinical Course User Index [HN] Loetta Rough, MD    Labs: I Ordered, and personally interpreted labs.  The pertinent results include: Those listed above  Imaging Studies ordered: I ordered imaging studies including chest x-ray which was within normal limits I independently visualized and interpreted imaging. I agree with the radiologist interpretation  Additional history obtained from friend at bedside and chart review.    Cardiac Monitoring: The patient was maintained on a cardiac monitor.  I personally viewed and interpreted  the cardiac monitored which showed an underlying rhythm of: Rate controlled A-fib  Reevaluation: I reevaluated the patient and found that they have resolved  Social Determinants of Health:  patient lives independently  Disposition: Discharged with discharge directions, return precautions, instructions to take Lopressor 25 mg as needed in the event of A-fib with RVR,, and scheduled cardiology follow-up on 11/07/2022 at 8:30 in the morning  Co morbidities that complicate the patient evaluation  Past Medical History:  Diagnosis Date   A-fib (HCC)    Anemia    during pneumonia   Arthritis    Asthma    Hypertension      Medicines No orders of the defined types were placed in this encounter.   I have reviewed the patients home medicines and have made adjustments as needed  Problem List / ED Course: Problem List Items Addressed This Visit       Cardiovascular and Mediastinum   Atrial fibrillation with RVR (HCC)   Other Visit Diagnoses     Palpitations    -  Primary                This note was created using dictation software, which may contain spelling or grammatical errors.    Loetta Rough, MD 11/05/22 1135

## 2022-11-05 NOTE — ED Provider Triage Note (Signed)
Emergency Medicine Provider Triage Evaluation Note  [name] [name] , a 71 y.o. female  was evaluated in triage.  Pt complains of heart racing since this morning around 6 AM.  She says when she measured her heart rate was up into the 180s.  She says she took all of her home medications at this time including her Eliquis, diltiazem, and metoprolol.  He has been compliant with all of these medications.  She recently had to be cardioverted a couple weeks ago.   Review of Systems  Positive:  Negative:   Physical Exam  BP (!) 129/101 (BP Location: Left Arm)   Pulse (!) 108   Resp 18   SpO2 94%  Gen:   Awake, no distress   Resp:  Normal effort  MSK:   Moves extremities without difficulty  Other:    Medical Decision Making  Medically screening exam initiated at 8:33 AM.  Appropriate orders placed.  [name] [name] was informed that the remainder of the evaluation will be completed by another provider, this initial triage assessment does not replace that evaluation, and the importance of remaining in the ED until their evaluation is complete.     Claudie Leach, [place] 11/05/22 [phone]

## 2022-11-05 NOTE — ED Triage Notes (Signed)
Patient said she has atrial fibrillation since 2017. Takes eloquis, lopressor and diltiazem. Feels like her heart is beating too fast.

## 2022-11-05 NOTE — Discharge Instructions (Addendum)
Thank you for coming to [facility] Emergency Department. You were seen for palpitations and concern for atrial fibrillation with RVR. We did an exam, labs, and imaging, and these showed no acute findings. We discussed cardiology who stated that if you experience the symptoms again, please take an extra dose of your 25 mg Lopressor. Please follow up with your cardiologist on 12/13 at 8:30 in the morning.   Do not hesitate to return to the ED or call 911 if you experience: -Worsening symptoms -Chest pain, shortness of breath -Lightheadedness, passing out -Fevers/chills -Anything else that concerns you

## 2022-11-05 NOTE — ED Notes (Signed)
Blue top sent down to lab °

## 2022-11-07 ENCOUNTER — Ambulatory Visit (HOSPITAL_COMMUNITY)
Admit: 2022-11-07 | Discharge: 2022-11-07 | Disposition: A | Discharge status: Home / Self Care | Payer: No Typology Code available for payment source | Source: Ambulatory Visit | Attending: Physician Assistant | Admitting: Physician Assistant

## 2022-11-07 ENCOUNTER — Encounter (HOSPITAL_COMMUNITY): Payer: Self-pay | Admitting: Physician Assistant

## 2022-11-07 VITALS — BP 110/90 | HR 106 | Ht 64.0 in | Wt 216.8 lb

## 2022-11-07 DIAGNOSIS — R0683 Snoring: Secondary | ICD-10-CM | POA: Diagnosis not present

## 2022-11-07 DIAGNOSIS — I110 Hypertensive heart disease with heart failure: Secondary | ICD-10-CM | POA: Diagnosis not present

## 2022-11-07 DIAGNOSIS — I4819 Other persistent atrial fibrillation: Secondary | ICD-10-CM | POA: Diagnosis not present

## 2022-11-07 DIAGNOSIS — D6869 Other thrombophilia: Secondary | ICD-10-CM | POA: Insufficient documentation

## 2022-11-07 DIAGNOSIS — Z79899 Other long term (current) drug therapy: Secondary | ICD-10-CM | POA: Insufficient documentation

## 2022-11-07 DIAGNOSIS — Z6837 Body mass index (BMI) 37.0-37.9, adult: Secondary | ICD-10-CM | POA: Diagnosis not present

## 2022-11-07 DIAGNOSIS — Z7182 Exercise counseling: Secondary | ICD-10-CM | POA: Insufficient documentation

## 2022-11-07 DIAGNOSIS — E669 Obesity, unspecified: Secondary | ICD-10-CM | POA: Insufficient documentation

## 2022-11-07 DIAGNOSIS — I509 Heart failure, unspecified: Secondary | ICD-10-CM | POA: Diagnosis not present

## 2022-11-07 DIAGNOSIS — I480 Paroxysmal atrial fibrillation: Secondary | ICD-10-CM | POA: Diagnosis not present

## 2022-11-07 DIAGNOSIS — Z7901 Long term (current) use of anticoagulants: Secondary | ICD-10-CM | POA: Insufficient documentation

## 2022-11-07 MED ORDER — FLECAINIDE ACETATE 50 MG PO TABS: 50.0000 mg | ORAL_TABLET | Freq: Two times a day (BID) | ORAL | 3 refills | Status: DC

## 2022-11-07 MED ORDER — METOPROLOL TARTRATE 25 MG PO TABS: 37.5000 mg | ORAL_TABLET | Freq: Two times a day (BID) | ORAL | 6 refills | Status: DC

## 2022-11-07 NOTE — Patient Instructions (Addendum)
Start flecainide to 50mg  twice a day  Increase metoprolol to 37.5mg  twice a day (1 and 1/2 tablets twice a day)   Cardioversion scheduled for: Thursday, December 28th   - Arrive at the December 30 and go to admitting at 830am   - Do not eat or drink anything after midnight the night prior to your procedure.   - Take all your morning medication (except diabetic medications) with a sip of water prior to arrival. - You will not be able to drive home after your procedure.    - Do NOT miss any doses of your blood thinner - if you should miss a dose please notify our office immediately.   - If you feel as if you go back into normal rhythm prior to scheduled cardioversion, please notify our office immediately.  If your procedure is canceled in the cardioversion suite you will be charged a cancellation fee.

## 2022-11-07 NOTE — Progress Notes (Signed)
Primary Care Physician: Mirna Mires, MD Primary Cardiologist: Dr Rennis Golden Primary Electrophysiologist: none Referring Physician: [facility]   [name] [name] is a 71 y.o. female with a history of HTN, asthma, atrial fibrillation who presents for follow up in the [facility].  The patient was initially diagnosed with atrial fibrillation in 2017. She had done well until 02/2022 when she was admitted for incidentally found afib with RVR from the dentist office. She was discharged in rate controlled afib but was readmitted 03/11/22 with afib and CHF. She underwent DCCV at that time. Patient is on Eliquis for a CHADS2VASC score of 3. She was seen in the ED 10/20/22 for afib and underwent DCCV again. She does admit to snoring and witnessed apnea. She denies significant alcohol use. At her visit in the AF clinic 10/29/22 she deferred decision about rhythm control until her visit with Dr Rennis Golden.  Patient was seen again at that ED 11/05/22 with tachypalpitations. She was rate controlled in the ED and did not undergo DCCV. Patient remains in afib today. She did take two extra doses of Lopressor yesterday which helped to slow her rates to 80s-90s. She has taken only one dose this AM. There were no specific triggers for her afib that she could identify.   Today, she denies symptoms of chest pain, shortness of breath, orthopnea, PND, lower extremity edema, dizziness, presyncope, syncope, bleeding, or neurologic sequela. The patient is tolerating medications without difficulties and is otherwise without complaint today.    Atrial Fibrillation Risk Factors:  she does have symptoms or diagnosis of sleep apnea. she is agreeable to sleep study.  she does not have a history of rheumatic fever. she does not have a history of alcohol use. The patient does have a history of early familial atrial fibrillation or other arrhythmias. Mother had afib.  she has a BMI of Body mass index is 37.21  kg/m.Marland Kitchen Filed Weights   11/07/22 0842  Weight: 98.3 kg    Family History  Problem Relation Age of Onset   Atrial fibrillation Mother    Hypertension Mother    Asthma Father    Heart attack Father      Atrial Fibrillation Management history:  Previous antiarrhythmic drugs: none Previous cardioversions: 03/15/22, 10/20/22 Previous ablations: none CHADS2VASC score: 3 Anticoagulation history: Eliquis   Past Medical History:  Diagnosis Date   A-fib (HCC)    Anemia    during pneumonia   Arthritis    Asthma    Hypertension    Past Surgical History:  Procedure Laterality Date   c section     1973 and 1983   CARDIOVERSION N/A 03/15/2022   Procedure: CARDIOVERSION;  Surgeon: [name], MD;  Location: [facility] ENDOSCOPY;  Service: Cardiovascular;  Laterality: N/A;  1340 shocked @120j , NSR   COLONOSCOPY     ORIF ANKLE FRACTURE Left 08/16/2016   ORIF ANKLE FRACTURE Left 08/16/2016   Procedure: OPEN REDUCTION INTERNAL FIXATION (ORIF) LEFT BIMALLEOLAR ANKLE FRACTURE;  Surgeon: 08/18/2016, MD;  Location: [facility] OR;  Service: Orthopedics;  Laterality: Left;   TUBAL LIGATION      Current Outpatient Medications  Medication Sig Dispense Refill   acetaminophen (TYLENOL) 500 MG tablet Take 1,000 mg by mouth every 6 (six) hours as needed for mild pain.     apixaban (ELIQUIS) 5 MG TABS tablet Take 1 tablet (5 mg total) by mouth 2 (two) times daily. 180 tablet 1   APPLE CIDER VINEGAR PO  Take 1 tablet by mouth every morning.     busPIRone (BUSPAR) 5 MG tablet Take 5 mg by mouth 2 (two) times daily.      cholecalciferol (VITAMIN D) 1000 units tablet Take 2,000 Units by mouth daily.     diltiazem (CARDIZEM CD) 180 MG 24 hr capsule Take 1 capsule (180 mg total) by mouth daily. 30 capsule 6   furosemide (LASIX) 20 MG tablet Take 1 tablet (20 mg total) by mouth daily as needed. 30 tablet 6   Magnesium 400 MG CAPS Take 400 mg by mouth every morning.     metoprolol tartrate (LOPRESSOR)  25 MG tablet Take 1 tablet (25 mg total) by mouth 2 (two) times daily. 60 tablet 6   Multiple Vitamin (MULTIVITAMIN WITH MINERALS) TABS tablet Take 1 tablet by mouth daily.     No current facility-administered medications for this encounter.    No Known Allergies  Social History   Socioeconomic History   Marital status: Single    Spouse name: Not on file   Number of children: Not on file   Years of education: Not on file   Highest education level: Not on file  Occupational History   Not on file  Tobacco Use   Smoking status: Never   Smokeless tobacco: Never   Tobacco comments:    Never smoke 11/07/22  Vaping Use   Vaping Use: Never used  Substance and Sexual Activity   Alcohol use: No   Drug use: No   Sexual activity: Not Currently  Other Topics Concern   Not on file  Social History Narrative   Not on file   Social Determinants of Health   Financial Resource Strain: Not on file  Food Insecurity: Not on file  Transportation Needs: Not on file  Physical Activity: Not on file  Stress: Not on file  Social Connections: Not on file  Intimate Partner Violence: Not on file     ROS- All systems are reviewed and negative except as per the HPI above.  Physical Exam: Vitals:   11/07/22 0842  BP: (!) 110/90  Pulse: (!) 106  Weight: 98.3 kg  Height: 5\' 4"  (1.626 m)     GEN- The patient is a well appearing obese female, alert and oriented x 3 today.   HEENT-head normocephalic, atraumatic, sclera clear, conjunctiva pink, hearing intact, trachea midline. Lungs- Clear to ausculation bilaterally, normal work of breathing Heart- irregular rate and rhythm, no murmurs, rubs or gallops  GI- soft, NT, ND, + BS Extremities- no clubbing, cyanosis, or edema MS- no significant deformity or atrophy Skin- no rash or lesion Psych- euthymic mood, full affect Neuro- strength and sensation are intact   Wt Readings from Last 3 Encounters:  11/07/22 98.3 kg  10/29/22 99.1 kg   10/20/22 97.5 kg    EKG today demonstrates  Atypical flutter with variable block vs coarse afib Vent. rate 106 BPM PR interval * ms QRS duration 76 ms QT/QTcB 364/483 ms  Echo 03/01/22 demonstrated   1. Left ventricular ejection fraction, by estimation, is 60 to 65%. Left  ventricular ejection fraction by 2D MOD biplane is 61.6 %. The left  ventricle has normal function. The left ventricle has no regional wall  motion abnormalities. There is mild left ventricular hypertrophy. Left ventricular diastolic function could not be evaluated.   2. Right ventricular systolic function is normal. The right ventricular  size is normal. There is mildly elevated pulmonary artery systolic  pressure. The estimated right ventricular  systolic pressure is 36.3 mmHg.   3. Left atrial size was moderately dilated.   4. The mitral valve is grossly normal. Mild mitral valve regurgitation.   5. The aortic valve is tricuspid. Aortic valve regurgitation is trivial.  Aortic valve sclerosis is present, with no evidence of aortic valve  stenosis. Aortic regurgitation PHT measures 518 msec.   6. The inferior vena cava is dilated in size with >50% respiratory  variability, suggesting right atrial pressure of 8 mmHg.   Epic records are reviewed at length today  CHA2DS2-VASc Score = 4  The patient's score is based upon: CHF History: 1 HTN History: 1 Diabetes History: 0 Stroke History: 0 Vascular Disease History: 0 Age Score: 1 Gender Score: 1       ASSESSMENT AND PLAN: 1. Persistent Atrial Fibrillation (ICD10:  I48.19) The patient's CHA2DS2-VASc score is 4, indicating a 4.8% annual risk of stroke.   Patient has had multiple afib episodes requiring DCCV and hospitalization. She is in persistent afib today. We discussed rhythm control options again today. Patient agreeable to consultation with EP for ablation, will refer. As a bridge to ablation, will start flecainide 50 mg BID. If she does not convert  chemically, will plan for DCCV. Suspect undiagnosed OSA playing a role.  Continue Eliquis 5 mg BID Continue diltiazem 180 mg daily Increase Lopressor to 37.5 mg BID for rate control.   2. Secondary Hypercoagulable State (ICD10:  D68.69) The patient is at significant risk for stroke/thromboembolism based upon her CHA2DS2-VASc Score of 4.  Continue Apixaban (Eliquis).   3. Obesity Body mass index is 37.21 kg/m. Lifestyle modification was discussed and encouraged including regular physical activity and weight reduction.  4. Snoring/witnessed apnea Patient referred for sleep study.    Follow up in the AF clinic next week for ECG after flecainide start.    Jorja Loa PA-C [facility] [address] [place], [place] [address] [phone] 11/07/2022 9:14 AM

## 2022-11-12 ENCOUNTER — Ambulatory Visit (HOSPITAL_COMMUNITY)
Admission: RE | Admit: 2022-11-12 | Discharge: 2022-11-12 | Disposition: A | Discharge status: Home / Self Care | Payer: No Typology Code available for payment source | Source: Ambulatory Visit | Attending: Physician Assistant | Admitting: Physician Assistant

## 2022-11-12 DIAGNOSIS — I4819 Other persistent atrial fibrillation: Secondary | ICD-10-CM | POA: Insufficient documentation

## 2022-11-12 DIAGNOSIS — Z79899 Other long term (current) drug therapy: Secondary | ICD-10-CM | POA: Insufficient documentation

## 2022-11-12 LAB — CBC
HCT: 43.8 % (ref 36.0–46.0)
Hemoglobin: 14.5 g/dL (ref 12.0–15.0)
MCH: 33.2 pg (ref 26.0–34.0)
MCHC: 33.1 g/dL (ref 30.0–36.0)
MCV: 100.2 fL — ABNORMAL HIGH (ref 80.0–100.0)
Platelets: 202 10*3/uL (ref 150–400)
RBC: 4.37 MIL/uL (ref 3.87–5.11)
RDW: 12.5 % (ref 11.5–15.5)
WBC: 7.9 10*3/uL (ref 4.0–10.5)
nRBC: 0 % (ref 0.0–0.2)

## 2022-11-12 LAB — BASIC METABOLIC PANEL
Anion gap: 7 (ref 5–15)
BUN: 11 mg/dL (ref 8–23)
CO2: 25 mmol/L (ref 22–32)
Calcium: 9 mg/dL (ref 8.9–10.3)
Chloride: 105 mmol/L (ref 98–111)
Creatinine, Ser: 0.98 mg/dL (ref 0.44–1.00)
GFR, Estimated: >60 mL/min (ref >60)
Glucose, Bld: 123 mg/dL — ABNORMAL HIGH (ref 70–99)
Potassium: 4 mmol/L (ref 3.5–5.1)
Sodium: 137 mmol/L (ref 135–145)

## 2022-11-12 NOTE — H&P (View-Only) (Signed)
Patient returns for ECG after starting flecainide. ECG shows:  Coarse afib Vent. rate 74 BPM PR interval * ms QRS duration 92 ms QT/QTcB 410/455 ms  Patient scheduled for DCCV on 11/22/22. F/u with Dr Nelly Laurence as scheduled.  Continue flecainide 50 mg BID

## 2022-11-12 NOTE — Progress Notes (Signed)
Patient returns for ECG after starting flecainide. ECG shows:  Coarse afib Vent. rate 74 BPM PR interval * ms QRS duration 92 ms QT/QTcB 410/455 ms  Patient scheduled for DCCV on 11/22/22. F/u with Dr Mealor as scheduled.  Continue flecainide 50 mg BID 

## 2022-11-22 ENCOUNTER — Ambulatory Visit (HOSPITAL_BASED_OUTPATIENT_CLINIC_OR_DEPARTMENT_OTHER): Payer: No Typology Code available for payment source | Admitting: Anesthesiology

## 2022-11-22 ENCOUNTER — Other Ambulatory Visit: Payer: Self-pay

## 2022-11-22 ENCOUNTER — Ambulatory Visit (HOSPITAL_COMMUNITY)
Admission: RE | Admit: 2022-11-22 | Discharge: 2022-11-22 | Disposition: A | Discharge status: Home / Self Care | Payer: No Typology Code available for payment source | Attending: Internal Medicine | Admitting: Internal Medicine

## 2022-11-22 ENCOUNTER — Encounter (HOSPITAL_COMMUNITY)
Admission: RE | Disposition: A | Discharge status: Home / Self Care | Payer: Self-pay | Source: Home / Self Care | Attending: Internal Medicine

## 2022-11-22 ENCOUNTER — Encounter (HOSPITAL_COMMUNITY): Payer: Self-pay | Admitting: Internal Medicine

## 2022-11-22 ENCOUNTER — Ambulatory Visit (HOSPITAL_COMMUNITY): Payer: No Typology Code available for payment source | Admitting: Anesthesiology

## 2022-11-22 DIAGNOSIS — I4891 Unspecified atrial fibrillation: Secondary | ICD-10-CM | POA: Insufficient documentation

## 2022-11-22 DIAGNOSIS — M1990 Unspecified osteoarthritis, unspecified site: Secondary | ICD-10-CM | POA: Diagnosis not present

## 2022-11-22 DIAGNOSIS — J45909 Unspecified asthma, uncomplicated: Secondary | ICD-10-CM | POA: Insufficient documentation

## 2022-11-22 DIAGNOSIS — I4819 Other persistent atrial fibrillation: Secondary | ICD-10-CM | POA: Diagnosis not present

## 2022-11-22 DIAGNOSIS — I110 Hypertensive heart disease with heart failure: Secondary | ICD-10-CM | POA: Insufficient documentation

## 2022-11-22 DIAGNOSIS — I509 Heart failure, unspecified: Secondary | ICD-10-CM | POA: Diagnosis not present

## 2022-11-22 HISTORY — PX: CARDIOVERSION: SHX1299

## 2022-11-22 SURGERY — CARDIOVERSION
Anesthesia: General

## 2022-11-22 MED ORDER — METOPROLOL TARTRATE 25 MG PO TABS: 25.0000 mg | ORAL_TABLET | Freq: Two times a day (BID) | ORAL | 6 refills | Status: DC

## 2022-11-22 MED ORDER — SODIUM CHLORIDE 0.9 % IV SOLN: INTRAVENOUS | Status: DC

## 2022-11-22 MED ORDER — LIDOCAINE 2% (20 MG/ML) 5 ML SYRINGE
INTRAMUSCULAR | Status: DC | PRN
  Administered 2022-11-22: 20 mg via INTRAVENOUS

## 2022-11-22 MED ORDER — EPHEDRINE SULFATE-NACL 50-0.9 MG/10ML-% IV SOSY
PREFILLED_SYRINGE | INTRAVENOUS | Status: DC | PRN
  Administered 2022-11-22: 10 mg via INTRAVENOUS

## 2022-11-22 MED ORDER — PROPOFOL 10 MG/ML IV BOLUS
INTRAVENOUS | Status: DC | PRN
  Administered 2022-11-22: 60 mg via INTRAVENOUS

## 2022-11-22 MED ORDER — PHENYLEPHRINE 80 MCG/ML (10ML) SYRINGE FOR IV PUSH (FOR BLOOD PRESSURE SUPPORT)
PREFILLED_SYRINGE | INTRAVENOUS | Status: DC | PRN
  Administered 2022-11-22: 80 ug via INTRAVENOUS

## 2022-11-22 NOTE — Discharge Instructions (Signed)

## 2022-11-22 NOTE — Anesthesia Preprocedure Evaluation (Signed)
Anesthesia Evaluation  Patient identified by MRN, date of birth, ID band Patient awake    Reviewed: Allergy & Precautions, NPO status , Patient's Chart, lab work & pertinent test results  Airway Mallampati: II  TM Distance: >3 FB     Dental   Pulmonary asthma    breath sounds clear to auscultation       Cardiovascular hypertension, Pt. on medications and Pt. on home beta blockers +CHF  + dysrhythmias Atrial Fibrillation  Rhythm:Irregular Rate:Normal     Neuro/Psych negative neurological ROS     GI/Hepatic negative GI ROS, Neg liver ROS,,,  Endo/Other  negative endocrine ROS    Renal/GU negative Renal ROS     Musculoskeletal  (+) Arthritis ,    Abdominal   Peds  Hematology negative hematology ROS (+)   Anesthesia Other Findings   Reproductive/Obstetrics                             Anesthesia Physical Anesthesia Plan  ASA: 3  Anesthesia Plan: General   Post-op Pain Management:    Induction: Intravenous  PONV Risk Score and Plan: 3 and Treatment may vary due to age or medical condition  Airway Management Planned: Mask and Natural Airway  Additional Equipment:   Intra-op Plan:   Post-operative Plan:   Informed Consent: I have reviewed the patients History and Physical, chart, labs and discussed the procedure including the risks, benefits and alternatives for the proposed anesthesia with the patient or authorized representative who has indicated his/her understanding and acceptance.       Plan Discussed with: CRNA  Anesthesia Plan Comments:        Anesthesia Quick Evaluation

## 2022-11-22 NOTE — Anesthesia Postprocedure Evaluation (Signed)
Anesthesia Post Note  Patient: [name] [name]  Procedure(s) Performed: CARDIOVERSION     Patient location during evaluation: PACU Anesthesia Type: General Level of consciousness: awake and alert Pain management: pain level controlled Vital Signs Assessment: post-procedure vital signs reviewed and stable Respiratory status: spontaneous breathing, nonlabored ventilation, respiratory function stable and patient connected to nasal cannula oxygen Cardiovascular status: blood pressure returned to baseline and stable Postop Assessment: no apparent nausea or vomiting Anesthetic complications: no  No notable events documented.  Last Vitals:  Vitals:   11/22/22 1000 11/22/22 1010  BP: 96/75 97/74  Pulse: (!) 47 (!) 46  Resp: 15 17  Temp:    SpO2: 94% 95%    Last Pain:  Vitals:   11/22/22 1010  TempSrc:   PainSc: 0-No pain                 Kennieth Rad

## 2022-11-22 NOTE — Interval H&P Note (Signed)
History and Physical Interval Note:  11/22/2022 8:49 AM  [name] [name]  has presented today for surgery, with the diagnosis of AFIB.  The various methods of treatment have been discussed with the patient and family. After consideration of risks, benefits and other options for treatment, the patient has consented to  Procedure(s): CARDIOVERSION (N/A) as a surgical intervention.  The patient's history has been reviewed, patient examined, no change in status, stable for surgery.  I have reviewed the patient's chart and labs.  Questions were answered to the patient's satisfaction.     Chrystie Nose

## 2022-11-22 NOTE — Transfer of Care (Signed)
Immediate Anesthesia Transfer of Care Note  Patient: [name] [name]  Procedure(s) Performed: CARDIOVERSION  Patient Location: Endoscopy Unit  Anesthesia Type:General  Level of Consciousness: awake, alert , and oriented  Airway & Oxygen Therapy: Patient Spontanous Breathing and Patient connected to face mask oxygen  Post-op Assessment: Report given to RN and Post -op Vital signs reviewed and stable  Post vital signs: Reviewed and stable  Last Vitals:  Vitals Value Taken Time  BP 100/71   Temp    Pulse 47   Resp 20   SpO2 98%     Last Pain:  Vitals:   11/22/22 0841  TempSrc: Temporal  PainSc: 0-No pain      Patients Stated Pain Goal: 3 (11/22/22 0841)  Complications: No notable events documented.

## 2022-11-22 NOTE — CV Procedure (Signed)
    CARDIOVERSION NOTE  Procedure: Electrical Cardioversion Indications:  Atrial Fibrillation  Procedure Details:  Consent: Risks of procedure as well as the alternatives and risks of each were explained to the (patient/caregiver).  Consent for procedure obtained.  Time Out: Verified patient identification, verified procedure, site/side was marked, verified correct patient position, special equipment/implants available, medications/allergies/relevent history reviewed, required imaging and test results available.  Performed  Patient placed on cardiac monitor, pulse oximetry, supplemental oxygen as necessary.  Sedation given:  propofol per anesthesia Pacer pads placed anterior and posterior chest.  Cardioverted 1 time(s).  Cardioverted at 150J biphasic.  Impression: Findings: Post procedure EKG shows:  sinus bradycardia Complications: None Patient did tolerate procedure well.  Plan: Successful DCCV with a single 150J biphasic shock to sinus bradycardia. Will decrease home metoprolol dose to 25 mg BID. Continue flecainide.  Time Spent Directly with the Patient:  30 minutes   Chrystie Nose, MD, [facility], FACP  [place]  [facility]  Medical Director of the [facility] &  Cardiovascular Risk Reduction Clinic Diplomate of the American Board of Clinical Lipidology Attending Cardiologist  Direct Dial: [phone]  Fax: [phone]  Website:  [email or web address] 11/22/2022, 9:46 AM

## 2022-11-24 ENCOUNTER — Encounter (HOSPITAL_COMMUNITY): Payer: Self-pay | Admitting: Internal Medicine

## 2022-11-28 ENCOUNTER — Telehealth (HOSPITAL_COMMUNITY): Payer: Self-pay | Admitting: *Deleted

## 2022-11-28 MED ORDER — METOPROLOL TARTRATE 25 MG PO TABS: 37.5000 mg | ORAL_TABLET | Freq: Two times a day (BID) | ORAL | 6 refills | Status: DC

## 2022-11-28 NOTE — Telephone Encounter (Signed)
Patient called in stating she woke up in the middle of the night to go to the bathroom and went back into AFib. Hrs in the 110s. Overall feels ok but notices the elevated rates. Discussed with Adline Peals PA will increase metoprolol back to 37.5mg  BID as it was decreased due to bradycardia post cardioversion last week. Pt has follow up with Dr. Myles Gip 1/15 to discuss ablation. Pt will call if issues arise prior to consultation.

## 2022-12-10 ENCOUNTER — Encounter: Payer: Self-pay | Admitting: Cardiovascular Disease

## 2022-12-10 ENCOUNTER — Ambulatory Visit
Payer: No Typology Code available for payment source | Attending: Cardiovascular Disease | Admitting: Cardiovascular Disease

## 2022-12-10 VITALS — BP 122/72 | HR 81 | Ht 63.0 in | Wt 218.0 lb

## 2022-12-10 DIAGNOSIS — Z01812 Encounter for preprocedural laboratory examination: Secondary | ICD-10-CM | POA: Diagnosis not present

## 2022-12-10 DIAGNOSIS — Z7901 Long term (current) use of anticoagulants: Secondary | ICD-10-CM | POA: Diagnosis not present

## 2022-12-10 DIAGNOSIS — D6869 Other thrombophilia: Secondary | ICD-10-CM

## 2022-12-10 DIAGNOSIS — I4819 Other persistent atrial fibrillation: Secondary | ICD-10-CM | POA: Diagnosis not present

## 2022-12-10 DIAGNOSIS — Z6838 Body mass index (BMI) 38.0-38.9, adult: Secondary | ICD-10-CM

## 2022-12-10 DIAGNOSIS — E669 Obesity, unspecified: Secondary | ICD-10-CM

## 2022-12-10 DIAGNOSIS — G478 Other sleep disorders: Secondary | ICD-10-CM

## 2022-12-10 NOTE — Progress Notes (Signed)
Electrophysiology Office Note:    Date:  12/10/2022   ID:  [name] [name], [name] [name] 01-16-1951, MRN [id number]  PCP:  Iona Beard, MD   [place] Providers Cardiologist:  Pixie Casino, MD Electrophysiologist:  Melida Quitter, MD     Referring MD: Oliver Barre, [place]   History of Present Illness:    [name] [name] is a 72 y.o. female with a hx listed below, significant for hypertension and atrial fibrillation, referred for arrhythmia management.  The patient was originally diagnosed with atrial fibrillation in 2017.  She had been doing well but was incidentally found to be in atrial fibrillation during an dentist visit in April 2023.  She was discharged in rate controlled atrial fibrillation but subsequently admitted with heart failure and atrial fibrillation.  She has since undergone DC cardioversions and April and, after additional recurrences, in November and December 2023.    She sensed that her AF recurred a few days after her most recent cardioversion. She has palpitations, fatigue, shortness of breath but no chest pain, syncope, pre-syncope.  Past Medical History:  Diagnosis Date   A-fib ([place])    Anemia    during pneumonia   Arthritis    Asthma    Hypertension     Past Surgical History:  Procedure Laterality Date   c section     1973 and 1983   CARDIOVERSION N/A 03/15/2022   Procedure: CARDIOVERSION;  Surgeon: Elouise Munroe, MD;  Location: [facility];  Service: Cardiovascular;  Laterality: N/A;  1340 shocked @120j , NSR   CARDIOVERSION N/A 11/22/2022   Procedure: CARDIOVERSION;  Surgeon: Pixie Casino, MD;  Location: [facility];  Service: Cardiovascular;  Laterality: N/A;   COLONOSCOPY     ORIF ANKLE FRACTURE Left 08/16/2016   ORIF ANKLE FRACTURE Left 08/16/2016   Procedure: OPEN REDUCTION INTERNAL FIXATION (ORIF) LEFT BIMALLEOLAR ANKLE FRACTURE;  Surgeon: Mcarthur Rossetti, MD;  Location: [facility];  Service: Orthopedics;  Laterality: Left;    TUBAL LIGATION      Current Medications: Current Meds  Medication Sig   acetaminophen (TYLENOL) 500 MG tablet Take 1,000 mg by mouth every 6 (six) hours as needed for mild pain or headache.   apixaban (ELIQUIS) 5 MG TABS tablet Take 1 tablet (5 mg total) by mouth 2 (two) times daily.   APPLE CIDER VINEGAR PO Take 1 tablet by mouth every morning.   busPIRone (BUSPAR) 5 MG tablet Take 5 mg by mouth 2 (two) times daily.    cholecalciferol (VITAMIN D) 1000 units tablet Take 2,000 Units by mouth daily.   diltiazem (CARDIZEM CD) 180 MG 24 hr capsule Take 1 capsule (180 mg total) by mouth daily.   flecainide (TAMBOCOR) 50 MG tablet Take 1 tablet (50 mg total) by mouth 2 (two) times daily.   furosemide (LASIX) 20 MG tablet Take 1 tablet (20 mg total) by mouth daily as needed.   Magnesium 400 MG CAPS Take 400 mg by mouth every morning.   metoprolol tartrate (LOPRESSOR) 25 MG tablet Take 1.5 tablets (37.5 mg total) by mouth 2 (two) times daily.   Multiple Vitamin (MULTIVITAMIN WITH MINERALS) TABS tablet Take 1 tablet by mouth daily.     Allergies:   Patient has no known allergies.   Social History   Socioeconomic History   Marital status: Single    Spouse name: Not on file   Number of children: Not on file   Years of education: Not on file   Highest  education level: Not on file  Occupational History   Not on file  Tobacco Use   Smoking status: Never   Smokeless tobacco: Never   Tobacco comments:    Never smoke 11/07/22  Vaping Use   Vaping Use: Never used  Substance and Sexual Activity   Alcohol use: No   Drug use: No   Sexual activity: Not Currently  Other Topics Concern   Not on file  Social History Narrative   Not on file   Social Determinants of Health   Financial Resource Strain: Not on file  Food Insecurity: Not on file  Transportation Needs: Not on file  Physical Activity: Not on file  Stress: Not on file  Social Connections: Not on file     Family History: The  patient's family history includes Asthma in her father; Atrial fibrillation in her mother; Heart attack in her father; Hypertension in her mother.  ROS:   Please see the history of present illness.    All other systems reviewed and are negative.  EKGs/Labs/Other Studies Reviewed Today:     TTE: 03/16/2022 EF 60-65%. Moderately dilated LA  EKG:  Last EKG results: today - Atrial fibrillation   Recent Labs: 03/11/2022: B Natriuretic Peptide 297.3; TSH 3.685 07/19/2022: ALT 14 11/05/2022: Magnesium 1.8 11/12/2022: BUN 11; Creatinine, Ser 0.98; Hemoglobin 14.5; Platelets 202; Potassium 4.0; Sodium 137     Physical Exam:    VS:  BP 122/72   Pulse 81   Ht 5\' 3"  (1.6 m)   Wt 218 lb (98.9 kg)   SpO2 98%   BMI 38.62 kg/m     Wt Readings from Last 3 Encounters:  12/10/22 218 lb (98.9 kg)  11/22/22 218 lb (98.9 kg)  11/07/22 216 lb 12.8 oz (98.3 kg)     GEN: Well nourished, well developed in no acute distress CARDIAC: iRRR, no murmurs, rubs, gallops RESPIRATORY:  Normal work of breathing MUSCULOSKELETAL: no edema    ASSESSMENT & PLAN:    Atrial fibrillation, persistent: symptomatic, failed flecainide. I recommended ablation. We discussed the indication, rationale, logistics, anticipated benefits, and potential risks of the ablation procedure including but not limited to -- bleed at the groin access site, chest pain, damage to nearby organs such as the diaphragm, lungs, or esophagus, need for a drainage tube, or prolonged hospitalization. I explained that the risk for stroke, heart attack, need for open chest surgery, or even death is very low but not zero. she  expressed understanding and wishes to proceed.  Secondary hyercoagulable state: continue eliquis Obesity: I informed her that her risks of maintaining sinus rhythm will be significantly higher if she loses weight. I encouraged her to monitor her caloric intake and eat a higher proportion for vegetables and fruits and avoid fats  and refined carbohydrates. Suspected sleep apnea: will order home sleep test.       Medication Adjustments/Labs and Tests Ordered: Current medicines are reviewed at length with the patient today.  Concerns regarding medicines are outlined above.  Orders Placed This Encounter  Procedures   EKG 12-Lead   No orders of the defined types were placed in this encounter.    Signed, Melida Quitter, MD  12/10/2022 1:03 PM    [place]

## 2022-12-10 NOTE — Addendum Note (Signed)
Addended by: Molli Barrows on: 12/10/2022 01:26 PM   Modules accepted: Orders

## 2022-12-10 NOTE — Patient Instructions (Addendum)
Medication Instructions:  Your physician recommends that you continue on your current medications as directed. Please refer to the Current Medication list given to you today.   *If you need a refill on your cardiac medications before your next appointment, please call your pharmacy*   Lab Work: On January 14, 2023: CBC, BMET (you do not need to be fasting) You may come to the lab any time between 7:30am and 4:30pm. If you have labs (blood work) drawn today and your tests are completely normal, you will receive your results only by: [place] (if you have MyChart) OR A paper copy in the mail If you have any lab test that is abnormal or we need to change your treatment, we will call you to review the results.   Testing/Procedures: Your physician has recommended that you have a sleep study. This test records several body functions during sleep, including: brain activity, eye movement, oxygen and carbon dioxide blood levels, heart rate and rhythm, breathing rate and rhythm, the flow of air through your mouth and nose, snoring, body muscle movements, and chest and belly movement.   Your physician has requested that you have cardiac CT. Cardiac computed tomography (CT) is a painless test that uses an x-ray machine to take clear, detailed pictures of your heart. For further information please visit [email or web address]. Please follow instruction sheet as given.   Your physician has recommended that you have an ablation. Catheter ablation is a medical procedure used to treat some cardiac arrhythmias (irregular heartbeats). During catheter ablation, a long, thin, flexible tube is put into a blood vessel in your groin (upper thigh), or neck. This tube is called an ablation catheter. It is then guided to your heart through the blood vessel. Radio frequency waves destroy small areas of heart tissue where abnormal heartbeats may cause an arrhythmia to start.    Your ablation procedure is scheduled for  Tuesday January 29, 2023 with Dr. Doralee Albino at 01:30 pm. Dennis Bast will arrive at [facility]. [facility] (main entrance A) at 11:30 am.     Follow-Up: At [facility], you and your health needs are our priority.  As part of our continuing mission to provide you with exceptional heart care, we have created designated Provider Care Teams.  These Care Teams include your primary Cardiologist (physician) and Advanced Practice Providers (APPs -  Physician Assistants and Nurse Practitioners) who all work together to provide you with the care you need, when you need it.   Your next appointment:   4 week(s) after your ablation   Provider:   You will follow up in the [facility] located at [facility]. Your provider will be: Roderic Palau, NP or Clint R. Fenton, PA-C

## 2022-12-11 ENCOUNTER — Telehealth: Payer: Self-pay | Admitting: *Deleted

## 2022-12-11 NOTE — Telephone Encounter (Signed)
[place] ordered by Dr. Myles Gip.  Patient was given all instructions and watched educational video.  She knows she will receive a call from our office once approved.

## 2022-12-14 ENCOUNTER — Encounter: Payer: Self-pay | Admitting: Cardiovascular Disease

## 2022-12-21 ENCOUNTER — Ambulatory Visit: Payer: No Typology Code available for payment source | Admitting: Internal Medicine

## 2023-01-03 NOTE — Progress Notes (Deleted)
Cardiology Office Note:    Date:  01/03/2023   ID:  Sabel, Braund 11/18/1951, MRN [id number]  PCP:  Iona Beard, [place] Providers Cardiologist:  Pixie Casino, MD Electrophysiologist:  Melida Quitter, MD { Click to update primary MD,subspecialty MD or APP then REFRESH:1}    Referring MD: Iona Beard, MD   No chief complaint on file. ***  History of Present Illness:    [name] [name] is a 72 y.o. female with a hx of ***  Past Medical History:  Diagnosis Date   A-fib ([place])    Anemia    during pneumonia   Arthritis    Asthma    Hypertension     Past Surgical History:  Procedure Laterality Date   c section     1973 and 1983   CARDIOVERSION N/A 03/15/2022   Procedure: CARDIOVERSION;  Surgeon: Elouise Munroe, MD;  Location: [facility];  Service: Cardiovascular;  Laterality: N/A;  1340 shocked @120j$ , NSR   CARDIOVERSION N/A 11/22/2022   Procedure: CARDIOVERSION;  Surgeon: Pixie Casino, MD;  Location: [facility] ENDOSCOPY;  Service: Cardiovascular;  Laterality: N/A;   COLONOSCOPY     ORIF ANKLE FRACTURE Left 08/16/2016   ORIF ANKLE FRACTURE Left 08/16/2016   Procedure: OPEN REDUCTION INTERNAL FIXATION (ORIF) LEFT BIMALLEOLAR ANKLE FRACTURE;  Surgeon: Mcarthur Rossetti, MD;  Location: [facility];  Service: Orthopedics;  Laterality: Left;   TUBAL LIGATION      Current Medications: No outpatient medications have been marked as taking for the 01/08/23 encounter (Appointment) with Ledora Bottcher, [place].     Allergies:   Patient has no known allergies.   Social History   Socioeconomic History   Marital status: Single    Spouse name: Not on file   Number of children: Not on file   Years of education: Not on file   Highest education level: Not on file  Occupational History   Not on file  Tobacco Use   Smoking status: Never   Smokeless tobacco: Never   Tobacco comments:    Never smoke 11/07/22  Vaping Use   Vaping Use: Never used   Substance and Sexual Activity   Alcohol use: No   Drug use: No   Sexual activity: Not Currently  Other Topics Concern   Not on file  Social History Narrative   Not on file   Social Determinants of Health   Financial Resource Strain: Not on file  Food Insecurity: Not on file  Transportation Needs: Not on file  Physical Activity: Not on file  Stress: Not on file  Social Connections: Not on file     Family History: The patient's ***family history includes Asthma in her father; Atrial fibrillation in her mother; Heart attack in her father; Hypertension in her mother.  ROS:   Please see the history of present illness.    *** All other systems reviewed and are negative.  EKGs/Labs/Other Studies Reviewed:    The following studies were reviewed today: ***  EKG:  EKG is *** ordered today.  The ekg ordered today demonstrates ***  Recent Labs: 03/11/2022: B Natriuretic Peptide 297.3; TSH 3.685 07/19/2022: ALT 14 11/05/2022: Magnesium 1.8 11/12/2022: BUN 11; Creatinine, Ser 0.98; Hemoglobin 14.5; Platelets 202; Potassium 4.0; Sodium 137  Recent Lipid Panel No results found for: "CHOL", "TRIG", "HDL", "CHOLHDL", "VLDL", "LDLCALC", "LDLDIRECT"   Risk Assessment/Calculations:   {Does this patient have ATRIAL FIBRILLATION?[phone]}  No BP recorded.  {Refresh Note  OR Click here to enter BP  :1}***    STOP-Bang Score:  6  { Consider Dx Sleep Disordered Breathing or Sleep Apnea  ICD G47.33          :1}     Physical Exam:    VS:  There were no vitals taken for this visit.    Wt Readings from Last 3 Encounters:  12/10/22 218 lb (98.9 kg)  11/22/22 218 lb (98.9 kg)  11/07/22 216 lb 12.8 oz (98.3 kg)     GEN: *** Well nourished, well developed in no acute distress HEENT: Normal NECK: No JVD; No carotid bruits LYMPHATICS: No lymphadenopathy CARDIAC: ***RRR, no murmurs, rubs, gallops RESPIRATORY:  Clear to auscultation without rales, wheezing or rhonchi  ABDOMEN: Soft,  non-tender, non-distended MUSCULOSKELETAL:  No edema; No deformity  SKIN: Warm and dry NEUROLOGIC:  Alert and oriented x 3 PSYCHIATRIC:  Normal affect   ASSESSMENT:    No diagnosis found. PLAN:    In order of problems listed above:  ***      {Are you ordering a CV Procedure (e.g. stress test, cath, DCCV, TEE, etc)?   Press F2        :[id number]    Medication Adjustments/Labs and Tests Ordered: Current medicines are reviewed at length with the patient today.  Concerns regarding medicines are outlined above.  No orders of the defined types were placed in this encounter.  No orders of the defined types were placed in this encounter.   There are no Patient Instructions on file for this visit.   Signed, Ledora Bottcher, [place]  01/03/2023 5:12 PM    [place]

## 2023-01-03 NOTE — Telephone Encounter (Signed)
I s/w the pt and she has been given the PIN# [id number] and ok to proceed. Pt said she will do her sleep study between tonight and this weekend.Hulen Skains and made the patient aware that she may proceed with the [facility] Sleep Study. PIN # provided to the patient. Patient made aware that she will be contacted after the test has been read with the results and any recommendations. Patient verbalized understanding and thanked me for the call.

## 2023-01-03 NOTE — Telephone Encounter (Signed)
Prior Authorization for [facility] sent to [employer] via web portal. Tracking Number .  READY- Procedure code [id number] does not require an authorization.

## 2023-01-05 ENCOUNTER — Encounter (INDEPENDENT_AMBULATORY_CARE_PROVIDER_SITE_OTHER): Payer: No Typology Code available for payment source | Admitting: Cardiology

## 2023-01-05 DIAGNOSIS — G4733 Obstructive sleep apnea (adult) (pediatric): Secondary | ICD-10-CM

## 2023-01-06 NOTE — Progress Notes (Unsigned)
Cardiology Clinic Note   Patient Name: [name] [name] Date of Encounter: 01/08/2023  Primary Care Provider:  Iona Beard, MD Primary Cardiologist:  [name] Casino, MD  Patient Profile    [name] [name] is a 72 y.o. female with a past medical history of PAF s/p cardioversion x 3 2023 on anticoagulation, chronic diastolic heart failure, hypertension who presents to the clinic today for 64-monthfollow-up of chronic cardiac conditions.  Past Medical History    Past Medical History:  Diagnosis Date   A-fib ([facility]    Anemia    during pneumonia   Arthritis    Asthma    Hypertension    Past Surgical History:  Procedure Laterality Date   c section     1973 and 1983   CARDIOVERSION N/A 03/15/2022   Procedure: CARDIOVERSION;  Surgeon: [name] Munroe MD;  [facility]  Service: Cardiovascular;  Laterality: N/A;  1340 shocked @120j$ , NSR   CARDIOVERSION N/A 11/22/2022   Procedure: CARDIOVERSION;  Surgeon: HPixie Casino MD;  [facility]  Service: Cardiovascular;  Laterality: N/A;   COLONOSCOPY     ORIF ANKLE FRACTURE Left 08/16/2016   ORIF ANKLE FRACTURE Left 08/16/2016   Procedure: OPEN REDUCTION INTERNAL FIXATION (ORIF) LEFT BIMALLEOLAR ANKLE FRACTURE;  Surgeon: [name] Rossetti MD;  [facility]  Service: Orthopedics;  Laterality: Left;   TUBAL LIGATION      Allergies  No Known Allergies  History of Present Illness    [name] YZAGUIRREhas a past medical history of: PAF. Onset March 2017. S/p cardioversion x 3 2023 (April, November, December). Chronic diastolic heart failure. Echo 03/01/2022: EF 60 to 65%.  Mild LVH.  Mildly elevated PA systolic pressure.  Moderate LAE.  Mild MR.  Trivial AR.  Aortic valve sclerosis without stenosis.  RA pressure 8 mmHg. Hypertension.  [name] [name] first evaluated by [name] [name] 02/11/2016 during ER visit for palpitations.  She converted to normal sinus with IV Cardizem with a short recurrence while still in  the ER.  She was started on oral diltiazem and Eliquis.  She had a normal, low risk stress test in March 2017.  Throughout the years patient reported possible brief episodes of A-fib.  In April 2023 she was found to have elevated heart rate at the dentist and presented to the ER the following day where she was found to be in A-fib with RVR.  Cardizem was increased and she was discharged for outpatient follow-up.  Patient return to the ER approximately a week later with increased shortness of breath and found to be in A-fib with RVR with evidence of fluid overload.  She was diuresed with IV Lasix, given IV Lopressor for heart rate control, and IV Cardizem.  She was admitted to the hospital and underwent successful cardioversion on 03/15/2022. She had an echo with preserved EF (as outlined above).  Patient return to ER November 2023 for elevated heart rate with no response to p.o. meds at home.  She was cardioverted to normal sinus rhythm in the ER.  She was evaluated by [name] So PA-C with the A-fib clinic in December 2023 and was scheduled for outpatient DCCV which she underwent on 11/22/2022.  She was last seen in the office by [name] [name] 12/10/2022.  She remained in A-fib at that time.  She was scheduled for an at home sleep study and plan for ablation March 2024.  Today, patient is doing well. Her biggest complaint  is that she is not sleeping well. She is unsure if this is secondary to feeling anxious about her upcoming ablation. She was able to complete her home sleep study on Saturday. She does report feeling palpitations occasionally, particularly if she is doing heavy exertion. She does not normally have shortness of breath but will feel "winded" with heavy exertion. She has been able to complete all household activities without difficulty. She has not been walking outside much secondary to the cold weather but will take extra laps around the store when she is at [facility] She weighs daily and her  weight has been stable overall. She did note a 2 lb weight gain this morning [name] she took her prn lasix. No chest pain, pressure, or tightness. Denies orthopnea or PND. She reports occasional lower extremity edema (L>R) but none today.     Home Medications    Current Meds  Medication Sig   acetaminophen (TYLENOL) 500 MG tablet Take 1,000 mg by mouth every 6 (six) hours as needed for mild pain or headache.   apixaban (ELIQUIS) 5 MG TABS tablet Take 1 tablet (5 mg total) by mouth 2 (two) times daily.   APPLE CIDER VINEGAR PO Take 1 tablet by mouth every morning.   busPIRone (BUSPAR) 5 MG tablet Take 5 mg by mouth 2 (two) times daily.    cholecalciferol (VITAMIN D) 1000 units tablet Take 2,000 Units by mouth daily.   diltiazem (CARDIZEM CD) 180 MG 24 hr capsule Take 1 capsule (180 mg total) by mouth daily.   flecainide (TAMBOCOR) 50 MG tablet Take 1 tablet (50 mg total) by mouth 2 (two) times daily.   furosemide (LASIX) 20 MG tablet Take 1 tablet (20 mg total) by mouth daily as needed.   Magnesium 400 MG CAPS Take 400 mg by mouth every morning.   metoprolol tartrate (LOPRESSOR) 25 MG tablet Take 1.5 tablets (37.5 mg total) by mouth 2 (two) times daily.   Multiple Vitamin (MULTIVITAMIN WITH MINERALS) TABS tablet Take 1 tablet by mouth daily.    Family History    Family History  Problem Relation Age of Onset   Atrial fibrillation Mother    Hypertension Mother    Asthma Father    Heart attack Father    She indicated that her mother is deceased. She indicated that her father is deceased.   Social History    Social History   Socioeconomic History   Marital status: Single    Spouse name: Not on file   Number of children: Not on file   Years of education: Not on file   Highest education level: Not on file  Occupational History   Not on file  Tobacco Use   Smoking status: Never   Smokeless tobacco: Never   Tobacco comments:    Never smoke 11/07/22  Vaping Use   Vaping Use: Never  used  Substance and Sexual Activity   Alcohol use: No   Drug use: No   Sexual activity: Not Currently  Other Topics Concern   Not on file  Social History Narrative   Not on file   Social Determinants of Health   Financial Resource Strain: Not on file  Food Insecurity: Not on file  Transportation Needs: Not on file  Physical Activity: Not on file  Stress: Not on file  Social Connections: Not on file  Intimate Partner Violence: Not on file     Review of Systems    General:  No chills, fever, night sweats or  weight changes.  Cardiovascular:  No chest pain, dyspnea on exertion, edema, orthopnea, palpitations, paroxysmal nocturnal dyspnea. Dermatological: No rash, lesions/masses Respiratory: No cough, dyspnea Urologic: No hematuria, dysuria Abdominal:   No nausea, vomiting, diarrhea, bright red blood per rectum, melena, or hematemesis Neurologic:  No visual changes, weakness, changes in mental status. All other systems reviewed and are otherwise negative except as noted above.  Physical Exam    VS:  BP 130/86   Pulse 92   Ht 5' 3"$  (1.6 m)   Wt 215 lb (97.5 kg)   SpO2 94%   BMI 38.09 kg/m  , BMI Body mass index is 38.09 kg/m. GEN:  Well nourished, well developed, in no acute distress. HEENT: Normal. Neck: Supple, no JVD, carotid bruits, or masses. Cardiac: Irregular rhythm, no murmurs, rubs, or gallops. No clubbing, cyanosis, edema.  Radials/DP/PT 2+ and equal bilaterally.  Respiratory:  Respirations regular and unlabored, clear to auscultation bilaterally. GI: Soft, nontender, nondistended. MS: No deformity or atrophy. Skin: Warm and dry, no rash. Neuro: Strength and sensation are intact. Psych: Normal affect.  Accessory Clinical Findings    Recent Labs: 03/11/2022: B Natriuretic Peptide 297.3; TSH 3.685 07/19/2022: ALT 14 11/05/2022: Magnesium 1.8 11/12/2022: BUN 11; Creatinine, Ser 0.98; Hemoglobin 14.5; Platelets 202; Potassium 4.0; Sodium 137   Recent Lipid  Panel No results found for: "CHOL", "TRIG", "HDL", "CHOLHDL", "VLDL", "LDLCALC", "LDLDIRECT"  ECG personally reviewed by me today: Atrial fibrillation, rate 92.  No significant changes from 12/10/2022.   CHA2DS2-VASc Score = 4   This indicates a 4.8% annual risk of stroke. The patient's score is based upon: CHF History: 1 HTN History: 1 Diabetes History: 0 Stroke History: 0 Vascular Disease History: 0 Age Score: 1 Gender Score: 1      Assessment & Plan   PAF.  S/p cardioversion x 3 in 2023 (April, November, December).  Patient is scheduled for A-fib ablation 01/29/2023. EKG shows afib, rate 92. She reports occasional palpitations when she does heavy exertion along with feeling slightly winded. This is brief in nature and resolves when she slows down. Continue Eliquis, diltiazem, flecainide, metoprolol.  Chronic diastolic heart failure.  Echo April 2023 showed EF 60 to 65%, mildly elevated PA pressure, moderate LAE, mild MR.  Patient reports occasional lower extremity edema. She weighs daily and her weight has overall been stable. Her weight was up 2 lb [name] she took prn lasix. Euvolemic and well compensated on exam with clear breath sounds and no lower extremity edema. Continue metoprolol and as needed furosemide. Hypertension.  BP today 130/86.  Patient denies headaches or dizziness.  Continue diltiazem and metoprolol.  Disposition: Return in 6 months or sooner as needed.    Justice Britain. Ephrem Carrick, DNP, NP-C     01/08/2023, 10:51 AM [address] Office [phone] Fax [phone]

## 2023-01-07 ENCOUNTER — Ambulatory Visit: Payer: No Typology Code available for payment source | Attending: Cardiovascular Disease

## 2023-01-07 DIAGNOSIS — I4819 Other persistent atrial fibrillation: Secondary | ICD-10-CM

## 2023-01-07 NOTE — Procedures (Signed)
SLEEP STUDY REPORT Patient Information Study Date: 01/05/2023 Patient Name: [name] [name] Patient ID: [id number] Birth Date: 04-May-1951 Age: 72 Gender: Female BMI: 38.7 (W=218 lb, H=5' 3'') Stopbang: 6 Referring Physician: Doralee Albino, MD  TEST DESCRIPTION:  Home sleep apnea testing was completed using the WatchPat, a Type 1 device, utilizing peripheral arterial tonometry (PAT), chest movement, actigraphy, pulse oximetry, pulse rate, body position and snore.  AHI was calculated with apnea and hypopnea using valid sleep time as the denominator. RDI includes apneas, hypopneas, and RERAs.  The data acquired and the scoring of sleep and all associated events were performed in accordance with the recommended standards and specifications as outlined in the AASM Manual for the Scoring of Sleep and Associated Events 2.2.0 (2015).  FINDINGS:  1.  Moderate Obstructive Sleep Apnea with AHI 16.4/hr.   2.  No Central Sleep Apnea with pAHIc 0.4/hr.  3.  Oxygen desaturations as low as 78%.  4.  Severe snoring was present. O2 sats were < 88% for 15 min.  5.  Total sleep time was 8 hrs and 20 min.  6.  15.9% of total sleep time was spent in REM sleep.   7.  Shortened sleep onset latency at 5 min  8.  Normal REM sleep onset latency at 88 min.   9.  Total awakenings were 9.  10. Arrhythmia detection:  Suggestive of possible brief atrial fibrillation lasting 3 hr 56 min and 18 seconds.  This is not diagnostic and further testing with outpatient telemetry monitoring is recommended.  DIAGNOSIS:   Moderate Obstructive Sleep Apnea (G47.33) Possible Atrial Fibrillation Nocturnal Hypoxemia  RECOMMENDATIONS:   1.  Clinical correlation of these findings is necessary.  The decision to treat obstructive sleep apnea (OSA) is usually based on the presence of apnea symptoms or the presence of associated medical conditions such as Hypertension, Congestive Heart Failure, Atrial Fibrillation or Obesity.  The most  common symptoms of OSA are snoring, gasping for breath while sleeping, daytime sleepiness and fatigue.   2.  Initiating apnea therapy is recommended given the presence of symptoms and/or associated conditions. Recommend proceeding with one of the following:     a.  Auto-CPAP therapy with a pressure range of 5-20cm H2O.     b.  An oral appliance (OA) that can be obtained from certain dentists with expertise in sleep medicine.  These are primarily of use in non-obese patients with mild and moderate disease.     c.  An ENT consultation which may be useful to look for specific causes of obstruction and possible treatment options.     d.  If patient is intolerant to PAP therapy, consider referral to ENT for evaluation for hypoglossal nerve stimulator.   3.  Close follow-up is necessary to ensure success with CPAP or oral appliance therapy for maximum benefit.  4.  A follow-up oximetry study on CPAP is recommended to assess the adequacy of therapy and determine the need for supplemental oxygen or the potential need for Bi-level therapy.  An arterial blood gas to determine the adequacy of baseline ventilation and oxygenation should also be considered.  5.  Healthy sleep recommendations include:  adequate nightly sleep (normal 7-9 hrs/night), avoidance of caffeine after noon and alcohol near bedtime, and maintaining a sleep environment that is cool, dark and quiet.  6.  Weight loss for overweight patients is recommended.  Even modest amounts of weight loss can significantly improve the severity of sleep apnea.  7.  Snoring recommendations  include:  weight loss where appropriate, side sleeping, and avoidance of alcohol before bed.  8.  Operation of motor vehicle should not be performed when sleepy.  9.  Consider outpatient heart monitor to assess for atrial fibrillation if clinically indicated.   Signature: Fransico Him, MD; [facility]; [place], [place] Board of Sleep Medicine Electronically Signed:  01/07/2023

## 2023-01-08 ENCOUNTER — Encounter: Payer: Self-pay | Admitting: Physician Assistant

## 2023-01-08 ENCOUNTER — Ambulatory Visit: Payer: No Typology Code available for payment source | Attending: Internal Medicine | Admitting: Student

## 2023-01-08 VITALS — BP 130/86 | HR 92 | Ht 63.0 in | Wt 215.0 lb

## 2023-01-08 DIAGNOSIS — I10 Essential (primary) hypertension: Secondary | ICD-10-CM | POA: Diagnosis not present

## 2023-01-08 DIAGNOSIS — I5032 Chronic diastolic (congestive) heart failure: Secondary | ICD-10-CM | POA: Diagnosis not present

## 2023-01-08 DIAGNOSIS — I480 Paroxysmal atrial fibrillation: Secondary | ICD-10-CM

## 2023-01-08 MED ORDER — APIXABAN 5 MG PO TABS: 5.0000 mg | ORAL_TABLET | Freq: Two times a day (BID) | ORAL | 3 refills | Status: DC

## 2023-01-08 MED ORDER — FUROSEMIDE 20 MG PO TABS: 20.0000 mg | ORAL_TABLET | Freq: Every day | ORAL | 6 refills | Status: DC | PRN

## 2023-01-08 NOTE — Patient Instructions (Signed)
Medication Instructions:  No Changes *If you need a refill on your cardiac medications before your next appointment, please call your pharmacy*   Lab Work: No Labs If you have labs (blood work) drawn today and your tests are completely normal, you will receive your results only by: [place] (if you have MyChart) OR A paper copy in the mail If you have any lab test that is abnormal or we need to change your treatment, we will call you to review the results.   Testing/Procedures: No Testing   Follow-Up: At [facility], you and your health needs are our priority.  As part of our continuing mission to provide you with exceptional heart care, we have created designated Provider Care Teams.  These Care Teams include your primary Cardiologist (physician) and Advanced Practice Providers (APPs -  Physician Assistants and Nurse Practitioners) who all work together to provide you with the care you need, when you need it.  We recommend signing up for the patient portal called "MyChart".  Sign up information is provided on this After Visit Summary.  MyChart is used to connect with patients for Virtual Visits (Telemedicine).  Patients are able to view lab/test results, encounter notes, upcoming appointments, etc.  Non-urgent messages can be sent to your provider as well.   To learn more about what you can do with MyChart, go to [email or web address].    Your next appointment:   6 month(s)  Provider:   Pixie Casino, MD

## 2023-01-14 ENCOUNTER — Ambulatory Visit: Payer: No Typology Code available for payment source | Attending: Internal Medicine

## 2023-01-14 DIAGNOSIS — I4819 Other persistent atrial fibrillation: Secondary | ICD-10-CM | POA: Diagnosis not present

## 2023-01-14 DIAGNOSIS — I5032 Chronic diastolic (congestive) heart failure: Secondary | ICD-10-CM | POA: Diagnosis not present

## 2023-01-14 DIAGNOSIS — I10 Essential (primary) hypertension: Secondary | ICD-10-CM | POA: Diagnosis not present

## 2023-01-14 DIAGNOSIS — Z01812 Encounter for preprocedural laboratory examination: Secondary | ICD-10-CM | POA: Diagnosis not present

## 2023-01-15 LAB — BASIC METABOLIC PANEL
BUN/Creatinine Ratio: 16 (ref 12–28)
BUN: 13 mg/dL (ref 8–27)
CO2: 26 mmol/L (ref 20–29)
Calcium: 9.5 mg/dL (ref 8.7–10.3)
Chloride: 101 mmol/L (ref 96–106)
Creatinine, Ser: 0.83 mg/dL (ref 0.57–1.00)
Glucose: 110 mg/dL — ABNORMAL HIGH (ref 70–99)
Potassium: 4.4 mmol/L (ref 3.5–5.2)
Sodium: 141 mmol/L (ref 134–144)
eGFR: 76 mL/min/{1.73_m2} (ref >59)

## 2023-01-15 LAB — CBC
Hematocrit: 45.2 % (ref 34.0–46.6)
Hemoglobin: 14.9 g/dL (ref 11.1–15.9)
MCH: 32 pg (ref 26.6–33.0)
MCHC: 33 g/dL (ref 31.5–35.7)
MCV: 97 fL (ref 79–97)
Platelets: 194 10*3/uL (ref 150–450)
RBC: 4.66 x10E6/uL (ref 3.77–5.28)
RDW: 12.6 % (ref 11.7–15.4)
WBC: 5.7 10*3/uL (ref 3.4–10.8)

## 2023-01-22 ENCOUNTER — Telehealth (HOSPITAL_COMMUNITY): Payer: Self-pay | Admitting: Emergency Medicine

## 2023-01-22 NOTE — Telephone Encounter (Signed)
Reaching out to patient to offer assistance regarding upcoming cardiac imaging study; pt verbalizes understanding of appt date/time, parking situation and where to check in, pre-test NPO status and medications ordered, and verified current allergies; name and call back number provided for further questions should they arise Marchia Bond RN Navigator Cardiac Imaging [facility] [phone] office [phone] cell  Arrival 915 [address]  37.'5mg'$  metoprolol , holding lasix Denies iv issues Aware contrast

## 2023-01-23 ENCOUNTER — Ambulatory Visit (HOSPITAL_BASED_OUTPATIENT_CLINIC_OR_DEPARTMENT_OTHER)
Admission: RE | Admit: 2023-01-23 | Discharge: 2023-01-23 | Disposition: A | Discharge status: Home / Self Care | Payer: No Typology Code available for payment source | Source: Ambulatory Visit | Attending: Cardiovascular Disease | Admitting: Cardiovascular Disease

## 2023-01-23 ENCOUNTER — Telehealth: Payer: Self-pay | Admitting: Cardiovascular Disease

## 2023-01-23 ENCOUNTER — Encounter (HOSPITAL_BASED_OUTPATIENT_CLINIC_OR_DEPARTMENT_OTHER): Payer: Self-pay

## 2023-01-23 DIAGNOSIS — I2699 Other pulmonary embolism without acute cor pulmonale: Secondary | ICD-10-CM

## 2023-01-23 DIAGNOSIS — I4819 Other persistent atrial fibrillation: Secondary | ICD-10-CM | POA: Diagnosis not present

## 2023-01-23 MED ORDER — IOHEXOL 350 MG/ML SOLN
100.0000 mL | Freq: Once | INTRAVENOUS | Status: AC | PRN
  Administered 2023-01-23: 80 mL via INTRAVENOUS

## 2023-01-23 NOTE — Telephone Encounter (Signed)
Left voicemail for patient to return office.

## 2023-01-23 NOTE — Telephone Encounter (Addendum)
Patient is aware of CT results and she will continue on eliquis '5mg'$  po bid per provider recommendations. She verbalized understanding. Office will call to schedule bilateral DVT.

## 2023-01-23 NOTE — Telephone Encounter (Signed)
Patient returned call

## 2023-01-23 NOTE — Telephone Encounter (Signed)
Diane from [facility] calling to report critical CT results

## 2023-01-23 NOTE — Telephone Encounter (Signed)
[name] [name] from [facility] radiology calling to give CT results  IMPRESSION: 1. Small filling defect in a right lower lobe pulmonary artery branch consistent with a small isolated pulmonary embolism. 2. Enlarged pulmonary arteries may suggest pulmonary hypertension. 3. Bibasilar atelectasis and/or scarring changes. 4. No mediastinal or hilar mass or adenopathy.

## 2023-01-23 NOTE — Addendum Note (Signed)
Addended by: Moishe Spice on: 01/23/2023 03:55 PM   Modules accepted: Orders

## 2023-01-23 NOTE — Telephone Encounter (Signed)
Small PE noted on outpatient CT.  Patient already on ELiquis.  COntinue anticoauglation.  No other management at this time unless she has worsening symptoms of shortness of breath.

## 2023-01-24 ENCOUNTER — Telehealth: Payer: Self-pay | Admitting: Cardiovascular Disease

## 2023-01-24 ENCOUNTER — Ambulatory Visit (HOSPITAL_COMMUNITY)
Admission: RE | Admit: 2023-01-24 | Discharge: 2023-01-24 | Disposition: A | Discharge status: Home / Self Care | Payer: No Typology Code available for payment source | Source: Ambulatory Visit | Attending: Cardiovascular Disease | Admitting: Cardiovascular Disease

## 2023-01-24 DIAGNOSIS — I2699 Other pulmonary embolism without acute cor pulmonale: Secondary | ICD-10-CM | POA: Diagnosis not present

## 2023-01-24 NOTE — Telephone Encounter (Signed)
Caller is reporting preliminary results.

## 2023-01-24 NOTE — Telephone Encounter (Signed)
[name] [name] with [facility] called to report to Dr. Myles Gip that the pt was negative for DVT on her LE Venous doppler.   [name] [name] is aware that I will pass the good report onto Dr. Myles Gip and his RN for further review.  [name] [name] verbalized understanding and agrees with this plan.

## 2023-01-28 NOTE — Pre-Procedure Instructions (Signed)
Instructed patient on the following items: Arrival time 1130 Nothing to eat or drink after midnight No meds AM of procedure Responsible person to drive you home and stay with you for 24 hrs  Have you missed any doses of anti-coagulant Eliquis- hasn't missed any doses   

## 2023-01-29 ENCOUNTER — Other Ambulatory Visit: Payer: Self-pay

## 2023-01-29 ENCOUNTER — Encounter (HOSPITAL_COMMUNITY): Payer: Self-pay | Admitting: Cardiovascular Disease

## 2023-01-29 ENCOUNTER — Ambulatory Visit (HOSPITAL_COMMUNITY)
Admission: RE | Admit: 2023-01-29 | Discharge: 2023-01-29 | Disposition: A | Discharge status: Home / Self Care | Payer: No Typology Code available for payment source | Source: Ambulatory Visit | Attending: Cardiovascular Disease | Admitting: Cardiovascular Disease

## 2023-01-29 ENCOUNTER — Other Ambulatory Visit (HOSPITAL_COMMUNITY): Payer: Self-pay

## 2023-01-29 ENCOUNTER — Ambulatory Visit (HOSPITAL_BASED_OUTPATIENT_CLINIC_OR_DEPARTMENT_OTHER): Payer: No Typology Code available for payment source | Admitting: Anesthesiology

## 2023-01-29 ENCOUNTER — Encounter (HOSPITAL_COMMUNITY)
Admission: RE | Disposition: A | Discharge status: Home / Self Care | Payer: No Typology Code available for payment source | Source: Ambulatory Visit | Attending: Cardiovascular Disease

## 2023-01-29 ENCOUNTER — Ambulatory Visit (HOSPITAL_COMMUNITY): Payer: No Typology Code available for payment source | Admitting: Anesthesiology

## 2023-01-29 DIAGNOSIS — D6869 Other thrombophilia: Secondary | ICD-10-CM | POA: Insufficient documentation

## 2023-01-29 DIAGNOSIS — Z7901 Long term (current) use of anticoagulants: Secondary | ICD-10-CM | POA: Diagnosis not present

## 2023-01-29 DIAGNOSIS — I4891 Unspecified atrial fibrillation: Secondary | ICD-10-CM

## 2023-01-29 DIAGNOSIS — Z8249 Family history of ischemic heart disease and other diseases of the circulatory system: Secondary | ICD-10-CM | POA: Diagnosis not present

## 2023-01-29 DIAGNOSIS — I110 Hypertensive heart disease with heart failure: Secondary | ICD-10-CM | POA: Diagnosis not present

## 2023-01-29 DIAGNOSIS — G4730 Sleep apnea, unspecified: Secondary | ICD-10-CM | POA: Insufficient documentation

## 2023-01-29 DIAGNOSIS — Z6837 Body mass index (BMI) 37.0-37.9, adult: Secondary | ICD-10-CM | POA: Diagnosis not present

## 2023-01-29 DIAGNOSIS — I4819 Other persistent atrial fibrillation: Secondary | ICD-10-CM | POA: Insufficient documentation

## 2023-01-29 DIAGNOSIS — E669 Obesity, unspecified: Secondary | ICD-10-CM | POA: Insufficient documentation

## 2023-01-29 DIAGNOSIS — I509 Heart failure, unspecified: Secondary | ICD-10-CM | POA: Insufficient documentation

## 2023-01-29 HISTORY — PX: ATRIAL FIBRILLATION ABLATION: EP1191

## 2023-01-29 LAB — POCT ACTIVATED CLOTTING TIME
Activated Clotting Time: 325 seconds
Activated Clotting Time: 304 seconds

## 2023-01-29 SURGERY — ATRIAL FIBRILLATION ABLATION
Anesthesia: General

## 2023-01-29 MED ORDER — ROCURONIUM BROMIDE 10 MG/ML (PF) SYRINGE
PREFILLED_SYRINGE | INTRAVENOUS | Status: DC | PRN
  Administered 2023-01-29: 60 mg via INTRAVENOUS
  Administered 2023-01-29: 30 mg via INTRAVENOUS

## 2023-01-29 MED ORDER — SODIUM CHLORIDE 0.9% FLUSH: 3.0000 mL | Freq: Two times a day (BID) | INTRAVENOUS | Status: DC

## 2023-01-29 MED ORDER — PROPOFOL 10 MG/ML IV BOLUS
INTRAVENOUS | Status: DC | PRN
  Administered 2023-01-29: 160 mg via INTRAVENOUS
  Administered 2023-01-29: 40 mg via INTRAVENOUS

## 2023-01-29 MED ORDER — HEPARIN SODIUM (PORCINE) 1000 UNIT/ML IJ SOLN
INTRAMUSCULAR | Status: AC
  Filled 2023-01-29: qty 10

## 2023-01-29 MED ORDER — LIDOCAINE 2% (20 MG/ML) 5 ML SYRINGE
INTRAMUSCULAR | Status: DC | PRN
  Administered 2023-01-29: 60 mg via INTRAVENOUS

## 2023-01-29 MED ORDER — ACETAMINOPHEN 325 MG PO TABS
ORAL_TABLET | ORAL | Status: AC
  Filled 2023-01-29: qty 2

## 2023-01-29 MED ORDER — SODIUM CHLORIDE 0.9 % IV SOLN: 250.0000 mL | INTRAVENOUS | Status: DC | PRN

## 2023-01-29 MED ORDER — PHENYLEPHRINE HCL-NACL 20-0.9 MG/250ML-% IV SOLN
INTRAVENOUS | Status: DC | PRN
  Administered 2023-01-29: 35 ug/min via INTRAVENOUS

## 2023-01-29 MED ORDER — COLCHICINE 0.6 MG PO TABS
0.6000 mg | ORAL_TABLET | Freq: Two times a day (BID) | ORAL | 0 refills | Status: DC
  Filled 2023-01-29: qty 10, 5d supply, fill #0

## 2023-01-29 MED ORDER — HEPARIN (PORCINE) IN NACL 1000-0.9 UT/500ML-% IV SOLN
INTRAVENOUS | Status: DC | PRN
  Administered 2023-01-29 (×3): 500 mL

## 2023-01-29 MED ORDER — SUGAMMADEX SODIUM 200 MG/2ML IV SOLN
INTRAVENOUS | Status: DC | PRN
  Administered 2023-01-29: 200 mg via INTRAVENOUS

## 2023-01-29 MED ORDER — DOBUTAMINE INFUSION FOR EP/ECHO/NUC (1000 MCG/ML)
INTRAVENOUS | Status: DC | PRN
  Administered 2023-01-29: 20 ug/kg/min via INTRAVENOUS

## 2023-01-29 MED ORDER — PANTOPRAZOLE SODIUM 40 MG PO TBEC
40.0000 mg | DELAYED_RELEASE_TABLET | Freq: Every day | ORAL | 0 refills | Status: DC
  Filled 2023-01-29: qty 45, 45d supply, fill #0

## 2023-01-29 MED ORDER — ONDANSETRON HCL 4 MG/2ML IJ SOLN: 4.0000 mg | Freq: Four times a day (QID) | INTRAMUSCULAR | Status: DC | PRN

## 2023-01-29 MED ORDER — HEPARIN SODIUM (PORCINE) 1000 UNIT/ML IJ SOLN
INTRAMUSCULAR | Status: DC | PRN
  Administered 2023-01-29: 1000 [IU] via INTRAVENOUS

## 2023-01-29 MED ORDER — SODIUM CHLORIDE 0.9 % IV SOLN: INTRAVENOUS | Status: DC

## 2023-01-29 MED ORDER — PROPOFOL 500 MG/50ML IV EMUL
INTRAVENOUS | Status: DC | PRN
  Administered 2023-01-29: 125 ug/kg/min via INTRAVENOUS

## 2023-01-29 MED ORDER — ACETAMINOPHEN 325 MG PO TABS
650.0000 mg | ORAL_TABLET | ORAL | Status: DC | PRN
  Administered 2023-01-29: 650 mg via ORAL

## 2023-01-29 MED ORDER — DOBUTAMINE INFUSION FOR EP/ECHO/NUC (1000 MCG/ML)
INTRAVENOUS | Status: AC
  Filled 2023-01-29: qty 250

## 2023-01-29 MED ORDER — HEPARIN SODIUM (PORCINE) 1000 UNIT/ML IJ SOLN
INTRAMUSCULAR | Status: DC | PRN
  Administered 2023-01-29: 15000 [IU] via INTRAVENOUS

## 2023-01-29 MED ORDER — SODIUM CHLORIDE 0.9% FLUSH: 3.0000 mL | INTRAVENOUS | Status: DC | PRN

## 2023-01-29 MED ORDER — DEXAMETHASONE SODIUM PHOSPHATE 10 MG/ML IJ SOLN
INTRAMUSCULAR | Status: DC | PRN
  Administered 2023-01-29: 5 mg via INTRAVENOUS

## 2023-01-29 MED ORDER — PROTAMINE SULFATE 10 MG/ML IV SOLN
INTRAVENOUS | Status: DC | PRN
  Administered 2023-01-29: 50 mg via INTRAVENOUS

## 2023-01-29 MED ORDER — ONDANSETRON HCL 4 MG/2ML IJ SOLN
INTRAMUSCULAR | Status: DC | PRN
  Administered 2023-01-29: 4 mg via INTRAVENOUS

## 2023-01-29 SURGICAL SUPPLY — 18 items
CATH 8FR REPROCESSED SOUNDSTAR (CATHETERS) ×1 IMPLANT
CATH 8FR SOUNDSTAR REPROCESSED (CATHETERS) IMPLANT
CATH ABLAT QDOT MICRO BI TC FJ (CATHETERS) IMPLANT
CATH OCTARAY 2.0 F 3-3-3-3-3 (CATHETERS) IMPLANT
CATH PIGTAIL STEERABLE D1 8.7 (WIRE) IMPLANT
CATH S-M CIRCA TEMP PROBE (CATHETERS) IMPLANT
CATH WEBSTER BI DIR CS D-F CRV (CATHETERS) IMPLANT
CLOSURE PERCLOSE PROSTYLE (VASCULAR PRODUCTS) IMPLANT
COVER SWIFTLINK CONNECTOR (BAG) ×1 IMPLANT
DEVICE CLOSURE MYNXGRIP 6/7F (Vascular Products) IMPLANT
PACK EP LATEX FREE (CUSTOM PROCEDURE TRAY) ×1
PACK EP LF (CUSTOM PROCEDURE TRAY) ×1 IMPLANT
PAD DEFIB RADIO PHYSIO CONN (PAD) ×1 IMPLANT
PATCH CARTO3 (PAD) IMPLANT
SHEATH CARTO VIZIGO MED CURVE (SHEATH) IMPLANT
SHEATH PINNACLE 8F 10CM (SHEATH) IMPLANT
SHEATH PINNACLE 9F 10CM (SHEATH) IMPLANT
TUBING SMART ABLATE COOLFLOW (TUBING) IMPLANT

## 2023-01-29 NOTE — Anesthesia Preprocedure Evaluation (Signed)
Anesthesia Evaluation  Patient identified by MRN, date of birth, ID band Patient awake    Reviewed: Allergy & Precautions, NPO status , Patient's Chart, lab work & pertinent test results  History of Anesthesia Complications Negative for: history of anesthetic complications  Airway Mallampati: III  TM Distance: >3 FB Neck ROM: Full    Dental  (+) Teeth Intact, Dental Advisory Given   Pulmonary asthma    breath sounds clear to auscultation       Cardiovascular hypertension, Pt. on medications +CHF  + dysrhythmias Atrial Fibrillation  Rhythm:Regular   1. Left ventricular ejection fraction, by estimation, is 60 to 65%. Left  ventricular ejection fraction by 2D MOD biplane is 61.6 %. The left  ventricle has normal function. The left ventricle has no regional wall  motion abnormalities. There is mild left  ventricular hypertrophy. Left ventricular diastolic function could not be  evaluated.   2. Right ventricular systolic function is normal. The right ventricular  size is normal. There is mildly elevated pulmonary artery systolic  pressure. The estimated right ventricular systolic pressure is [id number] mmHg.   3. Left atrial size was moderately dilated.   4. The mitral valve is grossly normal. Mild mitral valve regurgitation.   5. The aortic valve is tricuspid. Aortic valve regurgitation is trivial.  Aortic valve sclerosis is present, with no evidence of aortic valve  stenosis. Aortic regurgitation PHT measures 518 msec.   6. The inferior vena cava is dilated in size with >50% respiratory  variability, suggesting right atrial pressure of 8 mmHg.     Neuro/Psych negative neurological ROS  negative psych ROS   GI/Hepatic negative GI ROS, Neg liver ROS,,,  Endo/Other  negative endocrine ROS    Renal/GU negative Renal ROS     Musculoskeletal  (+) Arthritis ,    Abdominal   Peds  Hematology  (+) Blood dyscrasia Lab Results       Component                Value               Date                      WBC                      5.7                 01/14/2023                HGB                      14.9                01/14/2023                HCT                      45.2                01/14/2023                MCV                      97                  01/14/2023  PLT                      194                 01/14/2023             eliquis   Anesthesia Other Findings   Reproductive/Obstetrics                              Anesthesia Physical Anesthesia Plan  ASA: 2  Anesthesia Plan: General   Post-op Pain Management: Minimal or no pain anticipated   Induction: Intravenous  PONV Risk Score and Plan: 3 and Ondansetron and Dexamethasone  Airway Management Planned: Oral ETT  Additional Equipment: None  Intra-op Plan:   Post-operative Plan: Extubation in OR  Informed Consent: I have reviewed the patients History and Physical, chart, labs and discussed the procedure including the risks, benefits and alternatives for the proposed anesthesia with the patient or authorized representative who has indicated his/her understanding and acceptance.     Dental advisory given  Plan Discussed with: CRNA  Anesthesia Plan Comments:          Anesthesia Quick Evaluation

## 2023-01-29 NOTE — Transfer of Care (Signed)
Immediate Anesthesia Transfer of Care Note  Patient: [name] [name]  Procedure(s) Performed: ATRIAL FIBRILLATION ABLATION  Patient Location: PACU  Anesthesia Type:General  Level of Consciousness: awake, alert , and oriented  Airway & Oxygen Therapy: Patient Spontanous Breathing and Patient connected to nasal cannula oxygen  Post-op Assessment: Report given to RN and Post -op Vital signs reviewed and stable  Post vital signs: Reviewed and stable  Last Vitals:  Vitals Value Taken Time  BP 99/63 01/29/23 1639  Temp    Pulse 59 01/29/23 1639  Resp 20 01/29/23 1639  SpO2 95 % 01/29/23 1639  Vitals shown include unvalidated device data.  Last Pain:  Vitals:   01/29/23 1138  TempSrc: Temporal  PainSc:          Complications: No notable events documented.

## 2023-01-29 NOTE — Progress Notes (Signed)
Pt ambulated to and from bathroom with no signs of oozing from bilateral groin sites

## 2023-01-29 NOTE — Anesthesia Procedure Notes (Signed)
Procedure Name: Intubation Date/Time: 01/29/2023 2:18 PM  Performed by: Inda Coke, CRNAPre-anesthesia Checklist: Patient identified, Emergency Drugs available, Suction available, Timeout performed and Patient being monitored Patient Re-evaluated:Patient Re-evaluated prior to induction Oxygen Delivery Method: Circle system utilized Preoxygenation: Pre-oxygenation with 100% oxygen Induction Type: IV induction Ventilation: Mask ventilation without difficulty Laryngoscope Size: Mac and 3 Grade View: Grade I Tube type: Oral Tube size: 7.0 mm Number of attempts: 1 Airway Equipment and Method: Stylet Placement Confirmation: ETT inserted through vocal cords under direct vision, positive ETCO2, CO2 detector and breath sounds checked- equal and bilateral Secured at: 22 cm Tube secured with: Tape Dental Injury: Teeth and Oropharynx as per pre-operative assessment

## 2023-01-29 NOTE — Discharge Instructions (Signed)

## 2023-01-29 NOTE — H&P (Signed)
Electrophysiology Office Note:    Date:  01/29/2023   ID:  [name] [name], [name] [name] 1951-04-12, MRN [id number]  PCP:  Iona Beard, [place] Providers Cardiologist:  Pixie Casino, MD Electrophysiologist:  Melida Quitter, MD     Referring MD: No ref. provider found   History of Present Illness:    [name] [name] is a 72 y.o. female with a hx listed below, significant for hypertension and atrial fibrillation, referred for arrhythmia management.  The patient was originally diagnosed with atrial fibrillation in 2017.  She had been doing well but was incidentally found to be in atrial fibrillation during an dentist visit in April 2023.  She was discharged in rate controlled atrial fibrillation but subsequently admitted with heart failure and atrial fibrillation.  She has since undergone DC cardioversions and April and, after additional recurrences, in November and December 2023.    She sensed that her AF recurred a few days after her most recent cardioversion. She has palpitations, fatigue, shortness of breath but no chest pain, syncope, pre-syncope.  Past Medical History:  Diagnosis Date   A-fib ([place])    Anemia    during pneumonia   Arthritis    Asthma    Hypertension     Past Surgical History:  Procedure Laterality Date   c section     1973 and 1983   CARDIOVERSION N/A 03/15/2022   Procedure: CARDIOVERSION;  Surgeon: Elouise Munroe, MD;  Location: [place];  Service: Cardiovascular;  Laterality: N/A;  1340 shocked '@120j'$ , NSR   CARDIOVERSION N/A 11/22/2022   Procedure: CARDIOVERSION;  Surgeon: Pixie Casino, MD;  Location: [place];  Service: Cardiovascular;  Laterality: N/A;   COLONOSCOPY     ORIF ANKLE FRACTURE Left 08/16/2016   ORIF ANKLE FRACTURE Left 08/16/2016   Procedure: OPEN REDUCTION INTERNAL FIXATION (ORIF) LEFT BIMALLEOLAR ANKLE FRACTURE;  Surgeon: Mcarthur Rossetti, MD;  Location: [facility];  Service: Orthopedics;  Laterality: Left;    TUBAL LIGATION      Current Medications: Current Meds  Medication Sig   acetaminophen (TYLENOL) 500 MG tablet Take 1,000 mg by mouth every 6 (six) hours as needed for mild pain or headache.   apixaban (ELIQUIS) 5 MG TABS tablet Take 1 tablet (5 mg total) by mouth 2 (two) times daily.   APPLE CIDER VINEGAR PO Take 1 tablet by mouth every morning.   busPIRone (BUSPAR) 5 MG tablet Take 5 mg by mouth 2 (two) times daily.    cholecalciferol (VITAMIN D) 1000 units tablet Take 2,000 Units by mouth daily.   diltiazem (CARDIZEM CD) 180 MG 24 hr capsule Take 1 capsule (180 mg total) by mouth daily.   flecainide (TAMBOCOR) 50 MG tablet Take 1 tablet (50 mg total) by mouth 2 (two) times daily.   furosemide (LASIX) 20 MG tablet Take 1 tablet (20 mg total) by mouth daily as needed.   Magnesium 400 MG CAPS Take 400 mg by mouth every morning.   metoprolol tartrate (LOPRESSOR) 25 MG tablet Take 1.5 tablets (37.5 mg total) by mouth 2 (two) times daily.   Multiple Vitamin (MULTIVITAMIN WITH MINERALS) TABS tablet Take 1 tablet by mouth daily.   Potassium 99 MG TABS Take 99 mg by mouth daily.     Allergies:   Patient has no known allergies.   Social History   Socioeconomic History   Marital status: Single    Spouse name: Not on file   Number of children: Not on  file   Years of education: Not on file   Highest education level: Not on file  Occupational History   Not on file  Tobacco Use   Smoking status: Never   Smokeless tobacco: Never   Tobacco comments:    Never smoke 11/07/22  Vaping Use   Vaping Use: Never used  Substance and Sexual Activity   Alcohol use: No   Drug use: No   Sexual activity: Not Currently  Other Topics Concern   Not on file  Social History Narrative   Not on file   Social Determinants of Health   Financial Resource Strain: Not on file  Food Insecurity: Not on file  Transportation Needs: Not on file  Physical Activity: Not on file  Stress: Not on file  Social  Connections: Not on file     Family History: The patient's family history includes Asthma in her father; Atrial fibrillation in her mother; Heart attack in her father; Hypertension in her mother.  ROS:   Please see the history of present illness.    All other systems reviewed and are negative.  EKGs/Labs/Other Studies Reviewed Today:     TTE: March 16, 2022 EF 60-65%. Moderately dilated LA  EKG:  Last EKG results: today - Atrial fibrillation   Recent Labs: 03/11/2022: B Natriuretic Peptide 297.3; TSH 3.685 07/19/2022: ALT 14 11/05/2022: Magnesium 1.8 01/14/2023: BUN 13; Creatinine, Ser 0.83; Hemoglobin 14.9; Platelets 194; Potassium 4.4; Sodium 141     Physical Exam:    VS:  BP (!) 120/101   Pulse (!) 112   Temp (!) 97.1 F (36.2 C) (Temporal)   Resp 16   Ht '5\' 3"'$  (1.6 m)   Wt 96.6 kg   SpO2 97%   BMI 37.73 kg/m     Wt Readings from Last 3 Encounters:  01/29/23 96.6 kg  01/08/23 97.5 kg  12/10/22 98.9 kg     GEN: Well nourished, well developed in no acute distress CARDIAC: iRRR, no murmurs, rubs, gallops RESPIRATORY:  Normal work of breathing MUSCULOSKELETAL: no edema    ASSESSMENT & PLAN:    Atrial fibrillation, persistent: symptomatic, failed flecainide. Will proceed with ablation today.  Secondary hyercoagulable state: continue eliquis Obesity: I informed her that her risks of maintaining sinus rhythm will be significantly higher if she loses weight. I encouraged her to monitor her caloric intake and eat a higher proportion for vegetables and fruits and avoid fats and refined carbohydrates. Sleep apnea: therapy for OSA has been recommended       Medication Adjustments/Labs and Tests Ordered: Current medicines are reviewed at length with the patient today.  Concerns regarding medicines are outlined above.  Orders Placed This Encounter  Procedures   Diet NPO time specified   Informed Consent Details: Physician/Practitioner Attestation; Transcribe to consent  form and obtain patient signature   Initiate Pre-op Protocol   Void on call to EP Lab   Confirm CBC and BMP (or CMP) results within 7 days for inpatient and 30 days for outpatient:   Clip right and left femoral area PM before surgery   Clip right internal jugular area PM before surgery   Pre-admission testing diagnosis   EP STUDY   Insert peripheral IV   Meds ordered this encounter  Medications   0.9 %  sodium chloride infusion     Signed, Melida Quitter, MD  01/29/2023 1:23 PM    [place]

## 2023-01-30 ENCOUNTER — Encounter (HOSPITAL_COMMUNITY): Payer: Self-pay | Admitting: Cardiovascular Disease

## 2023-01-30 NOTE — Anesthesia Postprocedure Evaluation (Signed)
Anesthesia Post Note  Patient: [name] [name]  Procedure(s) Performed: ATRIAL FIBRILLATION ABLATION     Patient location during evaluation: PACU Anesthesia Type: General Level of consciousness: awake and alert Pain management: pain level controlled Vital Signs Assessment: post-procedure vital signs reviewed and stable Respiratory status: spontaneous breathing, nonlabored ventilation, respiratory function stable and patient connected to nasal cannula oxygen Cardiovascular status: blood pressure returned to baseline and stable Postop Assessment: no apparent nausea or vomiting Anesthetic complications: no   No notable events documented.  Last Vitals:  Vitals:   01/29/23 1930 01/29/23 2000  BP: 108/76 108/76  Pulse: 63 62  Resp: (!) 22 (!) 30  Temp:    SpO2: 95% 94%    Last Pain:  Vitals:   01/29/23 1750  TempSrc:   PainSc: 3                  Desire Fulp P Haydin Calandra

## 2023-02-01 ENCOUNTER — Telehealth: Payer: Self-pay | Admitting: *Deleted

## 2023-02-01 DIAGNOSIS — I5032 Chronic diastolic (congestive) heart failure: Secondary | ICD-10-CM

## 2023-02-01 DIAGNOSIS — G4733 Obstructive sleep apnea (adult) (pediatric): Secondary | ICD-10-CM

## 2023-02-01 DIAGNOSIS — I10 Essential (primary) hypertension: Secondary | ICD-10-CM

## 2023-02-01 NOTE — Telephone Encounter (Signed)
-----   Message from Lauralee Evener, [place] sent at 01/07/2023 11:04 AM EST -----  ----- Message ----- From: Sueanne Margarita, MD Sent: 01/07/2023   9:23 AM EST To: Cv Div Sleep Studies  Please let patient know that they have sleep apnea.  Recommend therapeutic CPAP titration for treatment of patient's sleep disordered breathing.  If unable to perform an in lab titration then initiate ResMed auto CPAP from 4 to 15cm H2O with heated humidity and mask of choice and overnight pulse ox on CPAP.

## 2023-02-01 NOTE — Telephone Encounter (Signed)
The patient has been notified of the result and verbalized understanding.  All questions (if any) were answered. Marolyn Hammock, [place] [id number] [id number] PM    Precert titration

## 2023-02-11 DIAGNOSIS — I10 Essential (primary) hypertension: Secondary | ICD-10-CM | POA: Diagnosis not present

## 2023-02-11 DIAGNOSIS — F4321 Adjustment disorder with depressed mood: Secondary | ICD-10-CM | POA: Diagnosis not present

## 2023-02-11 DIAGNOSIS — E669 Obesity, unspecified: Secondary | ICD-10-CM | POA: Diagnosis not present

## 2023-02-11 DIAGNOSIS — E7800 Pure hypercholesterolemia, unspecified: Secondary | ICD-10-CM | POA: Diagnosis not present

## 2023-02-11 DIAGNOSIS — R001 Bradycardia, unspecified: Secondary | ICD-10-CM | POA: Diagnosis not present

## 2023-02-11 DIAGNOSIS — Z6837 Body mass index (BMI) 37.0-37.9, adult: Secondary | ICD-10-CM | POA: Diagnosis not present

## 2023-02-11 DIAGNOSIS — I480 Paroxysmal atrial fibrillation: Secondary | ICD-10-CM | POA: Diagnosis not present

## 2023-02-19 NOTE — Addendum Note (Signed)
Addended by: Freada Bergeron on: 02/19/2023 12:53 PM   Modules accepted: Orders

## 2023-02-26 ENCOUNTER — Ambulatory Visit (HOSPITAL_COMMUNITY)
Admission: RE | Admit: 2023-02-26 | Discharge: 2023-02-26 | Disposition: A | Discharge status: Home / Self Care | Payer: No Typology Code available for payment source | Source: Ambulatory Visit | Attending: Physician Assistant | Admitting: Physician Assistant

## 2023-02-26 VITALS — BP 122/68 | HR 46 | Ht 63.0 in | Wt 215.0 lb

## 2023-02-26 DIAGNOSIS — Z7901 Long term (current) use of anticoagulants: Secondary | ICD-10-CM | POA: Diagnosis not present

## 2023-02-26 DIAGNOSIS — Z79899 Other long term (current) drug therapy: Secondary | ICD-10-CM

## 2023-02-26 DIAGNOSIS — I4819 Other persistent atrial fibrillation: Secondary | ICD-10-CM | POA: Insufficient documentation

## 2023-02-26 DIAGNOSIS — G4733 Obstructive sleep apnea (adult) (pediatric): Secondary | ICD-10-CM | POA: Diagnosis not present

## 2023-02-26 DIAGNOSIS — E669 Obesity, unspecified: Secondary | ICD-10-CM | POA: Diagnosis not present

## 2023-02-26 DIAGNOSIS — D6869 Other thrombophilia: Secondary | ICD-10-CM | POA: Insufficient documentation

## 2023-02-26 DIAGNOSIS — Z6838 Body mass index (BMI) 38.0-38.9, adult: Secondary | ICD-10-CM | POA: Insufficient documentation

## 2023-02-26 DIAGNOSIS — Z5181 Encounter for therapeutic drug level monitoring: Secondary | ICD-10-CM | POA: Diagnosis not present

## 2023-02-26 MED ORDER — FLECAINIDE ACETATE 50 MG PO TABS: 50.0000 mg | ORAL_TABLET | Freq: Two times a day (BID) | ORAL | 1 refills | Status: DC

## 2023-02-26 NOTE — Progress Notes (Signed)
Primary Care Physician: Iona Beard, MD Primary Cardiologist: Dr Debara Pickett Primary Electrophysiologist: Dr Myles Gip Referring Physician: [facility]   [name] [name] is a 72 y.o. female with a history of HTN, OSA, asthma, atrial fibrillation who presents for follow up in the [facility].  The patient was initially diagnosed with atrial fibrillation in 2017. She had done well until 02/2022 when she was admitted for incidentally found afib with RVR from the dentist office. She was discharged in rate controlled afib but was readmitted 03/11/22 with afib and CHF. She underwent DCCV at that time. Patient is on Eliquis for a CHADS2VASC score of 3. She was seen in the ED 10/20/22 for afib and underwent DCCV again. She denies significant alcohol use. At her visit in the AF clinic 10/29/22 she deferred decision about rhythm control until her visit with Dr Debara Pickett.  Patient was seen again at that ED 11/05/22 with tachypalpitations. She was rate controlled in the ED and did not undergo DCCV at that time. She was started on flecainide and underwent DCCV on 11/22/22 but had quick return of her afib. She was seen by Dr Myles Gip and underwent afib ablation 01/29/23.  On follow up today, patient reports that she did have intermittent palpitations for the first two weeks post ablation but these have resolved. She denies chest pain, swallowing pain, or groin issues.   Today, she denies symptoms of chest pain, shortness of breath, orthopnea, PND, lower extremity edema, dizziness, presyncope, syncope, bleeding, or neurologic sequela. The patient is tolerating medications without difficulties and is otherwise without complaint today.    Atrial Fibrillation Risk Factors:  she does have symptoms or diagnosis of sleep apnea. she is pending CPAP titration.  she does not have a history of rheumatic fever. she does not have a history of alcohol use. The patient does have a history of early familial  atrial fibrillation or other arrhythmias. Mother had afib.  she has a BMI of Body mass index is 38.09 kg/m.Marland Kitchen Filed Weights   02/26/23 1101  Weight: 97.5 kg    Family History  Problem Relation Age of Onset   Atrial fibrillation Mother    Hypertension Mother    Asthma Father    Heart attack Father      Atrial Fibrillation Management history:  Previous antiarrhythmic drugs: flecainide  Previous cardioversions: 03/15/22, 10/20/22, 11/22/22 Previous ablations: 01/29/23 CHADS2VASC score: 3 Anticoagulation history: Eliquis   Past Medical History:  Diagnosis Date   A-fib ([place])    Anemia    during pneumonia   Arthritis    Asthma    Hypertension    Past Surgical History:  Procedure Laterality Date   ATRIAL FIBRILLATION ABLATION N/A 01/29/2023   Procedure: ATRIAL FIBRILLATION ABLATION;  Surgeon: Melida Quitter, MD;  Location: [facility];  Service: Cardiovascular;  Laterality: N/A;   c section     1973 and 1983   CARDIOVERSION N/A 03/15/2022   Procedure: CARDIOVERSION;  Surgeon: Elouise Munroe, MD;  Location: [place];  Service: Cardiovascular;  Laterality: N/A;  1340 shocked @120j , NSR   CARDIOVERSION N/A 11/22/2022   Procedure: CARDIOVERSION;  Surgeon: Pixie Casino, MD;  Location: [facility];  Service: Cardiovascular;  Laterality: N/A;   COLONOSCOPY     ORIF ANKLE FRACTURE Left 08/16/2016   ORIF ANKLE FRACTURE Left 08/16/2016   Procedure: OPEN REDUCTION INTERNAL FIXATION (ORIF) LEFT BIMALLEOLAR ANKLE FRACTURE;  Surgeon: Mcarthur Rossetti, MD;  Location: [place];  Service:  Orthopedics;  Laterality: Left;   TUBAL LIGATION      Current Outpatient Medications  Medication Sig Dispense Refill   acetaminophen (TYLENOL) 500 MG tablet Take 1,000 mg by mouth every 6 (six) hours as needed for mild pain or headache.     apixaban (ELIQUIS) 5 MG TABS tablet Take 1 tablet (5 mg total) by mouth 2 (two) times daily. 60 tablet 3   APPLE CIDER VINEGAR PO Take 1  tablet by mouth every morning.     busPIRone (BUSPAR) 5 MG tablet Take 5 mg by mouth 2 (two) times daily.      cholecalciferol (VITAMIN D) 1000 units tablet Take 2,000 Units by mouth daily.     diltiazem (CARDIZEM CD) 180 MG 24 hr capsule Take 1 capsule (180 mg total) by mouth daily. 30 capsule 6   furosemide (LASIX) 20 MG tablet Take 1 tablet (20 mg total) by mouth daily as needed. 30 tablet 6   Magnesium 400 MG CAPS Take 400 mg by mouth every morning.     metoprolol tartrate (LOPRESSOR) 25 MG tablet Take 1.5 tablets (37.5 mg total) by mouth 2 (two) times daily. 90 tablet 6   Multiple Vitamin (MULTIVITAMIN WITH MINERALS) TABS tablet Take 1 tablet by mouth daily.     pantoprazole (PROTONIX) 40 MG tablet Take 1 tablet (40 mg total) by mouth daily. 45 tablet 0   Potassium 99 MG TABS Take 99 mg by mouth daily.     flecainide (TAMBOCOR) 50 MG tablet Take 1 tablet (50 mg total) by mouth 2 (two) times daily. 60 tablet 1   No current facility-administered medications for this encounter.    No Known Allergies  Social History   Socioeconomic History   Marital status: Single    Spouse name: Not on file   Number of children: Not on file   Years of education: Not on file   Highest education level: Not on file  Occupational History   Not on file  Tobacco Use   Smoking status: Never   Smokeless tobacco: Never   Tobacco comments:    Never smoke 11/07/22  Vaping Use   Vaping Use: Never used  Substance and Sexual Activity   Alcohol use: No   Drug use: No   Sexual activity: Not Currently  Other Topics Concern   Not on file  Social History Narrative   Not on file   Social Determinants of Health   Financial Resource Strain: Not on file  Food Insecurity: Not on file  Transportation Needs: Not on file  Physical Activity: Not on file  Stress: Not on file  Social Connections: Not on file  Intimate Partner Violence: Not on file     ROS- All systems are reviewed and negative except as  per the HPI above.  Physical Exam: Vitals:   02/26/23 1101  BP: 122/68  Pulse: (!) 46  Weight: 97.5 kg  Height: 5\' 3"  (1.6 m)    GEN- The patient is a well appearing female, alert and oriented x 3 today.   HEENT-head normocephalic, atraumatic, sclera clear, conjunctiva pink, hearing intact, trachea midline. Lungs- Clear to ausculation bilaterally, normal work of breathing Heart- Regular rate and rhythm, no murmurs, rubs or gallops  GI- soft, NT, ND, + BS Extremities- no clubbing, cyanosis, or edema MS- no significant deformity or atrophy Skin- no rash or lesion Psych- euthymic mood, full affect Neuro- strength and sensation are intact   Wt Readings from Last 3 Encounters:  02/26/23 97.5 kg  01/29/23 96.6 kg  01/08/23 97.5 kg    EKG today demonstrates  SB, 1st degree AV block, LAFB Vent. rate 46 BPM PR interval 226 ms QRS duration 90 ms QT/QTcB 506/442 ms  Echo 03/01/22 demonstrated   1. Left ventricular ejection fraction, by estimation, is 60 to 65%. Left  ventricular ejection fraction by 2D MOD biplane is 61.6 %. The left  ventricle has normal function. The left ventricle has no regional wall  motion abnormalities. There is mild left ventricular hypertrophy. Left ventricular diastolic function could not be evaluated.   2. Right ventricular systolic function is normal. The right ventricular  size is normal. There is mildly elevated pulmonary artery systolic  pressure. The estimated right ventricular systolic pressure is [id number] mmHg.   3. Left atrial size was moderately dilated.   4. The mitral valve is grossly normal. Mild mitral valve regurgitation.   5. The aortic valve is tricuspid. Aortic valve regurgitation is trivial.  Aortic valve sclerosis is present, with no evidence of aortic valve  stenosis. Aortic regurgitation PHT measures 518 msec.   6. The inferior vena cava is dilated in size with >50% respiratory  variability, suggesting right atrial pressure of 8 mmHg.    Epic records are reviewed at length today  CHA2DS2-VASc Score = 4  The patient's score is based upon: CHF History: 1 HTN History: 1 Diabetes History: 0 Stroke History: 0 Vascular Disease History: 0 Age Score: 1 Gender Score: 1       ASSESSMENT AND PLAN: 1. Persistent Atrial Fibrillation (ICD10:  I48.19) The patient's CHA2DS2-VASc score is 4, indicating a 4.8% annual risk of stroke.   S/p DCCV 11/22/22 with quick return of afib despite flecainide.  S/p afib ablation 01/29/23 Patient appears to be maintaining SR. Continue flecainide 50 mg BID for now.  Continue Eliquis 5 mg BID with no missed doses for 3 months post ablation.  Continue diltiazem 180 mg daily Continue Lopressor 37.5 mg BID Patient asymptomatic with her bradycardia.   2. Secondary Hypercoagulable State (ICD10:  D68.69) The patient is at significant risk for stroke/thromboembolism based upon her CHA2DS2-VASc Score of 4.  Continue Apixaban (Eliquis).   3. Obesity Body mass index is 38.09 kg/m. Lifestyle modification was discussed and encouraged including regular physical activity and weight reduction.  4. OSA Pending CPAP titration 4/26   Follow up with Dr Myles Gip as scheduled.    [facility] [address] [place], [place] [address] [phone] 02/26/2023 2:00 PM

## 2023-03-08 ENCOUNTER — Ambulatory Visit: Payer: No Typology Code available for payment source | Admitting: Cardiovascular Disease

## 2023-03-22 ENCOUNTER — Ambulatory Visit: Payer: No Typology Code available for payment source | Attending: Cardiology | Admitting: Cardiology

## 2023-03-22 DIAGNOSIS — I110 Hypertensive heart disease with heart failure: Secondary | ICD-10-CM | POA: Insufficient documentation

## 2023-03-22 DIAGNOSIS — G4733 Obstructive sleep apnea (adult) (pediatric): Secondary | ICD-10-CM

## 2023-03-22 DIAGNOSIS — I5032 Chronic diastolic (congestive) heart failure: Secondary | ICD-10-CM | POA: Insufficient documentation

## 2023-03-22 DIAGNOSIS — I10 Essential (primary) hypertension: Secondary | ICD-10-CM

## 2023-03-24 NOTE — Procedures (Signed)
   Patient Name: [name] [name], Hyneman Date: 03/22/2023 Gender: Female D.O.B: Apr 10, 1951 Age (years): 59 Referring Provider: Armanda Magic MD, ABSM Height (inches): 63 Interpreting Physician: Armanda Magic MD, ABSM Weight (lbs): 215 RPSGT: Peak, Robert BMI: 38 MRN: [id number]  CLINICAL INFORMATION The patient is referred for a CPAP titration to treat sleep apnea.  SLEEP STUDY TECHNIQUE As per the AASM Manual for the Scoring of Sleep and Associated Events v2.3 (April 2016) with a hypopnea requiring 4% desaturations.  The channels recorded and monitored were frontal, central and occipital EEG, electrooculogram (EOG), submentalis EMG (chin), nasal and oral airflow, thoracic and abdominal wall motion, anterior tibialis EMG, snore microphone, electrocardiogram, and pulse oximetry. Continuous positive airway pressure (CPAP) was initiated at the beginning of the study and titrated to treat sleep-disordered breathing.  MEDICATIONS Medications self-administered by patient taken the night of the study : N/A  TECHNICIAN COMMENTS Comments added by technician: PATIENT HAS DIFFICULTY MAINTAINING SLEEP DURING STUDY. Comments added by scorer: N/A  RESPIRATORY PARAMETERS Optimal PAP Pressure (cm): 5  AHI at Optimal Pressure (/hr):0 Overall Minimal O2 (%):89.00  Supine % at Optimal Pressure (%):38 Minimal O2 at Optimal Pressure (%): 89.0   SLEEP ARCHITECTURE The study was initiated at 10:02:40 PM and ended at 6:00:36 AM.  Sleep onset time was 62.0 minutes and the sleep efficiency was 22.1%. The total sleep time was 105.4 minutes.  The patient spent 13.28% of the night in stage N1 sleep, 81.50% in stage N2 sleep, 4.74% in stage N3 and 0.5% in REM.Stage REM latency was 95.0 minutes  Wake after sleep onset was 310.5. Alpha intrusion was absent. Supine sleep was 38.33%.  CARDIAC DATA The 2 lead EKG demonstrated sinus rhythm. The mean heart rate was 103.55 beats per minute. Other EKG findings  include: None.  LEG MOVEMENT DATA The total Periodic Limb Movements of Sleep (PLMS) were 0. The PLMS index was 0.00. A PLMS index of <15 is considered normal in adults.  IMPRESSIONS - The optimal PAP pressure was 5 cm of water. - Mild oxygen desaturations were observed during this titration (min O2 = 89.00%). - No snoring was audible during this study. - No cardiac abnormalities were observed during this study. - Clinically significant periodic limb movements were not noted during this study. Arousals associated with PLMs were rare.  DIAGNOSIS - Obstructive Sleep Apnea (G47.33)  RECOMMENDATIONS - Trial of ResMed CPAP therapy on 5 cm H2O with a Medium size Resmed Nasal Cradle AirFit N30 mask and heated humidification. - Avoid alcohol, sedatives and other CNS depressants that may worsen sleep apnea and disrupt normal sleep architecture. - Sleep hygiene should be reviewed to assess factors that may improve sleep quality. - Weight management and regular exercise should be initiated or continued. - Return to Sleep Center for re-evaluation after 4 weeks of therapy  [Electronically signed] 03/24/2023 09:11 PM  Armanda Magic MD, ABSM Diplomate, American Board of Sleep Medicine

## 2023-03-26 ENCOUNTER — Telehealth: Payer: Self-pay | Admitting: *Deleted

## 2023-03-26 DIAGNOSIS — I5032 Chronic diastolic (congestive) heart failure: Secondary | ICD-10-CM

## 2023-03-26 DIAGNOSIS — G4733 Obstructive sleep apnea (adult) (pediatric): Secondary | ICD-10-CM

## 2023-03-26 DIAGNOSIS — I10 Essential (primary) hypertension: Secondary | ICD-10-CM

## 2023-03-26 NOTE — Telephone Encounter (Signed)
The patient has been notified of the result. Left detailed message on voicemail and informed patient to call back..Kadia Abaya Green, CMA   

## 2023-03-26 NOTE — Addendum Note (Signed)
Addended by: Reesa Chew on: 03/26/2023 04:19 PM   Modules accepted: Orders

## 2023-03-26 NOTE — Telephone Encounter (Signed)
-----   Message from Gaynelle Cage, CMA sent at 03/25/2023  7:57 AM EDT -----  ----- Message ----- From: Quintella Reichert, MD Sent: 03/24/2023   9:14 PM EDT To: Cv Div Sleep Studies  Please let patient know that they had a successful PAP titration and let DME know that orders are in EPIC.  Please set up 6 week OV with me.

## 2023-03-26 NOTE — Telephone Encounter (Signed)
Return call: The patient has been notified of the result and verbalized understanding.  All questions (if any) were answered. Latrelle Dodrill, CMA 03/26/2023 4:16 PM    Upon patient request DME selection is [facility]. Patient understands he will be contacted by [facility] to set up his cpap. Patient understands to call if [facility] does not contact him with new setup in a timely manner. Patient understands they will be called once confirmation has been received from [facility]/ that they have received their new machine to schedule 10 week follow up appointment.   [facility] notified of new cpap order  Please add to airview Patient was grateful for the call and thanked me.

## 2023-04-27 ENCOUNTER — Other Ambulatory Visit: Payer: Self-pay | Admitting: Student

## 2023-04-29 ENCOUNTER — Other Ambulatory Visit: Payer: Self-pay | Admitting: Internal Medicine

## 2023-04-29 MED ORDER — DILTIAZEM HCL ER COATED BEADS 180 MG PO CP24: 180.0000 mg | ORAL_CAPSULE | Freq: Every day | ORAL | 2 refills | Status: DC

## 2023-04-29 NOTE — Telephone Encounter (Signed)
Prescription refill request for Eliquis received. Indication: afib  Last office visit: 02/26/2023, [place]  Scr:  0.83, 01/14/2023 Age: 72 yo  Weight: 97.5 kg   Refill sent.

## 2023-04-29 NOTE — Telephone Encounter (Signed)
Pt's medication was sent to pt's pharmacy as requested. Confirmation received.  °

## 2023-04-29 NOTE — Telephone Encounter (Signed)
*  STAT* If patient is at the pharmacy, call can be transferred to refill team.   1. Which medications need to be refilled? (please list name of each medication and dose if known) diltiazem (CARDIZEM CD) 180 MG 24 hr capsule   2. Which pharmacy/location (including street and city if local pharmacy) is medication to be sent to?  [facility] [address], [place] - [address]    3. Do they need a 30 day or 90 day supply? 90    *STAT* If patient is at the pharmacy, call can be transferred to refill team.   1. Which medications need to be refilled? (please list name of each medication and dose if known) ELIQUIS 5 MG TABS tablet  2. Which pharmacy/location (including street and city if local pharmacy) is medication to be sent to?  [facility] [address] [place] - [address]    3. Do they need a 30 day or 90 day supply? 30

## 2023-05-10 ENCOUNTER — Other Ambulatory Visit: Payer: Self-pay | Admitting: Internal Medicine

## 2023-05-10 MED ORDER — FLECAINIDE ACETATE 50 MG PO TABS: 50.0000 mg | ORAL_TABLET | Freq: Two times a day (BID) | ORAL | 1 refills | Status: DC

## 2023-05-10 NOTE — Progress Notes (Signed)
Patient contacted after hours cardiology line to discuss her atrial fibrillation.  Patient reported that she noticed her heart racing with a high of 117 bpm.  She was taking flecainide but her last dose was yesterday and she didn't have any additional tablets.  I renewed the medication.  Patient is to follow up with electrophysiology soon to discuss further care.

## 2023-05-14 ENCOUNTER — Encounter: Payer: Self-pay | Admitting: Cardiovascular Disease

## 2023-05-14 ENCOUNTER — Ambulatory Visit
Payer: No Typology Code available for payment source | Attending: Cardiovascular Disease | Admitting: Cardiovascular Disease

## 2023-05-14 VITALS — BP 132/70 | HR 87 | Ht 63.0 in | Wt 217.6 lb

## 2023-05-14 DIAGNOSIS — I4819 Other persistent atrial fibrillation: Secondary | ICD-10-CM

## 2023-05-14 MED ORDER — FLECAINIDE ACETATE 50 MG PO TABS: 50.0000 mg | ORAL_TABLET | Freq: Two times a day (BID) | ORAL | 3 refills | Status: DC

## 2023-05-14 NOTE — H&P (View-Only) (Signed)
  Electrophysiology Office Note:    Date:  05/14/2023   ID:  [name] [name], DOB 01/04/1951, MRN [id number]  PCP:  Hill, Gerald, MD   [facility] Providers Cardiologist:  Kenneth C Hilty, MD Electrophysiologist:  Arieonna Medine E Jerold Yoss, MD     Referring MD: Hill, Gerald, MD   History of Present Illness:    [name] [name] is a 72 y.o. female with a hx listed below, significant for hypertension and atrial fibrillation, referred for arrhythmia management.  The patient was originally diagnosed with atrial fibrillation in 2017.  She had been doing well but was incidentally found to be in atrial fibrillation during an dentist visit in April 2023.  She was discharged in rate controlled atrial fibrillation but subsequently admitted with heart failure and atrial fibrillation.  She has since undergone DC cardioversions and April and, after additional recurrences, in November and December 2023.    She has palpitations, fatigue, shortness of breath but no chest pain, syncope, pre-syncope.  She underwent A-fib ablation on January 29, 2023.  She has done well until recently.  She ran of her flecainide and missed a dose on Friday and then had recurrence later that afternoon.  She has remained in atrial fibrillation since.    EKGs/Labs/Other Studies Reviewed Today:     TTE: 02/2022 EF 60-65%. Moderately dilated LA  EKG:  Last EKG results: today - Atrial fibrillation   Recent Labs: 07/19/2022: ALT 14 11/05/2022: Magnesium 1.8 01/14/2023: BUN 13; Creatinine, Ser 0.83; Hemoglobin 14.9; Platelets 194; Potassium 4.4; Sodium 141     Physical Exam:    VS:  BP 132/70   Pulse 87   Ht 5' 3" (1.6 m)   Wt 217 lb 9.6 oz (98.7 kg)   SpO2 98%   BMI 38.55 kg/m     Wt Readings from Last 3 Encounters:  05/14/23 217 lb 9.6 oz (98.7 kg)  02/26/23 215 lb (97.5 kg)  01/29/23 213 lb (96.6 kg)     GEN: Well nourished, well developed in no acute distress CARDIAC: iRRR, no murmurs, rubs, gallops RESPIRATORY:   Normal work of breathing MUSCULOSKELETAL: no edema    ASSESSMENT & PLAN:    Atrial fibrillation, persistent: symptomatic, failed flecainide.  Status post atrial fibrillation ablation January 29, 2023; she required DC cardioversion during the ablation. AF recurred after missing flecainide last week Schedule DC cardioversion Continue flecainide Schedule repeat ablation --I will see her again prior to repeat ablation  Secondary hyercoagulable state: continue eliquis Obesity: I informed her that her risks of maintaining sinus rhythm will be significantly higher if she loses weight. I encouraged her to monitor her caloric intake and eat a higher proportion for vegetables and fruits and avoid fats and refined carbohydrates. Suspected sleep apnea: will order home sleep test.       Medication Adjustments/Labs and Tests Ordered: Current medicines are reviewed at length with the patient today.  Concerns regarding medicines are outlined above.  Orders Placed This Encounter  Procedures   EKG 12-Lead   No orders of the defined types were placed in this encounter.    Signed, Lejend Dalby E Melford Tullier, MD  05/14/2023 11:36 AM    [facility] 

## 2023-05-14 NOTE — Patient Instructions (Signed)
Medication Instructions:  Flecainide refills sent in to pharmacy *If you need a refill on your cardiac medications before your next appointment, please call your pharmacy*   Testing/Procedures: Direct Current Cardioversion Your physician has recommended that you have a Cardioversion (DCCV). Electrical Cardioversion uses a jolt of electricity to your heart either through paddles or wired patches attached to your chest. This is a controlled, usually prescheduled, procedure. Defibrillation is done under light anesthesia in the hospital, and you usually go home the day of the procedure. This is done to get your heart back into a normal rhythm. You are not awake for the procedure. Please see the instruction sheet given to you today.  Atrial Fibrillation Ablation  Your physician has recommended that you have an ablation. Catheter ablation is a medical procedure used to treat some cardiac arrhythmias (irregular heartbeats). During catheter ablation, a long, thin, flexible tube is put into a blood vessel in your groin (upper thigh), or neck. This tube is called an ablation catheter. It is then guided to your heart through the blood vessel. Radio frequency waves destroy small areas of heart tissue where abnormal heartbeats may cause an arrhythmia to start. Please see the instruction sheet given to you today.  You are scheduled for Atrial Fibrillation Ablation on Thursday, September 5 with Dr. Halford Chessman.Please arrive at the Main Entrance A at [facility]: [address] [place], [place] [address]     Follow-Up: At [facility], you and your health needs are our priority.  As part of our continuing mission to provide you with exceptional heart care, we have created designated Provider Care Teams.  These Care Teams include your primary Cardiologist (physician) and Advanced Practice Providers (APPs -  Physician Assistants and Nurse Practitioners) who all work together to provide you with the  care you need, when you need it.  We recommend signing up for the patient portal called "MyChart".  Sign up information is provided on this After Visit Summary.  MyChart is used to connect with patients for Virtual Visits (Telemedicine).  Patients are able to view lab/test results, encounter notes, upcoming appointments, etc.  Non-urgent messages can be sent to your provider as well.   To learn more about what you can do with MyChart, go to [email or web address].    Your next appointment:   We will schedule follow up after ablation  Provider:   York Pellant, MD

## 2023-05-14 NOTE — Progress Notes (Signed)
  Electrophysiology Office Note:    Date:  05/14/2023   ID:  Arnola, Nordby 1950-12-11, MRN [id number]  PCP:  Mirna Mires, MD   [facility] Providers Cardiologist:  Chrystie Nose, MD Electrophysiologist:  Maurice Small, MD     Referring MD: Mirna Mires, MD   History of Present Illness:    CALISHA SODARO is a 72 y.o. female with a hx listed below, significant for hypertension and atrial fibrillation, referred for arrhythmia management.  The patient was originally diagnosed with atrial fibrillation in 2017.  She had been doing well but was incidentally found to be in atrial fibrillation during an dentist visit in 04/17/22.  She was discharged in rate controlled atrial fibrillation but subsequently admitted with heart failure and atrial fibrillation.  She has since undergone DC cardioversions and Apr 18, 2023 and, after additional recurrences, in November and December 2023.    She has palpitations, fatigue, shortness of breath but no chest pain, syncope, pre-syncope.  She underwent A-fib ablation on January 29, 2023.  She has done well until recently.  She ran of her flecainide and missed a dose on Friday and then had recurrence later that afternoon.  She has remained in atrial fibrillation since.    EKGs/Labs/Other Studies Reviewed Today:     TTE: 17-Apr-2022 EF 60-65%. Moderately dilated LA  EKG:  Last EKG results: today - Atrial fibrillation   Recent Labs: 07/19/2022: ALT 14 11/05/2022: Magnesium 1.8 01/14/2023: BUN 13; Creatinine, Ser 0.83; Hemoglobin 14.9; Platelets 194; Potassium 4.4; Sodium 141     Physical Exam:    VS:  BP 132/70   Pulse 87   Ht 5\' 3"  (1.6 m)   Wt 217 lb 9.6 oz (98.7 kg)   SpO2 98%   BMI 38.55 kg/m     Wt Readings from Last 3 Encounters:  05/14/23 217 lb 9.6 oz (98.7 kg)  02/26/23 215 lb (97.5 kg)  01/29/23 213 lb (96.6 kg)     GEN: Well nourished, well developed in no acute distress CARDIAC: iRRR, no murmurs, rubs, gallops RESPIRATORY:   Normal work of breathing MUSCULOSKELETAL: no edema    ASSESSMENT & PLAN:    Atrial fibrillation, persistent: symptomatic, failed flecainide.  Status post atrial fibrillation ablation January 29, 2023; she required DC cardioversion during the ablation. AF recurred after missing flecainide last week Schedule DC cardioversion Continue flecainide Schedule repeat ablation --I will see her again prior to repeat ablation  Secondary hyercoagulable state: continue eliquis Obesity: I informed her that her risks of maintaining sinus rhythm will be significantly higher if she loses weight. I encouraged her to monitor her caloric intake and eat a higher proportion for vegetables and fruits and avoid fats and refined carbohydrates. Suspected sleep apnea: will order home sleep test.       Medication Adjustments/Labs and Tests Ordered: Current medicines are reviewed at length with the patient today.  Concerns regarding medicines are outlined above.  Orders Placed This Encounter  Procedures   EKG 12-Lead   No orders of the defined types were placed in this encounter.    Signed, Maurice Small, MD  05/14/2023 11:36 AM    [facility]

## 2023-05-16 ENCOUNTER — Telehealth: Payer: Self-pay

## 2023-05-16 DIAGNOSIS — I4819 Other persistent atrial fibrillation: Secondary | ICD-10-CM

## 2023-05-16 NOTE — Pre-Procedure Instructions (Signed)
Spoke to patient on phone regarding cardioversion tomorrow:  Instructed to arrive at 1230, NPO after midnight  Confirmed patient has a ride home and responsible person to stay with patient for 24 hours after the procedure  Confirmed no missed doses of eliquis, instructed patient to ensure she takes doses tonight and tomorrow  Take meds in the AM with a sip of water

## 2023-05-16 NOTE — Telephone Encounter (Signed)
Pt has been scheduled for an Afib Ablation with Dr. Nelly Laurence on 08/01/23 at 1:30 PM.  Lab appt - 8/21 at [facility]  Pt would like her Instruction letters mailed to her.

## 2023-05-17 ENCOUNTER — Ambulatory Visit (HOSPITAL_COMMUNITY): Payer: No Typology Code available for payment source | Admitting: Anesthesiology

## 2023-05-17 ENCOUNTER — Encounter (HOSPITAL_COMMUNITY)
Admission: RE | Disposition: A | Discharge status: Home / Self Care | Payer: Self-pay | Source: Home / Self Care | Attending: Internal Medicine

## 2023-05-17 ENCOUNTER — Ambulatory Visit (HOSPITAL_COMMUNITY)
Admission: RE | Admit: 2023-05-17 | Discharge: 2023-05-17 | Disposition: A | Discharge status: Home / Self Care | Payer: No Typology Code available for payment source | Attending: Internal Medicine | Admitting: Internal Medicine

## 2023-05-17 ENCOUNTER — Other Ambulatory Visit: Payer: Self-pay

## 2023-05-17 ENCOUNTER — Encounter (HOSPITAL_COMMUNITY): Payer: Self-pay | Admitting: Internal Medicine

## 2023-05-17 DIAGNOSIS — D649 Anemia, unspecified: Secondary | ICD-10-CM | POA: Diagnosis not present

## 2023-05-17 DIAGNOSIS — I4819 Other persistent atrial fibrillation: Secondary | ICD-10-CM | POA: Diagnosis not present

## 2023-05-17 DIAGNOSIS — Z79899 Other long term (current) drug therapy: Secondary | ICD-10-CM | POA: Insufficient documentation

## 2023-05-17 DIAGNOSIS — Z6838 Body mass index (BMI) 38.0-38.9, adult: Secondary | ICD-10-CM | POA: Diagnosis not present

## 2023-05-17 DIAGNOSIS — I509 Heart failure, unspecified: Secondary | ICD-10-CM | POA: Insufficient documentation

## 2023-05-17 DIAGNOSIS — Z7901 Long term (current) use of anticoagulants: Secondary | ICD-10-CM | POA: Insufficient documentation

## 2023-05-17 DIAGNOSIS — E669 Obesity, unspecified: Secondary | ICD-10-CM | POA: Diagnosis not present

## 2023-05-17 DIAGNOSIS — J45909 Unspecified asthma, uncomplicated: Secondary | ICD-10-CM

## 2023-05-17 DIAGNOSIS — I4891 Unspecified atrial fibrillation: Secondary | ICD-10-CM | POA: Diagnosis not present

## 2023-05-17 DIAGNOSIS — D6869 Other thrombophilia: Secondary | ICD-10-CM | POA: Insufficient documentation

## 2023-05-17 DIAGNOSIS — I110 Hypertensive heart disease with heart failure: Secondary | ICD-10-CM | POA: Diagnosis not present

## 2023-05-17 HISTORY — PX: CARDIOVERSION: SHX1299

## 2023-05-17 LAB — POCT I-STAT, CHEM 8
BUN: 12 mg/dL (ref 8–23)
BUN: 16 mg/dL (ref 8–23)
Calcium, Ion: 1.16 mmol/L (ref 1.15–1.40)
Calcium, Ion: 1.21 mmol/L (ref 1.15–1.40)
Chloride: 100 mmol/L (ref 98–111)
Chloride: 102 mmol/L (ref 98–111)
Creatinine, Ser: 0.8 mg/dL (ref 0.44–1.00)
Creatinine, Ser: 0.9 mg/dL (ref 0.44–1.00)
Glucose, Bld: 102 mg/dL — ABNORMAL HIGH (ref 70–99)
Glucose, Bld: 98 mg/dL (ref 70–99)
HCT: 40 % (ref 36.0–46.0)
HCT: 41 % (ref 36.0–46.0)
Hemoglobin: 13.6 g/dL (ref 12.0–15.0)
Hemoglobin: 13.9 g/dL (ref 12.0–15.0)
Potassium: 4.1 mmol/L (ref 3.5–5.1)
Potassium: 5.4 mmol/L — ABNORMAL HIGH (ref 3.5–5.1)
Sodium: 138 mmol/L (ref 135–145)
Sodium: 139 mmol/L (ref 135–145)
TCO2: 29 mmol/L (ref 22–32)
TCO2: 31 mmol/L (ref 22–32)

## 2023-05-17 SURGERY — CARDIOVERSION
Anesthesia: General

## 2023-05-17 MED ORDER — LIDOCAINE 2% (20 MG/ML) 5 ML SYRINGE
INTRAMUSCULAR | Status: DC | PRN
  Administered 2023-05-17: 60 mg via INTRAVENOUS

## 2023-05-17 MED ORDER — PROPOFOL 10 MG/ML IV BOLUS
INTRAVENOUS | Status: DC | PRN
  Administered 2023-05-17: 80 mg via INTRAVENOUS

## 2023-05-17 MED ORDER — SODIUM CHLORIDE 0.9 % IV SOLN: INTRAVENOUS | Status: DC

## 2023-05-17 MED ORDER — EPHEDRINE SULFATE-NACL 50-0.9 MG/10ML-% IV SOSY
PREFILLED_SYRINGE | INTRAVENOUS | Status: DC | PRN
  Administered 2023-05-17: 10 mg via INTRAVENOUS

## 2023-05-17 SURGICAL SUPPLY — 1 items: ELECT DEFIB PAD ADLT CADENCE (PAD) ×1 IMPLANT

## 2023-05-17 NOTE — Transfer of Care (Signed)
Immediate Anesthesia Transfer of Care Note  Patient: RUBBIE GOOSTREE  Procedure(s) Performed: CARDIOVERSION  Patient Location: PACU and Cath Lab  Anesthesia Type:General  Level of Consciousness: drowsy, patient cooperative, and responds to stimulation  Airway & Oxygen Therapy: Patient Spontanous Breathing and Patient connected to nasal cannula oxygen  Post-op Assessment: Report given to RN and Post -op Vital signs reviewed and stable  Post vital signs: Reviewed and stable  Last Vitals:  Vitals Value Taken Time  BP 122/108 05/17/23 1346  Temp    Pulse 54 05/17/23 1350  Resp 31 05/17/23 1350  SpO2 93 % 05/17/23 1350  Vitals shown include unvalidated device data.  Last Pain:  Vitals:   05/17/23 1320  PainSc: 0-No pain         Complications: No notable events documented.

## 2023-05-17 NOTE — Anesthesia Preprocedure Evaluation (Addendum)
Anesthesia Evaluation  Patient identified by MRN, date of birth, ID band Patient awake    Reviewed: Allergy & Precautions, NPO status , Patient's Chart, lab work & pertinent test results  History of Anesthesia Complications Negative for: history of anesthetic complications  Airway Mallampati: II  TM Distance: >3 FB Neck ROM: Full   Comment: Previous grade I view with MAC 3, easy mask Dental  (+) Dental Advisory Given   Pulmonary neg shortness of breath, asthma (no recent flares) , neg sleep apnea, neg COPD, neg recent URI   Pulmonary exam normal breath sounds clear to auscultation       Cardiovascular hypertension (metoprolol), Pt. on medications and Pt. on home beta blockers (-) angina +CHF  (-) Past MI, (-) Cardiac Stents and (-) CABG + dysrhythmias Atrial Fibrillation  Rhythm:Irregular Rate:Normal  TTE 03/01/2022: IMPRESSIONS     1. Left ventricular ejection fraction, by estimation, is 60 to 65%. Left  ventricular ejection fraction by 2D MOD biplane is 61.6 %. The left  ventricle has normal function. The left ventricle has no regional wall  motion abnormalities. There is mild left  ventricular hypertrophy. Left ventricular diastolic function could not be  evaluated.   2. Right ventricular systolic function is normal. The right ventricular  size is normal. There is mildly elevated pulmonary artery systolic  pressure. The estimated right ventricular systolic pressure is 36.3 mmHg.   3. Left atrial size was moderately dilated.   4. The mitral valve is grossly normal. Mild mitral valve regurgitation.   5. The aortic valve is tricuspid. Aortic valve regurgitation is trivial.  Aortic valve sclerosis is present, with no evidence of aortic valve  stenosis. Aortic regurgitation PHT measures 518 msec.   6. The inferior vena cava is dilated in size with >50% respiratory  variability, suggesting right atrial pressure of 8 mmHg.      Neuro/Psych negative neurological ROS     GI/Hepatic negative GI ROS, Neg liver ROS,,,  Endo/Other  negative endocrine ROS    Renal/GU negative Renal ROS     Musculoskeletal  (+) Arthritis ,    Abdominal  (+) + obese  Peds  Hematology  (+) Blood dyscrasia, anemia   Anesthesia Other Findings On Eliquis:  Reproductive/Obstetrics                             Anesthesia Physical Anesthesia Plan  ASA: 3  Anesthesia Plan: General   Post-op Pain Management: Minimal or no pain anticipated   Induction: Intravenous  PONV Risk Score and Plan: 3 and Treatment may vary due to age or medical condition  Airway Management Planned: Natural Airway and Nasal Cannula  Additional Equipment:   Intra-op Plan:   Post-operative Plan:   Informed Consent: I have reviewed the patients History and Physical, chart, labs and discussed the procedure including the risks, benefits and alternatives for the proposed anesthesia with the patient or authorized representative who has indicated his/her understanding and acceptance.     Dental advisory given  Plan Discussed with: CRNA and Anesthesiologist  Anesthesia Plan Comments: (Risks of general anesthesia discussed including, but not limited to, sore throat, hoarse voice, chipped/damaged teeth, injury to vocal cords, nausea and vomiting, allergic reactions, lung infection, heart attack, stroke, and death. All questions answered. )        Anesthesia Quick Evaluation

## 2023-05-17 NOTE — CV Procedure (Signed)
    Electrical Cardioversion Procedure Note [name] [name] [id number] 05/15/1951  Procedure: Electrical Cardioversion Indications:  Atrial Fibrillation  Time Out: Verified patient identification, verified procedure,medications/allergies/relevent history reviewed, required imaging and test results available.  Performed  Procedure Details  The patient was NPO after midnight. Anesthesia was administered at the beside  by Dr.Allan.  Cardioversion was done with synchronized biphasic defibrillation with AP pads with 200 Joules.  The patient converted to sinus rhythm The patient tolerated the procedure well.  IMPRESSION:  Successful cardioversion of atrial fibrillation   Riley Lam, MD FASE [facility] Cardiologist [facility]  [address] [place], [address] [place], [place] [address] [phone]  1:54 PM

## 2023-05-17 NOTE — Interval H&P Note (Signed)
History and Physical Interval Note:  05/17/2023 1:33 PM  LAJOY VANAMBURG  has presented today for surgery, with the diagnosis of afib.  The various methods of treatment have been discussed with the patient and family. After consideration of risks, benefits and other options for treatment, the patient has consented to  Procedure(s): CARDIOVERSION (N/A) as a surgical intervention.  The patient's history has been reviewed, patient examined, no change in status, stable for surgery.  I have reviewed the patient's chart and labs.  Questions were answered to the patient's satisfaction.     Annsleigh Dragoo A Florencia Zaccaro

## 2023-05-17 NOTE — Anesthesia Postprocedure Evaluation (Signed)
Anesthesia Post Note  Patient: [name] [name]  Procedure(s) Performed: CARDIOVERSION     Patient location during evaluation: PACU Anesthesia Type: General Level of consciousness: awake Pain management: pain level controlled Vital Signs Assessment: post-procedure vital signs reviewed and stable Respiratory status: spontaneous breathing, nonlabored ventilation and respiratory function stable Cardiovascular status: blood pressure returned to baseline and stable Postop Assessment: no apparent nausea or vomiting Anesthetic complications: no   No notable events documented.  Last Vitals:  Vitals:   05/17/23 1402 05/17/23 1410  BP: 98/73 106/71  Pulse:  (!) 56  Resp:  19  SpO2:  95%    Last Pain:  Vitals:   05/17/23 1402  PainSc: 0-No pain                 Linton Rump

## 2023-05-20 ENCOUNTER — Encounter (HOSPITAL_COMMUNITY): Payer: Self-pay | Admitting: Internal Medicine

## 2023-05-25 ENCOUNTER — Other Ambulatory Visit: Payer: Self-pay | Admitting: Student

## 2023-05-27 NOTE — Telephone Encounter (Signed)
Prescription refill request for Eliquis received. Indication:afib Last office visit:6/24 Scr:0.8  6/24 Age: 72 Weight:98.7  kg  Prescription refilled

## 2023-06-11 DIAGNOSIS — F4321 Adjustment disorder with depressed mood: Secondary | ICD-10-CM | POA: Diagnosis not present

## 2023-06-11 DIAGNOSIS — Z7901 Long term (current) use of anticoagulants: Secondary | ICD-10-CM | POA: Diagnosis not present

## 2023-06-11 DIAGNOSIS — I480 Paroxysmal atrial fibrillation: Secondary | ICD-10-CM | POA: Diagnosis not present

## 2023-06-11 DIAGNOSIS — Z6836 Body mass index (BMI) 36.0-36.9, adult: Secondary | ICD-10-CM | POA: Diagnosis not present

## 2023-06-11 DIAGNOSIS — R001 Bradycardia, unspecified: Secondary | ICD-10-CM | POA: Diagnosis not present

## 2023-06-11 DIAGNOSIS — I5032 Chronic diastolic (congestive) heart failure: Secondary | ICD-10-CM | POA: Diagnosis not present

## 2023-06-11 DIAGNOSIS — G4733 Obstructive sleep apnea (adult) (pediatric): Secondary | ICD-10-CM | POA: Diagnosis not present

## 2023-06-11 DIAGNOSIS — E7800 Pure hypercholesterolemia, unspecified: Secondary | ICD-10-CM | POA: Diagnosis not present

## 2023-06-11 DIAGNOSIS — I10 Essential (primary) hypertension: Secondary | ICD-10-CM | POA: Diagnosis not present

## 2023-07-04 ENCOUNTER — Telehealth: Payer: Self-pay | Admitting: *Deleted

## 2023-07-04 ENCOUNTER — Ambulatory Visit: Payer: No Typology Code available for payment source | Attending: Cardiology | Admitting: Cardiology

## 2023-07-04 ENCOUNTER — Encounter: Payer: Self-pay | Admitting: Cardiology

## 2023-07-04 VITALS — BP 138/78 | HR 50 | Ht 63.0 in | Wt 220.2 lb

## 2023-07-04 DIAGNOSIS — G4733 Obstructive sleep apnea (adult) (pediatric): Secondary | ICD-10-CM | POA: Diagnosis not present

## 2023-07-04 DIAGNOSIS — I10 Essential (primary) hypertension: Secondary | ICD-10-CM

## 2023-07-04 DIAGNOSIS — I5032 Chronic diastolic (congestive) heart failure: Secondary | ICD-10-CM

## 2023-07-04 NOTE — Patient Instructions (Signed)
Medication Instructions:  Your physician recommends that you continue on your current medications as directed. Please refer to the Current Medication list given to you today.  *If you need a refill on your cardiac medications before your next appointment, please call your pharmacy*   Lab Work: None.  If you have labs (blood work) drawn today and your tests are completely normal, you will receive your results only by: MyChart Message (if you have MyChart) OR A paper copy in the mail If you have any lab test that is abnormal or we need to change your treatment, we will call you to review the results.   Testing/Procedures: None.   Follow-Up: At  [facility]Care, you and your health needs are our priority.  As part of our continuing mission to provide you with exceptional heart care, we have created designated Provider Care Teams.  These Care Teams include your primary Cardiologist (physician) and Advanced Practice Providers (APPs -  Physician Assistants and Nurse Practitioners) who all work together to provide you with the care you need, when you need it.  We recommend signing up for the patient portal called "MyChart".  Sign up information is provided on this After Visit Summary.  MyChart is used to connect with patients for Virtual Visits (Telemedicine).  Patients are able to view lab/test results, encounter notes, upcoming appointments, etc.  Non-urgent messages can be sent to your provider as well.   To learn more about what you can do with MyChart, go to [email or web address].    Your next appointment:   1 year(s)  Provider:   Dr. Traci Turner, MD   

## 2023-07-04 NOTE — Progress Notes (Signed)
Sleep Medicine CONSULT Note    Date:  07/04/2023   ID:  Mckinnley, Ballinger [birth year], MRN [id number]  PCP:  Mirna Mires, MD  Cardiologist: Chrystie Nose, MD   Chief Complaint  Patient presents with   New Patient (Initial Visit)    Obstructive sleep apnea    History of Present Illness:  [name] [name] is a 72 y.o. female who is being seen today for the evaluation of OSA at the request of Gus Mealor, MD.  This is a 72 year old female with history of paroxysmal atrial fibrillation and hypertension.  Due to her obesity as well as paroxysmal atrial fibrillation there was concern that she could possibly have underlying obstructive sleep apnea driving her A-fib.  She says that prior to OSA, she would get sleepy during the day prior to retiring.  She was told that snores at night and would wake up snoring.  A home sleep study was ordered.  Home sleep study showed moderate obstructive sleep apnea with an AHI of 16.4/h and nocturnal hypoxemia.  She underwent CPAP titration to 5 cm H2O and is now referred for sleep medicine consultation to establish sleep care.  She is doing well with her PAP device and thinks that she has gotten used to it.  She tolerates the nasal pillow mask and feels the pressure is adequate.  Since going on PAP she feels she sleeps much better and does not wake up a lot at night.  She also feels more rested in the am and has no significant daytime sleepiness but still takes a nap some during the day if she gets up too early.  She denies any significant mouth or nasal dryness or nasal congestion.  She does not think that she snores.    Past Medical History:  Diagnosis Date   A-fib (HCC)    Anemia    during pneumonia   Arthritis    Asthma    Hypertension    OSA (obstructive sleep apnea)    moderate obstructive sleep apnea with an AHI of 16.4/h.  On CPAP at 5cm H2O    Past Surgical History:  Procedure Laterality Date   ATRIAL FIBRILLATION ABLATION N/A 01/29/2023    Procedure: ATRIAL FIBRILLATION ABLATION;  Surgeon: Maurice Small, MD;  [facility];  Service: Cardiovascular;  Laterality: N/A;   c section     1973 and 1983   CARDIOVERSION N/A 03/15/2022   Procedure: CARDIOVERSION;  Surgeon: [name], MD;  Location: [facility] ENDOSCOPY;  Service: Cardiovascular;  Laterality: N/A;  1340 shocked @120j , NSR   CARDIOVERSION N/A 11/22/2022   Procedure: CARDIOVERSION;  Surgeon: Chrystie Nose, MD;  Location: [facility] ENDOSCOPY;  Service: Cardiovascular;  Laterality: N/A;   CARDIOVERSION N/A 05/17/2023   Procedure: CARDIOVERSION;  Surgeon: Christell Constant, MD;  Location: [facility];  Service: Cardiovascular;  Laterality: N/A;   COLONOSCOPY     ORIF ANKLE FRACTURE Left 08/16/2016   ORIF ANKLE FRACTURE Left 08/16/2016   Procedure: OPEN REDUCTION INTERNAL FIXATION (ORIF) LEFT BIMALLEOLAR ANKLE FRACTURE;  Surgeon: Kathryne Hitch, MD;  Location: [facility] OR;  Service: Orthopedics;  Laterality: Left;   TUBAL LIGATION      Current Medications: Current Meds  Medication Sig   acetaminophen (TYLENOL) 500 MG tablet Take 1,000 mg by mouth every 6 (six) hours as needed for mild pain or headache.   apixaban (ELIQUIS) 5 MG TABS tablet Take 1 tablet by mouth twice daily  APPLE CIDER VINEGAR PO Take 450 mg by mouth every morning.   busPIRone (BUSPAR) 5 MG tablet Take 10 mg by mouth at bedtime.   Cholecalciferol (VITAMIN D) 50 MCG (2000 UT) tablet Take 2,000 Units by mouth daily.   diltiazem (CARDIZEM CD) 180 MG 24 hr capsule Take 1 capsule (180 mg total) by mouth daily.   flecainide (TAMBOCOR) 50 MG tablet Take 1 tablet (50 mg total) by mouth 2 (two) times daily.   fluticasone (FLONASE) 50 MCG/ACT nasal spray Place 2 sprays into both nostrils daily.   furosemide (LASIX) 20 MG tablet Take 1 tablet (20 mg total) by mouth daily as needed.   Magnesium 400 MG CAPS Take 400 mg by mouth every morning.   metoprolol tartrate (LOPRESSOR) 25 MG  tablet Take 1.5 tablets (37.5 mg total) by mouth 2 (two) times daily.   Multiple Vitamin (MULTIVITAMIN WITH MINERALS) TABS tablet Take 1 tablet by mouth daily.   Potassium 99 MG TABS Take 99 mg by mouth daily.    Allergies:   Patient has no known allergies.   Social History   Socioeconomic History   Marital status: Single    Spouse name: Not on file   Number of children: Not on file   Years of education: Not on file   Highest education level: Not on file  Occupational History   Not on file  Tobacco Use   Smoking status: Never   Smokeless tobacco: Never   Tobacco comments:    Never smoke 11/07/22  Vaping Use   Vaping status: Never Used  Substance and Sexual Activity   Alcohol use: No   Drug use: No   Sexual activity: Not Currently  Other Topics Concern   Not on file  Social History Narrative   Not on file   Social Determinants of Health   Financial Resource Strain: Not on file  Food Insecurity: Not on file  Transportation Needs: Not on file  Physical Activity: Not on file  Stress: Not on file  Social Connections: Not on file     Family History:  The patient's family history includes Asthma in her father; Atrial fibrillation in her mother; Heart attack in her father; Hypertension in her mother.   ROS:   Please see the history of present illness.    ROS All other systems reviewed and are negative.      No data to display             PHYSICAL EXAM:   VS:  BP 138/78   Pulse (!) 50   Ht 5\' 3"  (1.6 m)   Wt 220 lb 3.2 oz (99.9 kg)   SpO2 98%   BMI 39.01 kg/m    GEN: Well nourished, well developed, in no acute distress  HEENT: normal  Neck: no JVD, carotid bruits, or masses Cardiac: RRR; no murmurs, rubs, or gallops,no edema.  Intact distal pulses bilaterally.  Respiratory:  clear to auscultation bilaterally, normal work of breathing GI: soft, nontender, nondistended, + BS MS: no deformity or atrophy  Skin: warm and dry, no rash Neuro:  Alert and  Oriented x 3, Strength and sensation are intact Psych: euthymic mood, full affect  Wt Readings from Last 3 Encounters:  07/04/23 220 lb 3.2 oz (99.9 kg)  05/14/23 217 lb 9.6 oz (98.7 kg)  02/26/23 215 lb (97.5 kg)      Studies/Labs Reviewed:   Home sleep study, CPAP titration, PAP compliance download  Recent Labs: 07/19/2022: ALT 14 11/05/2022: Magnesium  1.8 01/14/2023: Platelets 194 05/17/2023: BUN 12; Creatinine, Ser 0.80; Hemoglobin 13.6; Potassium 4.1; Sodium 139    Additional studies/ records that were reviewed today include:  none    ASSESSMENT:    1. OSA (obstructive sleep apnea)   2. Essential hypertension      PLAN:  In order of problems listed above:  OSA - The patient is tolerating PAP therapy well without any problems. The PAP download performed by his DME was personally reviewed and interpreted by me today and showed an AHI of 0.4 /hr on 5 cm H2O with 100% compliance in using more than 4 hours nightly.  The patient has been using and benefiting from PAP use and will continue to benefit from therapy.   2.  Hypertension -BP controlled on exam today -Continue prescription drug management with Cardizem CD 180 mg daily, Lopressor 37.5 mg twice daily with as needed refills  Followup with me in 1 year   Time Spent: 20 minutes total time of encounter, including 15 minutes spent in face-to-face patient care on the date of this encounter. This time includes coordination of care and counseling regarding above mentioned problem list. Remainder of non-face-to-face time involved reviewing chart documents/testing relevant to the patient encounter and documentation in the medical record. I have independently reviewed documentation from referring provider  Medication Adjustments/Labs and Tests Ordered: Current medicines are reviewed at length with the patient today.  Concerns regarding medicines are outlined above.  Medication changes, Labs and Tests ordered today are listed  in the Patient Instructions below.  There are no Patient Instructions on file for this visit.   Signed, Armanda Magic, MD  07/04/2023 9:39 AM    [facility] [address] [place], [place], [place]  [address] Phone: [phone]; Fax: [phone]

## 2023-07-04 NOTE — Telephone Encounter (Signed)
Order placed to [facility] [employer] via community message 

## 2023-07-04 NOTE — Telephone Encounter (Signed)
-----   Message from Nurse Alcario Drought E sent at 07/04/2023  9:46 AM EDT ----- Regarding: cpap supplies Dr. Mayford Knife would like to order cpap supplies for this patient. Alcario Drought

## 2023-07-12 DIAGNOSIS — I5032 Chronic diastolic (congestive) heart failure: Secondary | ICD-10-CM | POA: Diagnosis not present

## 2023-07-12 DIAGNOSIS — G4733 Obstructive sleep apnea (adult) (pediatric): Secondary | ICD-10-CM | POA: Diagnosis not present

## 2023-07-12 DIAGNOSIS — I10 Essential (primary) hypertension: Secondary | ICD-10-CM | POA: Diagnosis not present

## 2023-07-17 ENCOUNTER — Ambulatory Visit: Payer: No Typology Code available for payment source | Attending: Cardiovascular Disease

## 2023-07-17 DIAGNOSIS — I4819 Other persistent atrial fibrillation: Secondary | ICD-10-CM

## 2023-07-18 LAB — BASIC METABOLIC PANEL
BUN/Creatinine Ratio: 14 (ref 12–28)
BUN: 12 mg/dL (ref 8–27)
CO2: 23 mmol/L (ref 20–29)
Calcium: 9 mg/dL (ref 8.7–10.3)
Chloride: 103 mmol/L (ref 96–106)
Creatinine, Ser: 0.86 mg/dL (ref 0.57–1.00)
Glucose: 102 mg/dL — ABNORMAL HIGH (ref 70–99)
Potassium: 4.2 mmol/L (ref 3.5–5.2)
Sodium: 140 mmol/L (ref 134–144)
eGFR: 72 mL/min/{1.73_m2} (ref >59)

## 2023-07-18 LAB — CBC
Hematocrit: 41.5 % (ref 34.0–46.6)
Hemoglobin: 13.8 g/dL (ref 11.1–15.9)
MCH: 32.5 pg (ref 26.6–33.0)
MCHC: 33.3 g/dL (ref 31.5–35.7)
MCV: 98 fL — ABNORMAL HIGH (ref 79–97)
Platelets: 108 10*3/uL — ABNORMAL LOW (ref 150–450)
RBC: 4.24 x10E6/uL (ref 3.77–5.28)
RDW: 12.2 % (ref 11.7–15.4)
WBC: 5.4 10*3/uL (ref 3.4–10.8)

## 2023-07-22 ENCOUNTER — Ambulatory Visit: Payer: No Typology Code available for payment source | Attending: Internal Medicine | Admitting: Internal Medicine

## 2023-07-22 VITALS — BP 152/80 | HR 49 | Ht 63.0 in | Wt 216.2 lb

## 2023-07-22 DIAGNOSIS — I480 Paroxysmal atrial fibrillation: Secondary | ICD-10-CM

## 2023-07-22 DIAGNOSIS — D6869 Other thrombophilia: Secondary | ICD-10-CM | POA: Diagnosis not present

## 2023-07-22 DIAGNOSIS — I5032 Chronic diastolic (congestive) heart failure: Secondary | ICD-10-CM | POA: Diagnosis not present

## 2023-07-22 DIAGNOSIS — I10 Essential (primary) hypertension: Secondary | ICD-10-CM | POA: Diagnosis not present

## 2023-07-22 DIAGNOSIS — I4819 Other persistent atrial fibrillation: Secondary | ICD-10-CM | POA: Diagnosis not present

## 2023-07-22 NOTE — Progress Notes (Signed)
OFFICE NOTE  Chief Complaint:  Routine follow-up  Primary Care Physician: Mirna Mires, MD  HPI:  [name] [name] is a 72 y.o. female with a past medical history significant for hypertension and asthma. She presented with onset of aching in her left shoulder and heart racing. She took an aspirin without any improvement and called EMS. On arrival she was found to be in A. fib with rapid ventricular response at 180 bpm. She was given IV diltiazem and spontaneously converted to sinus rhythm however had a short recurrence of A. fib in the ER and then again converted back to sinus rhythm. This patients CHA2DS2-VASc Score and unadjusted Ischemic Stroke Rate (% per year) is equal to 2.2 % stroke rate/year from a score of 2. Above score calculated as 1 point each if present [CHF, HTN, DM, Vascular=MI/PAD/Aortic Plaque, Age if 65-74, or Female] Above score calculated as 2 points each if present [Age > 75, or Stroke/TIA/TE]  [name] [name] returns today for follow-up. She underwent an exercise treadmill stress test which was negative for ischemia. Although the report indicated she did not reach target, I personally reviewed it and indicated her heart rate was greater than 85% max predicted heart rate. Therefore this was an adequate study and low risk. Her echocardiogram demonstrates normal LV function with an EF of 60-65%, there is trivial aortic insufficiency and it was read as grade 2 diastolic dysfunction. Is not clear whether she may eventually been in A. fib or not had recovery of atrial mechanical activity at the time therefore I question the degree of diastolic dysfunction. She denies any chest pain, or shortness of breath with exertion. Blood pressure is fairly well-controlled today. She is tolerating diltiazem which was a switch from verapamil which she was previously taking. She is also on Eliquis 5 mg twice a day without any bleeding problems. Heart rate is in the 60s today and regular.  08/31/2016  [name] [name]  returns today for follow-up. Unfortunately she recently broke her left lower leg and underwent surgery to repair that. She is in a walking boot. She started some physical therapy for that. Fortunately she has not had any recurrent atrial fibrillation. She did discontinue hear few days prior to surgery and restart it afterwards. Heart rate and bloo pressure are well controlled.  09/24/2017  [name] [name] was seen today in follow-up.  Overall she seems to be doing well.  About a year ago she had an ankle fracture and is still wearing a support brace.  She says she is not as active as she was.  She denies any recurrent A. fib.  Blood pressure appears to be well controlled.  She continues on Eliquis without any significant bleeding problems.  09/12/2018  [name] [name] is seen today in follow-up.  Over the past year she is done very well.  She had one episode last December of tachycardia and palpitation when she was upset at work.  Otherwise she has had no further A. fib.  She is not exercising much and is given a push mowing her lawn due to her ankle fracture that she had.  She said no bleeding issues on Eliquis.  Blood pressure has been well controlled at home.  09/14/2019  [name] [name] returns today for follow-up.  Overall she is doing well.  She denies any chest pain or worsening shortness of breath.  She reports possibly 1 short episode of atrial fibrillation however is noted to be in sinus rhythm with PACs today.  She denies any bleeding complications  on Eliquis.  She takes diltiazem for rate control and her blood pressure was top normal today 140/87.  09/06/2020  [name] [name] is seen today for follow-up.  Overall she seems to be doing well.  She may have had one spell of atrial fibrillation which was short-lived.  In general she has good blood pressure control.  EKG today shows normal sinus rhythm.  07/22/2023  [name] [name] returns today for follow-up.  She has been feeling well except she has been having more recent atrial  fibrillation.  She underwent A-fib ablation in March 2024.  She had to be cardioverted during that ablation.  She was started on flecainide but her AF recurred after missing some doses.  She has remained on flecainide.  She is in a sinus bradycardia today.  There is plan for repeat ablation which is scheduled on September 5 with Dr. Nelly Laurence.  PMHx:  Past Medical History:  Diagnosis Date   A-fib (HCC)    Anemia    during pneumonia   Arthritis    Asthma    Hypertension    OSA (obstructive sleep apnea)    moderate obstructive sleep apnea with an AHI of 16.4/h.  On CPAP at 5cm H2O    Past Surgical History:  Procedure Laterality Date   ATRIAL FIBRILLATION ABLATION N/A 01/29/2023   Procedure: ATRIAL FIBRILLATION ABLATION;  Surgeon: Maurice Small, MD;  [facility]: [facility] IN[facility] LAB;  Service: Cardiovascular;  Laterality: N/A;   c section     1973 and 1983   CARDIOVERSION N/A 03/15/2022   Procedure: CARDIOVERSION;  Surgeon: [name], MD;  Location: [facility] ENDOSCOPY;  Service: Cardiovascular;  Laterality: N/A;  1340 shocked @120j , NSR   CARDIOVERSION N/A 11/22/2022   Procedure: CARDIOVERSION;  Surgeon: Chrystie Nose, MD;  Location: [facility] ENDOSCOPY;  Service: Cardiovascular;  Laterality: N/A;   CARDIOVERSION N/A 05/17/2023   Procedure: CARDIOVERSION;  Surgeon: Christell Constant, MD;  [facility]: [facility] INVASIVE CV LAB;  Service: Cardiovascular;  Laterality: N/A;   COLONOSCOPY     ORIF ANKLE FRACTURE Left 08/16/2016   ORIF ANKLE FRACTURE Left 08/16/2016   Procedure: OPEN REDUCTION INTERNAL FIXATION (ORIF) LEFT BIMALLEOLAR ANKLE FRACTURE;  Surgeon: Kathryne Hitch, MD;  Location: [facility] OR;  Service: Orthopedics;  Laterality: Left;   TUBAL LIGATION      FAMHx:  Family History  Problem Relation Age of Onset   Atrial fibrillation Mother    Hypertension Mother    Asthma Father    Heart attack Father     SOCHx:   reports that she has never smoked. She has never used smokeless  tobacco. She reports that she does not drink alcohol and does not use drugs.  ALLERGIES:  No Known Allergies  ROS: Pertinent items noted in HPI and remainder of comprehensive ROS otherwise negative.  HOME MEDS: Current Outpatient Medications  Medication Sig Dispense Refill   acetaminophen (TYLENOL) 500 MG tablet Take 1,000 mg by mouth every 6 (six) hours as needed for mild pain or headache.     apixaban (ELIQUIS) 5 MG TABS tablet Take 1 tablet by mouth twice daily 60 tablet 5   APPLE CIDER VINEGAR PO Take 450 mg by mouth every morning.     busPIRone (BUSPAR) 5 MG tablet Take 10 mg by mouth at bedtime.     Cholecalciferol (VITAMIN D) 50 MCG (2000 UT) tablet Take 2,000 Units by mouth daily.     diltiazem (CARDIZEM CD) 180 MG 24 hr capsule Take 1 capsule (180 mg total)  by mouth daily. 90 capsule 2   flecainide (TAMBOCOR) 50 MG tablet Take 1 tablet (50 mg total) by mouth 2 (two) times daily. 180 tablet 3   fluticasone (FLONASE) 50 MCG/ACT nasal spray Place 2 sprays into both nostrils daily.     furosemide (LASIX) 20 MG tablet Take 1 tablet (20 mg total) by mouth daily as needed. 30 tablet 6   Magnesium 400 MG CAPS Take 400 mg by mouth every morning.     metoprolol tartrate (LOPRESSOR) 25 MG tablet Take 1.5 tablets (37.5 mg total) by mouth 2 (two) times daily. 90 tablet 6   Multiple Vitamin (MULTIVITAMIN WITH MINERALS) TABS tablet Take 1 tablet by mouth daily.     Potassium 99 MG TABS Take 99 mg by mouth daily.     pantoprazole (PROTONIX) 40 MG tablet Take 1 tablet (40 mg total) by mouth daily. (Patient not taking: Reported on 05/14/2023) 45 tablet 0   No current facility-administered medications for this visit.    LABS/IMAGING: No results found for this or any previous visit (from the past 48 hour(s)). No results found.  WEIGHTS: Wt Readings from Last 3 Encounters:  07/22/23 216 lb 3.2 oz (98.1 kg)  07/04/23 220 lb 3.2 oz (99.9 kg)  05/14/23 217 lb 9.6 oz (98.7 kg)    VITALS: BP  (!) 152/80 (BP Location: Left Arm, Patient Position: Sitting, Cuff Size: Normal)   Pulse (!) 49   Ht 5\' 3"  (1.6 m)   Wt 216 lb 3.2 oz (98.1 kg)   SpO2 97%   BMI 38.30 kg/m   EXAM: General appearance: alert and no distress Neck: no carotid bruit and no JVD Lungs: clear to auscultation bilaterally Heart: regular rate and rhythm, S1, S2 normal, no murmur, click, rub or gallop Abdomen: soft, non-tender; bowel sounds normal; no masses,  no organomegaly Extremities: left lower extremity in ankle brace Pulses: 2+ and symmetric Skin: Skin color, texture, turgor normal. No rashes or lesions Neurologic: Grossly normal Psych: Pleasant  EKG: EKG Interpretation Date/Time:  Monday July 22 2023 09:10:12 EDT Ventricular Rate:  46 PR Interval:  236 QRS Duration:  90 QT Interval:  492 QTC Calculation: 430 R Axis:   97  Text Interpretation: Sinus bradycardia with 1st degree A-V block Rightward axis Low voltage QRS Cannot rule out Anterior infarct (cited on or before 17-May-2023) When compared with ECG of 17-May-2023 13:52, No significant change was found Confirmed by Zoila Shutter [phone]) on 07/22/2023 9:24:59 AM    ASSESSMENT: PAF-CHADSVASC score of 2 on Eliquis Post A-fib ablation (01/29/2023), with recurrence, now scheduled for repeat ablation on 08/01/2023 Hypertension-controlled Long-term use of anticoagulants  PLAN: 1.   Mrs. Dood continues to struggle with A-fib although she has been in sinus rhythm on flecainide recently.  She is scheduled for repeat ablation with Dr. Nelly Laurence next week.  She saw Dr. Mayford Knife earlier this month for obstructive sleep apnea and reports good sleep and energy level and compliance with her CPAP.  No changes to her meds today.  I did repeat her blood pressure which came down slightly to 140/78.  Follow-up with me in 6 months or sooner as necessary.  Chrystie Nose, MD, [facility], FACP  [place]  [facility]  Medical Director of the Advanced Lipid  Disorders &  Cardiovascular Risk Reduction Clinic Diplomate of the American Board of Clinical Lipidology Attending Cardiologist  Direct Dial: [phone]  Fax: [phone]  Website:  [email or web address]   Lisette Abu Bensen [place] 07/22/2023, 9:25 AM

## 2023-07-22 NOTE — Patient Instructions (Signed)
Medication Instructions:  Your physician recommends that you continue on your current medications as directed. Please refer to the Current Medication list given to you today.  *If you need a refill on your cardiac medications before your next appointment, please call your pharmacy*   Lab Work: NONE   If you have labs (blood work) drawn today and your tests are completely normal, you will receive your results only by: MyChart Message (if you have MyChart) OR A paper copy in the mail If you have any lab test that is abnormal or we need to change your treatment, we will call you to review the results.   Testing/Procedures: NONE    Follow-Up: At [facility], you and your health needs are our priority.  As part of our continuing mission to provide you with exceptional heart care, we have created designated Provider Care Teams.  These Care Teams include your primary Cardiologist (physician) and Advanced Practice Providers (APPs -  Physician Assistants and Nurse Practitioners) who all work together to provide you with the care you need, when you need it.  We recommend signing up for the patient portal called "MyChart".  Sign up information is provided on this After Visit Summary.  MyChart is used to connect with patients for Virtual Visits (Telemedicine).  Patients are able to view lab/test results, encounter notes, upcoming appointments, etc.  Non-urgent messages can be sent to your provider as well.   To learn more about what you can do with MyChart, go to [email or web address].    Your next appointment:   6 month(s)  Provider:   Chrystie Nose, MD     Other Instructions Thank you for choosing [facility]!

## 2023-07-24 ENCOUNTER — Telehealth (HOSPITAL_COMMUNITY): Payer: Self-pay | Admitting: Emergency Medicine

## 2023-07-24 NOTE — Telephone Encounter (Signed)
Reaching out to patient to offer assistance regarding upcoming cardiac imaging study; pt verbalizes understanding of appt date/time, parking situation and where to check in, pre-test NPO status and medications ordered, and verified current allergies; name and call back number provided for further questions should they arise Sara Wallace RN Navigator Cardiac Imaging [facility] [phone] office [phone] cell 

## 2023-07-25 ENCOUNTER — Ambulatory Visit (HOSPITAL_COMMUNITY)
Admission: RE | Admit: 2023-07-25 | Discharge: 2023-07-25 | Disposition: A | Discharge status: Home / Self Care | Payer: No Typology Code available for payment source | Source: Ambulatory Visit | Attending: Cardiovascular Disease | Admitting: Cardiovascular Disease

## 2023-07-25 DIAGNOSIS — I4819 Other persistent atrial fibrillation: Secondary | ICD-10-CM | POA: Diagnosis not present

## 2023-07-25 MED ORDER — IOHEXOL 350 MG/ML SOLN
100.0000 mL | Freq: Once | INTRAVENOUS | Status: AC | PRN
  Administered 2023-07-25: 100 mL via INTRAVENOUS

## 2023-07-31 NOTE — Pre-Procedure Instructions (Signed)
Attempted to call patient regarding procedure instructions for tomorrow.  Left voicemail on the following items: Arrival time 1130 Nothing to eat or drink after midnight No meds AM of procedure Responsible person to drive you home and stay with you for 24 hrs  Have you missed any doses of anti-coagulant Eliquis-  should be taken twice a day, hasn't missed any doses.  Don't take in the morning.

## 2023-08-01 ENCOUNTER — Ambulatory Visit (HOSPITAL_COMMUNITY): Payer: No Typology Code available for payment source | Admitting: Anesthesiology

## 2023-08-01 ENCOUNTER — Ambulatory Visit (HOSPITAL_BASED_OUTPATIENT_CLINIC_OR_DEPARTMENT_OTHER): Payer: No Typology Code available for payment source | Admitting: Anesthesiology

## 2023-08-01 ENCOUNTER — Ambulatory Visit (HOSPITAL_COMMUNITY)
Admission: RE | Disposition: A | Discharge status: Home / Self Care | Payer: Self-pay | Source: Home / Self Care | Attending: Cardiovascular Disease

## 2023-08-01 ENCOUNTER — Ambulatory Visit (HOSPITAL_COMMUNITY)
Admission: RE | Admit: 2023-08-01 | Discharge: 2023-08-01 | Disposition: A | Discharge status: Home / Self Care | Payer: No Typology Code available for payment source | Attending: Cardiovascular Disease | Admitting: Cardiovascular Disease

## 2023-08-01 DIAGNOSIS — I4819 Other persistent atrial fibrillation: Secondary | ICD-10-CM | POA: Diagnosis not present

## 2023-08-01 DIAGNOSIS — D6869 Other thrombophilia: Secondary | ICD-10-CM | POA: Insufficient documentation

## 2023-08-01 DIAGNOSIS — Z6838 Body mass index (BMI) 38.0-38.9, adult: Secondary | ICD-10-CM | POA: Diagnosis not present

## 2023-08-01 DIAGNOSIS — I5032 Chronic diastolic (congestive) heart failure: Secondary | ICD-10-CM | POA: Diagnosis not present

## 2023-08-01 DIAGNOSIS — G4733 Obstructive sleep apnea (adult) (pediatric): Secondary | ICD-10-CM

## 2023-08-01 DIAGNOSIS — Z7901 Long term (current) use of anticoagulants: Secondary | ICD-10-CM | POA: Insufficient documentation

## 2023-08-01 DIAGNOSIS — G4730 Sleep apnea, unspecified: Secondary | ICD-10-CM | POA: Diagnosis not present

## 2023-08-01 DIAGNOSIS — I132 Hypertensive heart and chronic kidney disease with heart failure and with stage 5 chronic kidney disease, or end stage renal disease: Secondary | ICD-10-CM | POA: Diagnosis not present

## 2023-08-01 DIAGNOSIS — I4891 Unspecified atrial fibrillation: Secondary | ICD-10-CM | POA: Diagnosis not present

## 2023-08-01 DIAGNOSIS — I10 Essential (primary) hypertension: Secondary | ICD-10-CM | POA: Insufficient documentation

## 2023-08-01 DIAGNOSIS — E669 Obesity, unspecified: Secondary | ICD-10-CM | POA: Diagnosis not present

## 2023-08-01 HISTORY — PX: ATRIAL FIBRILLATION ABLATION: EP1191

## 2023-08-01 LAB — POCT ACTIVATED CLOTTING TIME: Activated Clotting Time: 366 s

## 2023-08-01 SURGERY — ATRIAL FIBRILLATION ABLATION
Anesthesia: General

## 2023-08-01 MED ORDER — ROCURONIUM BROMIDE 10 MG/ML (PF) SYRINGE
PREFILLED_SYRINGE | INTRAVENOUS | Status: DC | PRN
  Administered 2023-08-01: 20 mg via INTRAVENOUS
  Administered 2023-08-01: 60 mg via INTRAVENOUS
  Administered 2023-08-01: 10 mg via INTRAVENOUS

## 2023-08-01 MED ORDER — SUGAMMADEX SODIUM 200 MG/2ML IV SOLN
INTRAVENOUS | Status: DC | PRN
  Administered 2023-08-01: 400 mg via INTRAVENOUS

## 2023-08-01 MED ORDER — ONDANSETRON HCL 4 MG/2ML IJ SOLN: 4.0000 mg | Freq: Four times a day (QID) | INTRAMUSCULAR | Status: DC | PRN

## 2023-08-01 MED ORDER — ATROPINE SULFATE 1 MG/ML IV SOLN
INTRAVENOUS | Status: DC | PRN
  Administered 2023-08-01: 1 mg via INTRAVENOUS

## 2023-08-01 MED ORDER — FENTANYL CITRATE (PF) 100 MCG/2ML IJ SOLN
INTRAMUSCULAR | Status: AC
  Filled 2023-08-01: qty 2

## 2023-08-01 MED ORDER — PHENYLEPHRINE 80 MCG/ML (10ML) SYRINGE FOR IV PUSH (FOR BLOOD PRESSURE SUPPORT)
PREFILLED_SYRINGE | INTRAVENOUS | Status: DC | PRN
  Administered 2023-08-01: 80 ug via INTRAVENOUS

## 2023-08-01 MED ORDER — SODIUM CHLORIDE 0.9% FLUSH: 3.0000 mL | INTRAVENOUS | Status: DC | PRN

## 2023-08-01 MED ORDER — ACETAMINOPHEN 325 MG PO TABS
650.0000 mg | ORAL_TABLET | ORAL | Status: DC | PRN
  Administered 2023-08-01: 650 mg via ORAL
  Filled 2023-08-01: qty 2

## 2023-08-01 MED ORDER — SODIUM CHLORIDE 0.9 % IV SOLN: 250.0000 mL | INTRAVENOUS | Status: DC | PRN

## 2023-08-01 MED ORDER — SODIUM CHLORIDE 0.9 % IV SOLN: INTRAVENOUS | Status: DC

## 2023-08-01 MED ORDER — PROTAMINE SULFATE 10 MG/ML IV SOLN
INTRAVENOUS | Status: DC | PRN
  Administered 2023-08-01: 35 mg via INTRAVENOUS

## 2023-08-01 MED ORDER — LIDOCAINE 2% (20 MG/ML) 5 ML SYRINGE
INTRAMUSCULAR | Status: DC | PRN
  Administered 2023-08-01: 60 mg via INTRAVENOUS

## 2023-08-01 MED ORDER — HEPARIN (PORCINE) IN NACL 1000-0.9 UT/500ML-% IV SOLN
INTRAVENOUS | Status: DC | PRN
  Administered 2023-08-01 (×4): 500 mL

## 2023-08-01 MED ORDER — PHENYLEPHRINE HCL-NACL 20-0.9 MG/250ML-% IV SOLN
INTRAVENOUS | Status: DC | PRN
  Administered 2023-08-01: 10 ug/min via INTRAVENOUS

## 2023-08-01 MED ORDER — ONDANSETRON HCL 4 MG/2ML IJ SOLN
INTRAMUSCULAR | Status: DC | PRN
  Administered 2023-08-01: 4 mg via INTRAVENOUS

## 2023-08-01 MED ORDER — DEXAMETHASONE SODIUM PHOSPHATE 10 MG/ML IJ SOLN
INTRAMUSCULAR | Status: DC | PRN
  Administered 2023-08-01: 10 mg via INTRAVENOUS

## 2023-08-01 MED ORDER — PROPOFOL 10 MG/ML IV BOLUS
INTRAVENOUS | Status: DC | PRN
  Administered 2023-08-01: 150 mg via INTRAVENOUS

## 2023-08-01 MED ORDER — FENTANYL CITRATE (PF) 100 MCG/2ML IJ SOLN
INTRAMUSCULAR | Status: DC | PRN
  Administered 2023-08-01: 100 ug via INTRAVENOUS

## 2023-08-01 MED ORDER — HEPARIN SODIUM (PORCINE) 1000 UNIT/ML IJ SOLN
INTRAMUSCULAR | Status: DC | PRN
  Administered 2023-08-01: 16000 [IU] via INTRAVENOUS

## 2023-08-01 SURGICAL SUPPLY — 22 items
BAG SNAP BAND KOVER 36X36 (MISCELLANEOUS) IMPLANT
CABLE PFA RX CATH CONN (CABLE) IMPLANT
CATH 8FR REPROCESSED SOUNDSTAR (CATHETERS) ×1 IMPLANT
CATH 8FR SOUNDSTAR REPROCESSED (CATHETERS) IMPLANT
CATH FARAWAVE ABLATION 31 (CATHETERS) IMPLANT
CATH OCTARAY 2.0 F 3-3-3-3-3 (CATHETERS) IMPLANT
CATH WEBSTER BI DIR CS D-F CRV (CATHETERS) IMPLANT
CLOSURE PERCLOSE PROSTYLE (VASCULAR PRODUCTS) IMPLANT
COVER SWIFTLINK CONNECTOR (BAG) ×1 IMPLANT
DEVICE CLOSURE MYNXGRIP 6/7F (Vascular Products) IMPLANT
DILATOR VESSEL 38 20CM 16FR (INTRODUCER) IMPLANT
GUIDEWIRE INQWIRE 1.5J.035X260 (WIRE) IMPLANT
INQWIRE 1.5J .035X260CM (WIRE) ×1
PACK EP LATEX FREE (CUSTOM PROCEDURE TRAY) ×1
PACK EP LF (CUSTOM PROCEDURE TRAY) ×1 IMPLANT
PAD DEFIB RADIO PHYSIO CONN (PAD) ×1 IMPLANT
PATCH CARTO3 (PAD) IMPLANT
SHEATH FARADRIVE STEERABLE (SHEATH) IMPLANT
SHEATH PINNACLE 8F 10CM (SHEATH) IMPLANT
SHEATH PINNACLE 9F 10CM (SHEATH) IMPLANT
SHEATH PROBE COVER 6X72 (BAG) IMPLANT
SHEATH WIRE KIT BAYLIS SL1 (KITS) IMPLANT

## 2023-08-01 NOTE — Progress Notes (Signed)
Patient walked to the bathroom without difficulties, bilateral groin sites, level 0, clean, dry, and intact.

## 2023-08-01 NOTE — Discharge Instructions (Addendum)
Continue taking Diltiazem 180 mg and Flecainide 50mg        Cardiac Ablation, Care After  This sheet gives you information about how to care for yourself after your procedure. Your health care provider may also give you more specific instructions. If you have problems or questions, contact your health care provider. What can I expect after the procedure? After the procedure, it is common to have: Bruising around your puncture site. Tenderness around your puncture site. Skipped heartbeats. If you had an atrial fibrillation ablation, you may have atrial fibrillation during the first several months after your procedure.  Tiredness (fatigue).  Follow these instructions at home: Puncture site care  Follow instructions from your health care provider about how to take care of your puncture site. Make sure you: If present, leave stitches (sutures), skin glue, or adhesive strips in place. These skin closures may need to stay in place for up to 2 weeks. If adhesive strip edges start to loosen and curl up, you may trim the loose edges. Do not remove adhesive strips completely unless your health care provider tells you to do that. If a large square bandage is present, this may be removed 24 hours after surgery.  Check your puncture site every day for signs of infection. Check for: Redness, swelling, or pain. Fluid or blood. If your puncture site starts to bleed, lie down on your back, apply firm pressure to the area, and contact your health care provider. Warmth. Pus or a bad smell. A pea or small marble sized lump at the site is normal and can take up to three months to resolve.  Driving Do not drive for at least 4 days after your procedure or however long your health care provider recommends. (Do not resume driving if you have previously been instructed not to drive for other health reasons.) Do not drive or use heavy machinery while taking prescription pain medicine. Activity Avoid activities  that take a lot of effort for at least 7 days after your procedure. Do not lift anything that is heavier than 5 lb (4.5 kg) for one week.  No sexual activity for 1 week.  Return to your normal activities as told by your health care provider. Ask your health care provider what activities are safe for you. General instructions Take over-the-counter and prescription medicines only as told by your health care provider. Do not use any products that contain nicotine or tobacco, such as cigarettes and e-cigarettes. If you need help quitting, ask your health care provider. You may shower after 24 hours, but Do not take baths, swim, or use a hot tub for 1 week.  Do not drink alcohol for 24 hours after your procedure. Keep all follow-up visits as told by your health care provider. This is important. Contact a health care provider if: You have redness, mild swelling, or pain around your puncture site. You have fluid or blood coming from your puncture site that stops after applying firm pressure to the area. Your puncture site feels warm to the touch. You have pus or a bad smell coming from your puncture site. You have a fever. You have chest pain or discomfort that spreads to your neck, jaw, or arm. You have chest pain that is worse with lying on your back or taking a deep breath. You are sweating a lot. You feel nauseous. You have a fast or irregular heartbeat. You have shortness of breath. You are dizzy or light-headed and feel the need to lie down.  You have pain or numbness in the arm or leg closest to your puncture site. Get help right away if: Your puncture site suddenly swells. Your puncture site is bleeding and the bleeding does not stop after applying firm pressure to the area. These symptoms may represent a serious problem that is an emergency. Do not wait to see if the symptoms will go away. Get medical help right away. Call your local emergency services (911 in the [place] Do not drive  yourself to the hospital. Summary After the procedure, it is normal to have bruising and tenderness at the puncture site in your groin, neck, or forearm. Check your puncture site every day for signs of infection. Get help right away if your puncture site is bleeding and the bleeding does not stop after applying firm pressure to the area. This is a medical emergency. This information is not intended to replace advice given to you by your health care provider. Make sure you discuss any questions you have with your health care provider.

## 2023-08-01 NOTE — Progress Notes (Addendum)
Spoke with Dr Nelly Laurence by phone, informed of increased HR into 140's and 12 lead EKG's results, pics sent, orders given to continue flecainide and diltiazem post discharge, Eliquis to continue, see home meds, safety maintained

## 2023-08-01 NOTE — Progress Notes (Signed)
Discharge instructions given. Continue flecainide and diltiazem typed onto instructions.

## 2023-08-01 NOTE — Anesthesia Procedure Notes (Signed)
Procedure Name: Intubation Date/Time: 08/01/2023 2:20 PM  Performed by: Camillia Herter, CRNAPre-anesthesia Checklist: Patient identified, Emergency Drugs available, Suction available, Patient being monitored and Timeout performed Patient Re-evaluated:Patient Re-evaluated prior to induction Oxygen Delivery Method: Circle system utilized Preoxygenation: Pre-oxygenation with 100% oxygen Induction Type: IV induction and Rapid sequence Ventilation: Mask ventilation without difficulty and Oral airway inserted - appropriate to patient size Laryngoscope Size: Glidescope and 3 Grade View: Grade I Tube size: 7.0 mm Number of attempts: 2 Airway Equipment and Method: Stylet and Video-laryngoscopy Placement Confirmation: ETT inserted through vocal cords under direct vision, breath sounds checked- equal and bilateral and CO2 detector Tube secured with: Tape Dental Injury: Teeth and Oropharynx as per pre-operative assessment  Difficulty Due To: Difficult Airway- due to large tongue Comments: 1st DVL attempt by MDA unsuccessful,BMV with OPA. Successful intubation with Glidescope.

## 2023-08-01 NOTE — Anesthesia Preprocedure Evaluation (Signed)
Anesthesia Evaluation  Patient identified by MRN, date of birth, ID band Patient awake    Reviewed: Allergy & Precautions, H&P , NPO status , Patient's Chart, lab work & pertinent test results  Airway Mallampati: II   Neck ROM: full    Dental   Pulmonary asthma , sleep apnea    breath sounds clear to auscultation       Cardiovascular hypertension, +CHF  + dysrhythmias Atrial Fibrillation  Rhythm:regular Rate:Normal     Neuro/Psych    GI/Hepatic   Endo/Other    Renal/GU      Musculoskeletal  (+) Arthritis ,    Abdominal   Peds  Hematology   Anesthesia Other Findings   Reproductive/Obstetrics                             Anesthesia Physical Anesthesia Plan  ASA: 3  Anesthesia Plan: General   Post-op Pain Management:    Induction: Intravenous  PONV Risk Score and Plan: 3 and Ondansetron, Dexamethasone and Treatment may vary due to age or medical condition  Airway Management Planned: Oral ETT  Additional Equipment:   Intra-op Plan:   Post-operative Plan: Extubation in OR  Informed Consent: I have reviewed the patients History and Physical, chart, labs and discussed the procedure including the risks, benefits and alternatives for the proposed anesthesia with the patient or authorized representative who has indicated his/her understanding and acceptance.     Dental advisory given  Plan Discussed with: CRNA, Anesthesiologist and Surgeon  Anesthesia Plan Comments:        Anesthesia Quick Evaluation

## 2023-08-01 NOTE — Transfer of Care (Signed)
Immediate Anesthesia Transfer of Care Note  Patient: [name] [name]  Procedure(s) Performed: ATRIAL FIBRILLATION ABLATION  Patient Location: PACU and Cath Lab  Anesthesia Type:General  Level of Consciousness: awake, alert , and oriented  Airway & Oxygen Therapy: Patient Spontanous Breathing  Post-op Assessment: Report given to RN and Post -op Vital signs reviewed and stable  Post vital signs: Reviewed and stable  Last Vitals:  Vitals Value Taken Time  BP 130/99 08/01/23 1630  Temp    Pulse 97 08/01/23 1645  Resp 14 08/01/23 1645  SpO2 90 % 08/01/23 1645  Vitals shown include unfiled device data.  Last Pain:  Vitals:   08/01/23 1156  TempSrc: Oral  PainSc: 0-No pain         Complications: There were no known notable events for this encounter.

## 2023-08-01 NOTE — H&P (Signed)
Electrophysiology Office Note:    Date:  08/01/2023   ID:  [name], [name] 1950-12-09, MRN [id number]  PCP:  Mirna Mires, MD   [facility] Providers Cardiologist:  Chrystie Nose, MD Electrophysiologist:  Maurice Small, MD     Referring MD: Nelly Laurence Roberts Gaudy, MD   History of Present Illness:    [name] [name] is a 72 y.o. female with a hx listed below, significant for hypertension and atrial fibrillation, referred for arrhythmia management.  The patient was originally diagnosed with atrial fibrillation in 2017.  She had been doing well but was incidentally found to be in atrial fibrillation during an dentist visit in Mar 06, 2022.  She was discharged in rate controlled atrial fibrillation but subsequently admitted with heart failure and atrial fibrillation.  She has since undergone DC cardioversions and 07-Mar-2023 and, after additional recurrences, in November and December 2023.    She has palpitations, fatigue, shortness of breath but no chest pain, syncope, pre-syncope.  She underwent A-fib ablation on January 29, 2023.  She has done well until recently.  She ran of her flecainide and missed a dose on Friday and then had recurrence later that afternoon.  She has remained in atrial fibrillation since.  I reviewed the patient's CT and labs. There was no LAA thrombus. she  has not missed any doses of anticoagulation, and she took her dose last night. There have been no changes in the patient's diagnoses, medications, or condition since our recent clinic visit.    EKGs/Labs/Other Studies Reviewed Today:     TTE: 03-06-22 EF 60-65%. Moderately dilated LA  EKG:  Last EKG results: today - Atrial fibrillation   Recent Labs: 11/05/2022: Magnesium 1.8 07/17/2023: BUN 12; Creatinine, Ser 0.86; Hemoglobin 13.8; Platelets 108; Potassium 4.2; Sodium 140     Physical Exam:    VS:  BP (!) 140/77   Pulse (!) 51   Temp 99.1 F (37.3 C) (Oral)   Resp 18   Ht 5\' 3"  (1.6 m)   Wt 98 kg    SpO2 96%   BMI 38.26 kg/m     Wt Readings from Last 3 Encounters:  08/01/23 98 kg  07/22/23 98.1 kg  07/04/23 99.9 kg     GEN: Well nourished, well developed in no acute distress CARDIAC: iRRR, no murmurs, rubs, gallops RESPIRATORY:  Normal work of breathing MUSCULOSKELETAL: no edema    ASSESSMENT & PLAN:    Atrial fibrillation, persistent: symptomatic, failed flecainide.  Status post atrial fibrillation ablation January 29, 2023; she required DC cardioversion during the ablation. AF recurred after missing flecainide She presents today for AF ablation  Secondary hyercoagulable state: continue eliquis Obesity: I informed her that her risks of maintaining sinus rhythm will be significantly higher if she loses weight. I encouraged her to monitor her caloric intake and eat a higher proportion for vegetables and fruits and avoid fats and refined carbohydrates. Sleep apnea       Medication Adjustments/Labs and Tests Ordered: Current medicines are reviewed at length with the patient today.  Concerns regarding medicines are outlined above.  Orders Placed This Encounter  Procedures   Informed Consent Details: Physician/Practitioner Attestation; Transcribe to consent form and obtain patient signature   Initiate Pre-op Protocol   Void on call to EP Lab   Confirm CBC and BMP (or CMP) results within 7 days for inpatient and 30 days for outpatient:   Clip right and left femoral area PM before surgery   Clip  right internal jugular area PM before surgery   Pre-admission testing diagnosis   EP STUDY   Insert peripheral IV   Meds ordered this encounter  Medications   0.9 %  sodium chloride infusion     Signed, Maurice Small, MD  08/01/2023 1:19 PM    [facility]

## 2023-08-02 ENCOUNTER — Encounter (HOSPITAL_COMMUNITY): Payer: Self-pay | Admitting: Cardiovascular Disease

## 2023-08-02 ENCOUNTER — Telehealth: Payer: Self-pay | Admitting: Cardiovascular Disease

## 2023-08-02 MED ORDER — DILTIAZEM HCL ER COATED BEADS 180 MG PO CP24: 180.0000 mg | ORAL_CAPSULE | Freq: Every day | ORAL | 3 refills | Status: DC

## 2023-08-02 MED ORDER — FLECAINIDE ACETATE 50 MG PO TABS: 50.0000 mg | ORAL_TABLET | Freq: Two times a day (BID) | ORAL | 3 refills | Status: DC

## 2023-08-02 MED FILL — Fentanyl Citrate Preservative Free (PF) Inj 100 MCG/2ML: INTRAMUSCULAR | Qty: 2 | Status: AC

## 2023-08-02 NOTE — Anesthesia Postprocedure Evaluation (Signed)
Anesthesia Post Note  Patient: [name] [name]  Procedure(s) Performed: ATRIAL FIBRILLATION ABLATION     Patient location during evaluation: Cath Lab Anesthesia Type: General Level of consciousness: awake and alert Pain management: pain level controlled Vital Signs Assessment: post-procedure vital signs reviewed and stable Respiratory status: spontaneous breathing, nonlabored ventilation, respiratory function stable and patient connected to nasal cannula oxygen Cardiovascular status: blood pressure returned to baseline and stable Postop Assessment: no apparent nausea or vomiting Anesthetic complications: no   There were no known notable events for this encounter.  Last Vitals:  Vitals:   08/01/23 1900 08/01/23 1941  BP: 119/87   Pulse: 98 (!) 103  Resp: (!) 24 13  Temp:    SpO2: 91% 96%    Last Pain:  Vitals:   08/01/23 1941  TempSrc:   PainSc: 0-No pain                 Terez Montee S

## 2023-08-02 NOTE — Telephone Encounter (Signed)
Called patient back about her message. Patient stated she had an ablation yesterday and was discharged, and all night and this morning her HR has been from 106-109. Patient stated before the ablation her HR was in the 50's. Patient's BP right now is 128/80. Patient stated she took metoprolol 37.5 mg, flecainide 50 mg, and diltiazem 180 mg this morning around 7:30 am. On discharge instructions from hospital it states to stop flecainide and diltiazem. Patient stated these two medications were highlighted to continue. Will double check with Dr. Nelly Laurence.  Per Dr. Nelly Laurence, "I wanted her to stay on the medications she was taking prior to the procedure -- she had recurrence of AF after the meds were reconciled, just before she left.  she can take an extra half tablet of metoprolol (50mg  total) over the weekend. Hopefully, she'll go back in rhythm. We may need to consider alternative antiarrhythmic meds "  Called patient to let her know she can take an extra dose of metoprolol 12.5 (1/2 tablet of the 25 mg) over the weekend. Encouraged patient to call our office on Monday to let us know if this did not help and Dr. Nelly Laurence would consider changing her medications. Patient is to continue her diltiazem and flecainide, will update her medication list. Patient verbalized understanding and thank [place] for the call back.

## 2023-08-02 NOTE — Telephone Encounter (Signed)
STAT if HR is under 50 or over 120 (normal HR is 60-100 beats per minute)  What is your heart rate? 109  Do you have a log of your heart rate readings (document readings)? No  Do you have any other symptoms? No, just concerned about it being 109 all night and she's concerned. She had a heart ablation done yesterday. Please advise

## 2023-08-12 DIAGNOSIS — G4733 Obstructive sleep apnea (adult) (pediatric): Secondary | ICD-10-CM | POA: Diagnosis not present

## 2023-08-12 DIAGNOSIS — I10 Essential (primary) hypertension: Secondary | ICD-10-CM | POA: Diagnosis not present

## 2023-08-12 DIAGNOSIS — I5032 Chronic diastolic (congestive) heart failure: Secondary | ICD-10-CM | POA: Diagnosis not present

## 2023-08-29 ENCOUNTER — Ambulatory Visit (HOSPITAL_COMMUNITY)
Admission: RE | Admit: 2023-08-29 | Discharge: 2023-08-29 | Disposition: A | Discharge status: Home / Self Care | Payer: No Typology Code available for payment source | Source: Ambulatory Visit | Attending: Physician Assistant | Admitting: Physician Assistant

## 2023-08-29 ENCOUNTER — Encounter (HOSPITAL_COMMUNITY): Payer: Self-pay | Admitting: Physician Assistant

## 2023-08-29 VITALS — BP 150/92 | HR 49 | Ht 63.0 in | Wt 218.2 lb

## 2023-08-29 DIAGNOSIS — I4719 Other supraventricular tachycardia: Secondary | ICD-10-CM | POA: Diagnosis not present

## 2023-08-29 DIAGNOSIS — Z79899 Other long term (current) drug therapy: Secondary | ICD-10-CM | POA: Diagnosis not present

## 2023-08-29 DIAGNOSIS — G4733 Obstructive sleep apnea (adult) (pediatric): Secondary | ICD-10-CM | POA: Diagnosis not present

## 2023-08-29 DIAGNOSIS — I4819 Other persistent atrial fibrillation: Secondary | ICD-10-CM | POA: Insufficient documentation

## 2023-08-29 DIAGNOSIS — D6869 Other thrombophilia: Secondary | ICD-10-CM | POA: Insufficient documentation

## 2023-08-29 DIAGNOSIS — Z6838 Body mass index (BMI) 38.0-38.9, adult: Secondary | ICD-10-CM | POA: Insufficient documentation

## 2023-08-29 DIAGNOSIS — Z7901 Long term (current) use of anticoagulants: Secondary | ICD-10-CM | POA: Insufficient documentation

## 2023-08-29 DIAGNOSIS — E669 Obesity, unspecified: Secondary | ICD-10-CM | POA: Diagnosis not present

## 2023-08-29 MED ORDER — METOPROLOL TARTRATE 25 MG PO TABS: 37.5000 mg | ORAL_TABLET | Freq: Two times a day (BID) | ORAL | 3 refills | Status: DC

## 2023-08-29 NOTE — Progress Notes (Signed)
Primary Care Physician: Mirna Mires, MD Primary Cardiologist: Dr Rennis Golden Primary Electrophysiologist: Dr Nelly Laurence Referring Physician: [facility]   [name] [name] is a 72 y.o. female with a history of HTN, OSA, asthma, atrial fibrillation who presents for follow up in the [facility].  The patient was initially diagnosed with atrial fibrillation in 2017. She had done well until 02/2022 when she was admitted for incidentally found afib with RVR from the dentist office. She was discharged in rate controlled afib but was readmitted 03/11/22 with afib and CHF. She underwent DCCV at that time. Patient is on Eliquis for a CHADS2VASC score of 3. She was seen in the ED 10/20/22 for afib and underwent DCCV again. She denies significant alcohol use. At her visit in the AF clinic 10/29/22 she deferred decision about rhythm control until her visit with Dr Rennis Golden.  Patient was seen again at that ED 11/05/22 with tachypalpitations. She was rate controlled in the ED and did not undergo DCCV at that time. She was started on flecainide and underwent DCCV on 11/22/22 but had quick return of her afib. She was seen by Dr Nelly Laurence and underwent afib ablation 01/29/23. She had recurrence of her afib and underwent DCCV on 05/17/23 and repeat afib ablation on 08/01/23.  On follow up today, patient reports that she has done well since her ablation. She did have one episode of tachypalpitations one week ago which resolved in about 1 hour with an extra PRN dose of BB. She denies chest pain, swallowing pain, or groin issues.   Today, she denies symptoms of palpitations, chest pain, shortness of breath, orthopnea, PND, lower extremity edema, dizziness, presyncope, syncope, bleeding, or neurologic sequela. The patient is tolerating medications without difficulties and is otherwise without complaint today.    Atrial Fibrillation Risk Factors:  she does have symptoms or diagnosis of sleep apnea. she does not  have a history of rheumatic fever. she does not have a history of alcohol use. The patient does have a history of early familial atrial fibrillation or other arrhythmias. Mother had afib.   Atrial Fibrillation Management history:  Previous antiarrhythmic drugs: flecainide  Previous cardioversions: 03/15/22, 10/20/22, 11/22/22, 05/17/23 Previous ablations: 01/29/23, 08/01/23 Anticoagulation history: Eliquis   Past Medical History:  Diagnosis Date   A-fib (HCC)    Anemia    during pneumonia   Arthritis    Asthma    Hypertension    OSA (obstructive sleep apnea)    moderate obstructive sleep apnea with an AHI of 16.4/h.  On CPAP at 5cm H2O    Current Outpatient Medications  Medication Sig Dispense Refill   acetaminophen (TYLENOL) 500 MG tablet Take 1,000 mg by mouth every 6 (six) hours as needed for mild pain or headache.     apixaban (ELIQUIS) 5 MG TABS tablet Take 1 tablet by mouth twice daily 60 tablet 5   APPLE CIDER VINEGAR PO Take 450 mg by mouth every morning.     busPIRone (BUSPAR) 5 MG tablet Take 10 mg by mouth at bedtime.     Cholecalciferol (VITAMIN D) 50 MCG (2000 UT) tablet Take 2,000 Units by mouth daily.     diltiazem (CARDIZEM CD) 180 MG 24 hr capsule Take 1 capsule (180 mg total) by mouth daily. 90 capsule 3   flecainide (TAMBOCOR) 50 MG tablet Take 1 tablet (50 mg total) by mouth 2 (two) times daily. 180 tablet 3   fluticasone (FLONASE) 50 MCG/ACT nasal spray Place 2  sprays into both nostrils daily as needed for allergies.     furosemide (LASIX) 20 MG tablet Take 1 tablet (20 mg total) by mouth daily as needed. 30 tablet 6   Magnesium 400 MG CAPS Take 400 mg by mouth every morning.     metoprolol tartrate (LOPRESSOR) 25 MG tablet Take 1.5 tablets (37.5 mg total) by mouth 2 (two) times daily. 90 tablet 6   Multiple Vitamin (MULTIVITAMIN WITH MINERALS) TABS tablet Take 1 tablet by mouth daily.     NON FORMULARY Pt uses a cpap nightly     Potassium 99 MG TABS Take 99 mg  by mouth daily.     vitamin k 100 MCG tablet Take 100 mcg by mouth daily.     pantoprazole (PROTONIX) 40 MG tablet Take 1 tablet (40 mg total) by mouth daily. (Patient not taking: Reported on 05/14/2023) 45 tablet 0   No current facility-administered medications for this encounter.    ROS- All systems are reviewed and negative except as per the HPI above.  Physical Exam: Vitals:   08/29/23 1121  BP: (!) 150/92  Pulse: (!) 49  Weight: 99 kg  Height: 5\' 3"  (1.6 m)    GEN: Well nourished, well developed in no acute distress NECK: No JVD; No carotid bruits CARDIAC: Regular rate and rhythm, no murmurs, rubs, gallops RESPIRATORY:  Clear to auscultation without rales, wheezing or rhonchi  ABDOMEN: Soft, non-tender, non-distended EXTREMITIES:  No edema; No deformity    Wt Readings from Last 3 Encounters:  08/29/23 99 kg  08/01/23 98 kg  07/22/23 98.1 kg    EKG today demonstrates  SB, 1st degree AV block Vent. rate 49 BPM PR interval 240 ms QRS duration 96 ms QT/QTcB 486/439 ms  Echo 03/01/22 demonstrated   1. Left ventricular ejection fraction, by estimation, is 60 to 65%. Left  ventricular ejection fraction by 2D MOD biplane is 61.6 %. The left  ventricle has normal function. The left ventricle has no regional wall  motion abnormalities. There is mild left ventricular hypertrophy. Left ventricular diastolic function could not be evaluated.   2. Right ventricular systolic function is normal. The right ventricular  size is normal. There is mildly elevated pulmonary artery systolic  pressure. The estimated right ventricular systolic pressure is 36.3 mmHg.   3. Left atrial size was moderately dilated.   4. The mitral valve is grossly normal. Mild mitral valve regurgitation.   5. The aortic valve is tricuspid. Aortic valve regurgitation is trivial.  Aortic valve sclerosis is present, with no evidence of aortic valve  stenosis. Aortic regurgitation PHT measures 518 msec.   6. The  inferior vena cava is dilated in size with >50% respiratory  variability, suggesting right atrial pressure of 8 mmHg.   Epic records are reviewed at length today  CHA2DS2-VASc Score = 4  The patient's score is based upon: CHF History: 1 HTN History: 1 Diabetes History: 0 Stroke History: 0 Vascular Disease History: 0 Age Score: 1 Gender Score: 1        ASSESSMENT AND PLAN: Persistent Atrial Fibrillation (ICD10:  I48.19) The patient's CHA2DS2-VASc score is 4, indicating a 4.8% annual risk of stroke.   S/p afib ablation 01/29/23 and 08/01/23 Continue flecainide 50 mg BID Continue Eliquis 5 mg BID with no missed doses for 3 months post ablation.  Continue diltiazem 180 mg daily Continue Lopressor 37.5 mg BID  Secondary Hypercoagulable State (ICD10:  D68.69) The patient is at significant risk for stroke/thromboembolism based upon  her CHA2DS2-VASc Score of 4.  Continue Apixaban (Eliquis).   Obesity Body mass index is 38.65 kg/m.  Encouraged lifestyle modification  OSA  Encouraged nightly CPAP Followed by Dr Mayford Knife   Follow up with Dr Nelly Laurence as scheduled.    Jorja Loa PA-C [facility] [address] [place], [place] [address] [phone] 08/29/2023 11:31 AM

## 2023-08-29 NOTE — Addendum Note (Signed)
Encounter addended by: Shona Simpson, RN on: 08/29/2023 2:01 PM  Actions taken: Pharmacy for encounter modified, Order list changed

## 2023-09-06 ENCOUNTER — Other Ambulatory Visit (HOSPITAL_COMMUNITY): Payer: Self-pay

## 2023-09-06 MED ORDER — METOPROLOL TARTRATE 25 MG PO TABS: 37.5000 mg | ORAL_TABLET | Freq: Two times a day (BID) | ORAL | 3 refills | Status: DC

## 2023-09-11 DIAGNOSIS — I5032 Chronic diastolic (congestive) heart failure: Secondary | ICD-10-CM | POA: Diagnosis not present

## 2023-09-11 DIAGNOSIS — I10 Essential (primary) hypertension: Secondary | ICD-10-CM | POA: Diagnosis not present

## 2023-09-11 DIAGNOSIS — G4733 Obstructive sleep apnea (adult) (pediatric): Secondary | ICD-10-CM | POA: Diagnosis not present

## 2023-10-10 DIAGNOSIS — G4733 Obstructive sleep apnea (adult) (pediatric): Secondary | ICD-10-CM | POA: Diagnosis not present

## 2023-10-12 DIAGNOSIS — G4733 Obstructive sleep apnea (adult) (pediatric): Secondary | ICD-10-CM | POA: Diagnosis not present

## 2023-10-12 DIAGNOSIS — I5032 Chronic diastolic (congestive) heart failure: Secondary | ICD-10-CM | POA: Diagnosis not present

## 2023-10-12 DIAGNOSIS — I10 Essential (primary) hypertension: Secondary | ICD-10-CM | POA: Diagnosis not present

## 2023-10-14 DIAGNOSIS — Z7901 Long term (current) use of anticoagulants: Secondary | ICD-10-CM | POA: Diagnosis not present

## 2023-10-14 DIAGNOSIS — E7800 Pure hypercholesterolemia, unspecified: Secondary | ICD-10-CM | POA: Diagnosis not present

## 2023-10-14 DIAGNOSIS — F4321 Adjustment disorder with depressed mood: Secondary | ICD-10-CM | POA: Diagnosis not present

## 2023-10-14 DIAGNOSIS — I10 Essential (primary) hypertension: Secondary | ICD-10-CM | POA: Diagnosis not present

## 2023-10-14 DIAGNOSIS — R001 Bradycardia, unspecified: Secondary | ICD-10-CM | POA: Diagnosis not present

## 2023-10-14 DIAGNOSIS — I480 Paroxysmal atrial fibrillation: Secondary | ICD-10-CM | POA: Diagnosis not present

## 2023-11-04 NOTE — Progress Notes (Unsigned)
  Electrophysiology Office Note:   Date:  11/04/2023  ID:  [name], [name] [birth year], MRN [id number]  Primary Cardiologist: Chrystie Nose, MD Electrophysiologist: Maurice Small, MD  {Click to update primary MD,subspecialty MD or APP then REFRESH:1}    History of Present Illness:   [name] [name] is a 72 y.o. female with h/o HTN, OSA, Asthma, and PAF seen today for routine electrophysiology followup.   S/p AF ablation by Dr. Nelly Laurence 08/01/2023.  Since last being seen in our clinic the patient reports doing ***.  she denies chest pain, palpitations, dyspnea, PND, orthopnea, nausea, vomiting, dizziness, syncope, edema, weight gain, or early satiety.   Review of systems complete and found to be negative unless listed in HPI.   EP Information / Studies Reviewed:    EKG is ordered today. Personal review as below.       Arrhythmia History  S/p ablation 01/29/2023 and redo 08/01/2023  Physical Exam:   VS:  There were no vitals taken for this visit.   Wt Readings from Last 3 Encounters:  08/29/23 218 lb 3.2 oz (99 kg)  08/01/23 216 lb (98 kg)  07/22/23 216 lb 3.2 oz (98.1 kg)     GEN: Well nourished, well developed in no acute distress NECK: No JVD; No carotid bruits CARDIAC: {EPRHYTHM:28826}, no murmurs, rubs, gallops RESPIRATORY:  Clear to auscultation without rales, wheezing or rhonchi  ABDOMEN: Soft, non-tender, non-distended EXTREMITIES:  No edema; No deformity   ASSESSMENT AND PLAN:    Persistent AF EKG today shows *** S/p ablation 01/29/2023 and redo 08/01/2023 Continue flecainide 50 mg BID Continue eliquis 5 mg BID for CHA2DS2/VASc of at least 4 Continue diltiazem 180 mg daily Continue lopressor 37.5 mg daily  Secondary hypercoagulable state Pt on Eliquis as above    Obesity There is no height or weight on file to calculate BMI.  Encouraged lifestyle modification   OSA  Encouraged nightly CPAP    {Click here to Review PMH, Prob List, Meds, Allergies, SHx, FHx   :1}   Follow up with {GNFAO:13086} {EPFOLLOW VH:84696}  Signed, Graciella Freer, PA-C

## 2023-11-05 ENCOUNTER — Ambulatory Visit: Payer: No Typology Code available for payment source | Attending: Student | Admitting: Student

## 2023-11-05 ENCOUNTER — Other Ambulatory Visit: Payer: Self-pay

## 2023-11-05 ENCOUNTER — Encounter: Payer: Self-pay | Admitting: Student

## 2023-11-05 VITALS — BP 122/84 | HR 49 | Ht 63.0 in | Wt 218.4 lb

## 2023-11-05 DIAGNOSIS — I4819 Other persistent atrial fibrillation: Secondary | ICD-10-CM

## 2023-11-05 DIAGNOSIS — I10 Essential (primary) hypertension: Secondary | ICD-10-CM | POA: Diagnosis not present

## 2023-11-05 DIAGNOSIS — E66812 Obesity, class 2: Secondary | ICD-10-CM | POA: Diagnosis not present

## 2023-11-05 DIAGNOSIS — G4733 Obstructive sleep apnea (adult) (pediatric): Secondary | ICD-10-CM | POA: Diagnosis not present

## 2023-11-05 NOTE — Patient Instructions (Signed)
Medication Instructions:  Your physician recommends that you continue on your current medications as directed. Please refer to the Current Medication list given to you today.  *If you need a refill on your cardiac medications before your next appointment, please call your pharmacy*  Lab Work: None ordered If you have labs (blood work) drawn today and your tests are completely normal, you will receive your results only by: MyChart Message (if you have MyChart) OR A paper copy in the mail If you have any lab test that is abnormal or we need to change your treatment, we will call you to review the results.  Follow-Up: At [facility], you and your health needs are our priority.  As part of our continuing mission to provide you with exceptional heart care, we have created designated Provider Care Teams.  These Care Teams include your primary Cardiologist (physician) and Advanced Practice Providers (APPs -  Physician Assistants and Nurse Practitioners) who all work together to provide you with the care you need, when you need it.  Your next appointment:   3 month(s)  Provider:   York Pellant, MD

## 2023-11-07 ENCOUNTER — Ambulatory Visit: Payer: No Typology Code available for payment source | Admitting: Cardiovascular Disease

## 2023-11-11 DIAGNOSIS — I5032 Chronic diastolic (congestive) heart failure: Secondary | ICD-10-CM | POA: Diagnosis not present

## 2023-11-11 DIAGNOSIS — G4733 Obstructive sleep apnea (adult) (pediatric): Secondary | ICD-10-CM | POA: Diagnosis not present

## 2023-11-11 DIAGNOSIS — I10 Essential (primary) hypertension: Secondary | ICD-10-CM | POA: Diagnosis not present

## 2023-11-19 ENCOUNTER — Other Ambulatory Visit: Payer: Self-pay | Admitting: Student

## 2023-11-19 DIAGNOSIS — I4819 Other persistent atrial fibrillation: Secondary | ICD-10-CM

## 2023-11-19 NOTE — Telephone Encounter (Signed)
Prescription refill request for Eliquis received. Indication: a fib Last office visit: 08/29/23 Scr: 0.86 07/17/23 epic Age: 72 Weight: 99kg

## 2023-11-25 ENCOUNTER — Other Ambulatory Visit: Payer: Self-pay | Admitting: Internal Medicine

## 2023-11-25 DIAGNOSIS — I4819 Other persistent atrial fibrillation: Secondary | ICD-10-CM

## 2023-11-25 MED ORDER — APIXABAN 5 MG PO TABS: 5.0000 mg | ORAL_TABLET | Freq: Two times a day (BID) | ORAL | 5 refills | Status: DC

## 2023-11-25 NOTE — Telephone Encounter (Signed)
 Pt is requesting an Eliquis refill

## 2023-11-25 NOTE — Telephone Encounter (Signed)
Prescription refill request for Eliquis received. Indication:afib Last office visit:12/24 Scr:0.86  8/24 Age: 72 Weight:99.1  kg  Prescription refilled

## 2023-11-25 NOTE — Telephone Encounter (Signed)
*  STAT* If patient is at the pharmacy, call can be transferred to refill team.   1. Which medications need to be refilled? (please list name of each medication and dose if known) apixaban (ELIQUIS) 5 MG TABS tablet    2. Would you like to learn more about the convenience, safety, & potential cost savings by using the [facility]?      3. Are you open to using the [facility] (Type [facility]. ).   4. Which pharmacy/location (including street and city if local pharmacy) is medication to be sent to? [facility] [address] [place] - [address]    5. Do they need a 30 day or 90 day supply? 30

## 2023-12-12 DIAGNOSIS — G4733 Obstructive sleep apnea (adult) (pediatric): Secondary | ICD-10-CM | POA: Diagnosis not present

## 2023-12-12 DIAGNOSIS — I10 Essential (primary) hypertension: Secondary | ICD-10-CM | POA: Diagnosis not present

## 2023-12-12 DIAGNOSIS — I5032 Chronic diastolic (congestive) heart failure: Secondary | ICD-10-CM | POA: Diagnosis not present

## 2024-01-06 ENCOUNTER — Telehealth: Payer: Self-pay | Admitting: Cardiovascular Disease

## 2024-01-06 ENCOUNTER — Other Ambulatory Visit (HOSPITAL_COMMUNITY): Payer: Self-pay

## 2024-01-06 MED ORDER — METOPROLOL TARTRATE 25 MG PO TABS
37.5000 mg | ORAL_TABLET | Freq: Two times a day (BID) | ORAL | 2 refills | Status: DC
  Filled 2024-01-06: qty 135, 45d supply, fill #0

## 2024-01-06 NOTE — Telephone Encounter (Signed)
*  STAT* If patient is at the pharmacy, call can be transferred to refill team.   1. Which medications need to be refilled? (please list name of each medication and dose if known) metoprolol  tartrate (LOPRESSOR ) 25 MG tablet   Take 1.5 tablets (37.5 mg total) by mouth 2 (two) times daily.   2. Would you like to learn more about the convenience, safety, & potential cost savings by using the [facility]? No   3. Are you open to using the [facility] No   4. Which pharmacy/location (including street and city if local pharmacy) is medication to be sent to?[facility] - [place], [place] - [address]    5. Do they need a 30 day or 90 day supply? 30 Day Supply

## 2024-01-08 ENCOUNTER — Other Ambulatory Visit (HOSPITAL_COMMUNITY): Payer: Self-pay

## 2024-01-08 ENCOUNTER — Telehealth: Payer: Self-pay | Admitting: Internal Medicine

## 2024-01-08 MED ORDER — METOPROLOL TARTRATE 25 MG PO TABS: 37.5000 mg | ORAL_TABLET | Freq: Two times a day (BID) | ORAL | 2 refills | Status: DC

## 2024-01-08 NOTE — Telephone Encounter (Signed)
Pt's medication was resent to pt's pharmacy as requested. Confirmation received.

## 2024-01-08 NOTE — Telephone Encounter (Signed)
Pt c/o medication issue:  1. Name of Medication:  metoprolol tartrate (LOPRESSOR) 25 MG tablet   2. How are you currently taking this medication (dosage and times per day)? As prescribed  3. Are you having a reaction (difficulty breathing--STAT)? No   4. What is your medication issue? Medication was sent to the wrong pharmacy. Was supposed to be sent to [facility].   Please advise.

## 2024-01-12 DIAGNOSIS — G4733 Obstructive sleep apnea (adult) (pediatric): Secondary | ICD-10-CM | POA: Diagnosis not present

## 2024-01-12 DIAGNOSIS — I5032 Chronic diastolic (congestive) heart failure: Secondary | ICD-10-CM | POA: Diagnosis not present

## 2024-01-12 DIAGNOSIS — I10 Essential (primary) hypertension: Secondary | ICD-10-CM | POA: Diagnosis not present

## 2024-01-22 NOTE — Progress Notes (Unsigned)
 Cardiology Office Note    Date:  01/23/2024  ID:  [name] [name] 12-27-1950, MRN [id number] PCP:  [name] Mires, MD  Cardiologist:  [name] Nose, MD  Electrophysiologist:  [name] Small, MD   Chief Complaint: Follow up for atrial fibrillation   History of Present Illness: .    [name] [name] is a 73 y.o. female with visit-pertinent history of PAF s/p ablation in 01/2023 and 07/2023, chronic diastolic heart failure, hypertension.  First evaluate by Dr. Rennis [name] in 01/2016 during ER visit for palpitations.  She converted to normal sinus with IV Cardizem with a short recurrence while still in the ER.  She was started on oral diltiazem and Eliquis.  She had a normal, low risk stress test in March 2017.  In 02/2022 she was found to have elevated heart rate at the dentist and presented the ER the following day where she was found to be in A-fib with RVR.  Her Cardizem was increased and she was discharged for outpatient follow-up.  A week later she returned to the ER with increased shortness of breath and found to be again in A-fib with RVR with evidence of fluid overload.  She was diuresed with IV Lasix, given IV Lopressor for heart rate control and IV Cardizem.  She was admitted and underwent successful cardioversion in 02/2022.  She underwent echo with preserved EF.  In 09/2022 she returned to the ER for elevated heart rate with no response to oral medications at home.  She was cardioverted to normal sinus rhythm in ER.  She was evaluated by A-fib clinic and by Dr. Nelly [name].  On 01/29/2023 she underwent A-fib ablation with Dr. Nelly [name], during procedure she required cardioversion, she was later started on flecainide.  She was seen by Dr. Nelly [name] in clinic on 05/14/2023 for recurrence of atrial fibrillation after missing doses of flecainide the week prior.  She underwent successful cardioversion on 05/17/2023.  She was last seen in clinic by Dr. Rennis [name] on 07/22/2023, she remained stable from cardiac perspective.  On  08/01/2023 she underwent repeat ablation with Dr. Nelly [name].  She was seen in follow-up on 08/29/2023 in the A-fib clinic and was doing well, she reported 1 episode of tachypalpitations a week prior to visit which resolved in an hour with an extra dose of beta-blocker.  She was last seen by EP on 11/05/2023, she was continued on flecainide 50 mg twice daily, Eliquis 5 mg twice daily, diltiazem 180 mg daily and Lopressor 37.5 mg daily.  Today she presents for follow-up.  She reports that she is doing very well.  She has no cardiac concerns or complaints today she reports that her atrial fibrillation has been overall well-controlled.  She notes that about 5 weeks ago she woke up at 2:20 AM in atrial fibrillation with heart rate ranging from 88 to 111 bpm, this lasted for about 1 hour, she did take an extra dose of metoprolol.  She denies any recurrence.  She denies chest pain, shortness of breath, lower extremity edema, orthopnea or PND.  She denies dizziness, lightheadedness presyncope or syncope.  She is looking forward to warmer weather so she can get out and start gardening again. ROS: .   Today she denies chest pain, shortness of breath, lower extremity edema, fatigue, melena, hematuria, hemoptysis, diaphoresis, weakness, presyncope, syncope, orthopnea, and PND.  All other systems are reviewed and otherwise negative. Studies Reviewed: Marland Kitchen    EKG:  EKG is ordered today, personally reviewed, demonstrating  EKG Interpretation Date/Time:  Thursday January 23 2024 11:15:26 EST Ventricular Rate:  47 PR Interval:  214 QRS Duration:  100 QT Interval:  494 QTC Calculation: 437 R Axis:   -13  Text Interpretation: Sinus bradycardia with 1st degree A-V block Cannot rule out Anterior infarct (cited on or before 29-Aug-2023) When compared with ECG of 05-Nov-2023 10:59, No significant change was found Confirmed by [name] [name] [phone]) on 01/23/2024 11:22:12 AM   CV Studies:  Cardiac Studies & Procedures    ______________________________________________________________________________________________   STRESS TESTS  EXERCISE TOLERANCE TEST (ETT) 02/22/2016  Narrative  There was no ST segment deviation noted during stress.  Negative but non diagnostic exercise treadmill due to achievement of only 87% of maximum predicted HR   ECHOCARDIOGRAM  ECHOCARDIOGRAM COMPLETE 03/01/2022  Narrative ECHOCARDIOGRAM REPORT    Patient Name:   [name] [name] Date of Exam: 03/01/2022 Medical Rec #:  [id number]    Height:       64.0 in Accession #:    [id number]   Weight:       224.6 lb Date of Birth:  Jul 21, 1951    BSA:          2.055 m Patient Age:    70 years     BP:           117/90 mmHg Patient Gender: F            HR:           66 bpm. Exam Location:  Inpatient  Procedure: 2D Echo, Cardiac Doppler and Color Doppler  Indications:    Afib  History:        Patient has no prior history of Echocardiogram examinations. CHF, Arrythmias:Atrial Fibrillation; Risk Factors:Hypertension.  Sonographer:    [name] [name] Referring Phys: [id number] [name] [name]  IMPRESSIONS   1. Left ventricular ejection fraction, by estimation, is 60 to 65%. Left ventricular ejection fraction by 2D MOD biplane is 61.6 %. The left ventricle has normal function. The left ventricle has no regional wall motion abnormalities. There is mild left ventricular hypertrophy. Left ventricular diastolic function could not be evaluated. 2. Right ventricular systolic function is normal. The right ventricular size is normal. There is mildly elevated pulmonary artery systolic pressure. The estimated right ventricular systolic pressure is 36.3 mmHg. 3. Left atrial size was moderately dilated. 4. The mitral valve is grossly normal. Mild mitral valve regurgitation. 5. The aortic valve is tricuspid. Aortic valve regurgitation is trivial. Aortic valve sclerosis is present, with no evidence of aortic valve stenosis. Aortic regurgitation PHT  measures 518 msec. 6. The inferior vena cava is dilated in size with >50% respiratory variability, suggesting right atrial pressure of 8 mmHg.  Comparison(s): No prior Echocardiogram.  FINDINGS Left Ventricle: Left ventricular ejection fraction, by estimation, is 60 to 65%. Left ventricular ejection fraction by 2D MOD biplane is 61.6 %. The left ventricle has normal function. The left ventricle has no regional wall motion abnormalities. The left ventricular internal cavity size was normal in size. There is mild left ventricular hypertrophy. Left ventricular diastolic function could not be evaluated due to atrial fibrillation. Left ventricular diastolic function could not be evaluated.  Right Ventricle: The right ventricular size is normal. No increase in right ventricular wall thickness. Right ventricular systolic function is normal. There is mildly elevated pulmonary artery systolic pressure. The tricuspid regurgitant velocity is 2.66 m/s, and with an assumed right atrial pressure of 8 mmHg, the estimated right ventricular systolic pressure is 36.3  mmHg.  Left Atrium: Left atrial size was moderately dilated.  Right Atrium: Right atrial size was normal in size.  Pericardium: There is no evidence of pericardial effusion.  Mitral Valve: The mitral valve is grossly normal. Mild mitral valve regurgitation.  Tricuspid Valve: The tricuspid valve is grossly normal. Tricuspid valve regurgitation is mild.  Aortic Valve: The aortic valve is tricuspid. Aortic valve regurgitation is trivial. Aortic regurgitation PHT measures 518 msec. Aortic valve sclerosis is present, with no evidence of aortic valve stenosis. Aortic valve peak gradient measures 6.1 mmHg.  Pulmonic Valve: The pulmonic valve was grossly normal. Pulmonic valve regurgitation is trivial.  Aorta: The aortic root and ascending aorta are structurally normal, with no evidence of dilitation.  Venous: The inferior vena cava is dilated in size  with greater than 50% respiratory variability, suggesting right atrial pressure of 8 mmHg.  IAS/Shunts: There is right bowing of the interatrial septum, suggestive of elevated left atrial pressure. No atrial level shunt detected by color flow Doppler.   LEFT VENTRICLE PLAX 2D                        Biplane EF (MOD) LVIDd:         4.10 cm         LV Biplane EF:   Left LVIDs:         2.60 cm                          ventricular LV PW:         1.00 cm                          ejection LV IVS:        1.30 cm                          fraction by LVOT diam:     2.20 cm                          2D MOD LV SV:         71                               biplane is LV SV Index:   35                               61.6 %. LVOT Area:     3.80 cm Diastology LV e' medial:    8.27 cm/s LV Volumes (MOD)               LV E/e' medial:  15.7 LV vol d, MOD    97.6 ml       LV e' lateral:   8.59 cm/s A2C:                           LV E/e' lateral: 15.1 LV vol d, MOD    84.3 ml A4C: LV vol s, MOD    37.8 ml A2C: LV vol s, MOD    32.4 ml A4C: LV SV MOD A2C:   59.8 ml LV SV MOD A4C:   84.3 ml LV SV MOD  BP:    56.7 ml  RIGHT VENTRICLE             IVC RV Basal diam:  3.50 cm     IVC diam: 2.40 cm RV Mid diam:    2.00 cm RV S prime:     12.20 cm/s TAPSE (M-mode): 1.9 cm  LEFT ATRIUM              Index        RIGHT ATRIUM           Index LA diam:        4.80 cm  2.34 cm/m   RA Area:     12.40 cm LA Vol (A2C):   67.6 ml  32.89 ml/m  RA Volume:   21.90 ml  10.65 ml/m LA Vol (A4C):   101.0 ml 49.14 ml/m LA Biplane Vol: 84.6 ml  41.16 ml/m AORTIC VALVE AV Area (Vmax): 2.81 cm AV Vmax:        123.00 cm/s AV Peak Grad:   6.1 mmHg LVOT Vmax:      90.80 cm/s LVOT Vmean:     66.200 cm/s LVOT VTI:       0.188 m AI PHT:         518 msec  AORTA Ao Root diam: 3.30 cm Ao Asc diam:  3.40 cm  MITRAL VALVE                TRICUSPID VALVE MV Area (PHT): 3.81 cm     TR Peak grad:   28.3 mmHg MV Decel  Time: 199 msec     TR Vmax:        266.00 cm/s MR Peak grad: 69.0 mmHg MR Mean grad: 51.0 mmHg     SHUNTS MR Vmax:      415.33 cm/s   Systemic VTI:  0.19 m MR Vmean:     346.0 cm/s    Systemic Diam: 2.20 cm MV E velocity: 130.00 cm/s  Zoila Shutter MD Electronically signed by Zoila Shutter MD Signature Date/Time: 03/01/2022/2:27:56 PM    Final          ______________________________________________________________________________________________      Current Reported Medications:.    Current Meds  Medication Sig   acetaminophen (TYLENOL) 500 MG tablet Take 1,000 mg by mouth every 6 (six) hours as needed for mild pain or headache.   apixaban (ELIQUIS) 5 MG TABS tablet Take 1 tablet (5 mg total) by mouth 2 (two) times daily.   APPLE CIDER VINEGAR PO Take 450 mg by mouth every morning.   busPIRone (BUSPAR) 5 MG tablet Take 10 mg by mouth at bedtime.   Cholecalciferol (VITAMIN D) 50 MCG (2000 UT) tablet Take 2,000 Units by mouth daily.   flecainide (TAMBOCOR) 50 MG tablet Take 1 tablet (50 mg total) by mouth 2 (two) times daily.   fluticasone (FLONASE) 50 MCG/ACT nasal spray Place 2 sprays into both nostrils daily as needed for allergies.   Magnesium 400 MG CAPS Take 400 mg by mouth every morning.   metoprolol tartrate (LOPRESSOR) 25 MG tablet Take 1.5 tablets (37.5 mg total) by mouth 2 (two) times daily.   Multiple Vitamin (MULTIVITAMIN WITH MINERALS) TABS tablet Take 1 tablet by mouth daily.   NON FORMULARY Pt uses a cpap nightly   Potassium 99 MG TABS Take 99 mg by mouth daily.   vitamin k 100 MCG tablet Take 100 mcg by mouth daily.   [DISCONTINUED] diltiazem (CARDIZEM CD) 180 MG 24 hr  capsule Take 1 capsule (180 mg total) by mouth daily.   [DISCONTINUED] furosemide (LASIX) 20 MG tablet Take 1 tablet (20 mg total) by mouth daily as needed.   Physical Exam:    VS:  BP 130/72 (BP Location: Left Arm, Patient Position: Sitting, Cuff Size: Large)   Pulse (!) 47   Ht 5\' 3"  (1.6  m)   Wt 220 lb (99.8 kg)   SpO2 96%   BMI 38.97 kg/m    Wt Readings from Last 3 Encounters:  01/23/24 220 lb (99.8 kg)  11/05/23 218 lb 6.4 oz (99.1 kg)  08/29/23 218 lb 3.2 oz (99 kg)    GEN: Well nourished, well developed in no acute distress NECK: No JVD; No carotid bruits CARDIAC: RRR, no murmurs, rubs, gallops RESPIRATORY:  Clear to auscultation without rales, wheezing or rhonchi  ABDOMEN: Soft, non-tender, non-distended EXTREMITIES:  No edema; No acute deformity   Asessement and Plan:.    Persistent A-fib: S/p ablation on 01/29/2023 and redo on 08/01/2023.  EKG today indicates sinus bradycardia at 47 bpm.  Patient notes 1 episode of palpitations about 5 weeks ago that woke her from sleep and lasted an hour after she took an extra dose of metoprolol.  She denies any other palpitations or feeling of being in atrial fibrillation.  She denies dizziness, lightheadedness presyncope or syncope.  She denies any significant bleeding problems on Eliquis.  Patient tolerating flecainide well, she has follow-up with Dr. Nelly [name] on 3/14, consider stress testing given continued use of Flecainide. Continue flecainide 50 mg twice daily, Eliquis 5 mg twice daily given CHA2DS2-VASc of at least 4, continue diltiazem 180 mg daily, Lopressor 37.5 mg daily. Check CBC and BMET. Diltiazem refilled.   Chronic diastolic heart failure: Echo in April 2023 showed EF 60 to 65%, mildly elevated PA pressure, moderate LAE, mild MR. Today patient reports that she is doing well. She reports that she takes lasix once every two weeks. She appears euvolemic and well compensated on exam.  Continue Lasix 20 mg daily as needed. Lasix refilled.   Hypertension: Blood pressure today 130/72.  Continue diltiazem and metoprolol tartrate.  OSA: She reports that she is using her CPAP nightly, notes she sleeps significantly better since starting use. Reports she is getting between 6-8 hours of sleep a night.    Disposition: F/u with Dr.  Nelly [name] on 3/10 as scheduled and Dr. Rennis [name] in six months.   Signed, Rip Harbour, NP

## 2024-01-23 ENCOUNTER — Ambulatory Visit: Payer: No Typology Code available for payment source | Attending: Cardiology | Admitting: Cardiology

## 2024-01-23 ENCOUNTER — Ambulatory Visit: Payer: No Typology Code available for payment source | Admitting: Adult Health

## 2024-01-23 ENCOUNTER — Encounter: Payer: Self-pay | Admitting: Cardiology

## 2024-01-23 VITALS — BP 130/72 | HR 47 | Ht 63.0 in | Wt 220.0 lb

## 2024-01-23 DIAGNOSIS — G4733 Obstructive sleep apnea (adult) (pediatric): Secondary | ICD-10-CM

## 2024-01-23 DIAGNOSIS — I10 Essential (primary) hypertension: Secondary | ICD-10-CM

## 2024-01-23 DIAGNOSIS — I4819 Other persistent atrial fibrillation: Secondary | ICD-10-CM

## 2024-01-23 DIAGNOSIS — I5032 Chronic diastolic (congestive) heart failure: Secondary | ICD-10-CM | POA: Diagnosis not present

## 2024-01-23 MED ORDER — FUROSEMIDE 20 MG PO TABS: 20.0000 mg | ORAL_TABLET | Freq: Every day | ORAL | 6 refills | Status: AC | PRN

## 2024-01-23 MED ORDER — DILTIAZEM HCL ER COATED BEADS 180 MG PO CP24: 180.0000 mg | ORAL_CAPSULE | Freq: Every day | ORAL | 3 refills | Status: DC

## 2024-01-23 NOTE — Patient Instructions (Signed)
 Medication Instructions:  No changes *If you need a refill on your cardiac medications before your next appointment, please call your pharmacy*  Lab Work: Today we are going to draw CBC and BMet If you have labs (blood work) drawn today and your tests are completely normal, you will receive your results only by: MyChart Message (if you have MyChart) OR A paper copy in the mail If you have any lab test that is abnormal or we need to change your treatment, we will call you to review the results.  Testing/Procedures: No testing  Follow-Up: At [facility], you and your health needs are our priority.  As part of our continuing mission to provide you with exceptional heart care, we have created designated Provider Care Teams.  These Care Teams include your primary Cardiologist (physician) and Advanced Practice Providers (APPs -  Physician Assistants and Nurse Practitioners) who all work together to provide you with the care you need, when you need it.  We recommend signing up for the patient portal called "MyChart".  Sign up information is provided on this After Visit Summary.  MyChart is used to connect with patients for Virtual Visits (Telemedicine).  Patients are able to view lab/test results, encounter notes, upcoming appointments, etc.  Non-urgent messages can be sent to your provider as well.   To learn more about what you can do with MyChart, go to [email or web address].    Your next appointment:   6 month(s)  Provider:   Chrystie Nose, MD

## 2024-01-24 LAB — BASIC METABOLIC PANEL
BUN/Creatinine Ratio: 18 (ref 12–28)
BUN: 14 mg/dL (ref 8–27)
CO2: 23 mmol/L (ref 20–29)
Calcium: 9.2 mg/dL (ref 8.7–10.3)
Chloride: 104 mmol/L (ref 96–106)
Creatinine, Ser: 0.78 mg/dL (ref 0.57–1.00)
Glucose: 89 mg/dL (ref 70–99)
Potassium: 4.2 mmol/L (ref 3.5–5.2)
Sodium: 140 mmol/L (ref 134–144)
eGFR: 81 mL/min/{1.73_m2} (ref >59)

## 2024-01-24 LAB — CBC
Hematocrit: 39.3 % (ref 34.0–46.6)
Hemoglobin: 13.3 g/dL (ref 11.1–15.9)
MCH: 33.6 pg — ABNORMAL HIGH (ref 26.6–33.0)
MCHC: 33.8 g/dL (ref 31.5–35.7)
MCV: 99 fL — ABNORMAL HIGH (ref 79–97)
Platelets: 109 10*3/uL — ABNORMAL LOW (ref 150–450)
RBC: 3.96 x10E6/uL (ref 3.77–5.28)
RDW: 12 % (ref 11.7–15.4)
WBC: 5.2 10*3/uL (ref 3.4–10.8)

## 2024-01-28 ENCOUNTER — Telehealth: Payer: Self-pay

## 2024-01-28 NOTE — Telephone Encounter (Signed)
 Called patient advised of below they verbalized understanding.

## 2024-01-28 NOTE — Telephone Encounter (Signed)
-----   Message from Rip Harbour sent at 01/24/2024  7:55 AM EST ----- Please let [name] [name] know that her CBC shows no evidence of anemia or infection. Her kidney function is normal and her electrolytes are normal. Good results! Continue current medications and follow up as planned.

## 2024-02-03 ENCOUNTER — Ambulatory Visit: Attending: Cardiovascular Disease

## 2024-02-03 ENCOUNTER — Encounter: Payer: Self-pay | Admitting: Cardiovascular Disease

## 2024-02-03 ENCOUNTER — Ambulatory Visit
Payer: No Typology Code available for payment source | Attending: Cardiovascular Disease | Admitting: Cardiovascular Disease

## 2024-02-03 VITALS — BP 132/86 | HR 49 | Ht 63.0 in | Wt 219.0 lb

## 2024-02-03 DIAGNOSIS — I5032 Chronic diastolic (congestive) heart failure: Secondary | ICD-10-CM | POA: Diagnosis not present

## 2024-02-03 DIAGNOSIS — I4819 Other persistent atrial fibrillation: Secondary | ICD-10-CM

## 2024-02-03 NOTE — Progress Notes (Unsigned)
 Enrolled patient for a 14 day Zio XT  monitor to be mailed to patients home

## 2024-02-03 NOTE — Progress Notes (Signed)
 Electrophysiology Office Note:    Date:  02/03/2024   ID:  [name] [name], [name] [name] 09-Nov-1951, MRN [id number]  PCP:  Mirna Mires, MD   [facility] Providers Cardiologist:  Chrystie Nose, MD Electrophysiologist:  Maurice Small, MD     Referring MD: Mirna Mires, MD   History of Present Illness:    [name] [name] is a 73 y.o. female with a hx listed below, significant for hypertension and atrial fibrillation, referred for arrhythmia management.  The patient was originally diagnosed with atrial fibrillation in 2017.  She had been doing well but was incidentally found to be in atrial fibrillation during an dentist visit in 2022-04-05.  She was discharged in rate controlled atrial fibrillation but subsequently admitted with heart failure and atrial fibrillation.  She has since undergone DC cardioversions and 04-05-24 and, after additional recurrences, in November and December 2023.    She has palpitations, fatigue, shortness of breath but no chest pain, syncope, pre-syncope.  She underwent A-fib ablation on January 29, 2023.  She has done well until recently.  She ran of her flecainide and missed a dose on Friday and then had recurrence later that afternoon.  She has remained in atrial fibrillation since.  She underwent ablation in September 2024.  Since then, she has had only a few brief episodes that she has noted her heart rate increased to the 80s or 90s.  Otherwise her heart rate tends to run in the upper 40s or 50s.  She thinks these episodes of elevated heart rates may have been atrial fibrillation but she is not sure.  She has not had any dizziness, presyncope, chest pain, palpitations.  EKGs/Labs/Other Studies Reviewed Today:     TTE: 04/05/22 EF 60-65%. Moderately dilated LA  EKG:  Last EKG results: today - Atrial fibrillation   Recent Labs: 01/23/2024: BUN 14; Creatinine, Ser 0.78; Hemoglobin 13.3; Platelets 109; Potassium 4.2; Sodium 140     Physical Exam:    VS:  BP  132/86 (BP Location: Left Arm, Patient Position: Sitting, Cuff Size: Large)   Pulse (!) 49   Ht 5\' 3"  (1.6 m)   Wt 219 lb (99.3 kg)   SpO2 96%   BMI 38.79 kg/m     Wt Readings from Last 3 Encounters:  02/03/24 219 lb (99.3 kg)  01/23/24 220 lb (99.8 kg)  11/05/23 218 lb 6.4 oz (99.1 kg)     GEN: Well nourished, well developed in no acute distress CARDIAC: iRRR, no murmurs, rubs, gallops RESPIRATORY:  Normal work of breathing MUSCULOSKELETAL: no edema    ASSESSMENT & PLAN:    Atrial fibrillation, persistent: symptomatic, failed flecainide.  Status post atrial fibrillation ablation January 29, 2023; she required DC cardioversion during the ablation. AF recurred after missing flecainide S/p ablation again 07/2023 and appears to be maintaining sinus rhythm fairly well Place 14-day monitor to assess heart rate variation Continue metoprolol Continue apixaban 5 mg twice daily  Bradycardia Discontinue diltiazem Continue metoprolol 0.5 mg p.o. twice daily  Secondary hyercoagulable state: continue eliquis  Obesity: I informed her that her risks of maintaining sinus rhythm will be significantly higher if she loses weight. I encouraged her to monitor her caloric intake and eat a higher proportion for vegetables and fruits and avoid fats and refined carbohydrates.  Sleep apnea       Medication Adjustments/Labs and Tests Ordered: Current medicines are reviewed at length with the patient today.  Concerns regarding medicines are outlined above.  Orders  Placed This Encounter  Procedures   EKG 12-Lead   No orders of the defined types were placed in this encounter.    Signed, Maurice Small, MD  02/03/2024 1:02 PM    [facility]

## 2024-02-03 NOTE — Patient Instructions (Addendum)
 Medication Instructions:  STOP Diltiazem  *If you need a refill on your cardiac medications before your next appointment, please call your pharmacy*   Testing/Procedures: Zio Cardiac Event Monitor - this will be mailed to you via USPS Your physician has recommended that you wear an event monitor. Event monitors are medical devices that record the heart's electrical activity. Doctors most often [place] these monitors to diagnose arrhythmias. Arrhythmias are problems with the speed or rhythm of the heartbeat. The monitor is a small, portable device. You can wear one while you do your normal daily activities. This is usually used to diagnose what is causing palpitations/syncope (passing out).   Follow-Up: At [facility], you and your health needs are our priority.  As part of our continuing mission to provide you with exceptional heart care, we have created designated Provider Care Teams.  These Care Teams include your primary Cardiologist (physician) and Advanced Practice Providers (APPs -  Physician Assistants and Nurse Practitioners) who all work together to provide you with the care you need, when you need it.  We recommend signing up for the patient portal called "MyChart".  Sign up information is provided on this After Visit Summary.  MyChart is used to connect with patients for Virtual Visits (Telemedicine).  Patients are able to view lab/test results, encounter notes, upcoming appointments, etc.  Non-urgent messages can be sent to your provider as well.   To learn more about what you can do with MyChart, go to [email or web address].    Your next appointment:   6 week(s)  Provider:   York Pellant, MD     Christena Deem- Long Term Monitor Instructions  Your physician has requested you wear a ZIO patch monitor for 14 days.  This is a single patch monitor. [employer] supplies one patch monitor per enrollment. Additional stickers are not available. Please do not apply patch if you will be  having a Nuclear Stress Test,  Echocardiogram, Cardiac CT, MRI, or Chest Xray during the period you would be wearing the  monitor. The patch cannot be worn during these tests. You cannot remove and re-apply the  ZIO XT patch monitor.  Your ZIO patch monitor will be mailed 3 day USPS to your address on file. It may take 3-5 days  to receive your monitor after you have been enrolled.  Once you have received your monitor, please review the enclosed instructions. Your monitor  has already been registered assigning a specific monitor serial # to you.  Billing and Patient Assistance Program Information  We have supplied [employer] with any of your insurance information on file for billing purposes. [employer] offers a sliding scale Patient Assistance Program for patients that do not have  insurance, or whose insurance does not completely cover the cost of the ZIO monitor.  You must apply for the Patient Assistance Program to qualify for this discounted rate.  To apply, please call [employer] at [phone], select option 4, select option 2, ask to apply for  Patient Assistance Program. Meredeth Ide will ask your household income, and how many people  are in your household. They will quote your out-of-pocket cost based on that information.  [employer] will also be able to set up a 55-month, interest-free payment plan if needed.  Applying the monitor   Shave hair from upper left chest.  Hold abrader disc by orange tab. Rub abrader in 40 strokes over the upper left chest as  indicated in your monitor instructions.  Clean area with 4 enclosed alcohol pads.  Let dry.  Apply patch as indicated in monitor instructions. Patch will be placed under collarbone on left  side of chest with arrow pointing upward.  Rub patch adhesive wings for 2 minutes. Remove white label marked "1". Remove the white  label marked "2". Rub patch adhesive wings for 2 additional minutes.  While looking in a mirror, press and release button  in center of patch. A small green light will  flash 3-4 times. This will be your only indicator that the monitor has been turned on.  Do not shower for the first 24 hours. You may shower after the first 24 hours.  Press the button if you feel a symptom. You will hear a small click. Record Date, Time and  Symptom in the Patient Logbook.  When you are ready to remove the patch, follow instructions on the last 2 pages of Patient  Logbook. Stick patch monitor onto the last page of Patient Logbook.  Place Patient Logbook in the blue and white box. Use locking tab on box and tape box closed  securely. The blue and white box has prepaid postage on it. Please place it in the mailbox as  soon as possible. Your physician should have your test results approximately 7 days after the  monitor has been mailed back to [facility].  Call [facility] Customer Care at [phone] if you have questions regarding  your ZIO XT patch monitor. Call them immediately if you see an orange light blinking on your  monitor.  If your monitor falls off in less than 4 days, contact our Monitor department at [phone].  If your monitor becomes loose or falls off after 4 days call [employer] at [phone] for  suggestions on securing your monitor

## 2024-02-09 DIAGNOSIS — I10 Essential (primary) hypertension: Secondary | ICD-10-CM | POA: Diagnosis not present

## 2024-02-09 DIAGNOSIS — G4733 Obstructive sleep apnea (adult) (pediatric): Secondary | ICD-10-CM | POA: Diagnosis not present

## 2024-02-09 DIAGNOSIS — I5032 Chronic diastolic (congestive) heart failure: Secondary | ICD-10-CM | POA: Diagnosis not present

## 2024-02-10 DIAGNOSIS — E663 Overweight: Secondary | ICD-10-CM | POA: Diagnosis not present

## 2024-02-10 DIAGNOSIS — F4321 Adjustment disorder with depressed mood: Secondary | ICD-10-CM | POA: Diagnosis not present

## 2024-02-10 DIAGNOSIS — R001 Bradycardia, unspecified: Secondary | ICD-10-CM | POA: Diagnosis not present

## 2024-02-10 DIAGNOSIS — I480 Paroxysmal atrial fibrillation: Secondary | ICD-10-CM | POA: Diagnosis not present

## 2024-02-10 DIAGNOSIS — Z7901 Long term (current) use of anticoagulants: Secondary | ICD-10-CM | POA: Diagnosis not present

## 2024-02-10 DIAGNOSIS — I10 Essential (primary) hypertension: Secondary | ICD-10-CM | POA: Diagnosis not present

## 2024-02-17 DIAGNOSIS — R001 Bradycardia, unspecified: Secondary | ICD-10-CM | POA: Diagnosis not present

## 2024-02-17 DIAGNOSIS — I480 Paroxysmal atrial fibrillation: Secondary | ICD-10-CM | POA: Diagnosis not present

## 2024-02-17 DIAGNOSIS — I10 Essential (primary) hypertension: Secondary | ICD-10-CM | POA: Diagnosis not present

## 2024-02-29 DIAGNOSIS — I5032 Chronic diastolic (congestive) heart failure: Secondary | ICD-10-CM | POA: Diagnosis not present

## 2024-02-29 DIAGNOSIS — I4819 Other persistent atrial fibrillation: Secondary | ICD-10-CM | POA: Diagnosis not present

## 2024-03-08 DIAGNOSIS — I4819 Other persistent atrial fibrillation: Secondary | ICD-10-CM | POA: Diagnosis not present

## 2024-03-08 DIAGNOSIS — I5032 Chronic diastolic (congestive) heart failure: Secondary | ICD-10-CM | POA: Diagnosis not present

## 2024-03-11 DIAGNOSIS — I10 Essential (primary) hypertension: Secondary | ICD-10-CM | POA: Diagnosis not present

## 2024-03-11 DIAGNOSIS — G4733 Obstructive sleep apnea (adult) (pediatric): Secondary | ICD-10-CM | POA: Diagnosis not present

## 2024-03-11 DIAGNOSIS — I5032 Chronic diastolic (congestive) heart failure: Secondary | ICD-10-CM | POA: Diagnosis not present

## 2024-03-16 ENCOUNTER — Encounter: Payer: Self-pay | Admitting: Cardiovascular Disease

## 2024-03-16 ENCOUNTER — Ambulatory Visit: Attending: Cardiovascular Disease | Admitting: Cardiovascular Disease

## 2024-03-16 VITALS — BP 146/86 | HR 56 | Ht 63.0 in | Wt 221.8 lb

## 2024-03-16 DIAGNOSIS — I4819 Other persistent atrial fibrillation: Secondary | ICD-10-CM

## 2024-03-16 NOTE — Progress Notes (Signed)
 Electrophysiology Office Note:    Date:  03/16/2024   ID:  [name] [name], [name] [name], MRN [id number]  PCP:  [name] Handler, MD   [facility] Providers Cardiologist:  Hazle Lites, MD Electrophysiologist:  Efraim Grange, MD     Referring MD: [name] Handler, MD   History of Present Illness:    [name] [name] is a 73 y.o. female with a hx listed below, significant for hypertension and atrial fibrillation, referred for arrhythmia management.  The patient was originally diagnosed with atrial fibrillation in 2017.  She had been doing well but was incidentally found to be in atrial fibrillation during an dentist visit in 03/08/2022.  She was discharged in rate controlled atrial fibrillation but subsequently admitted with heart failure and atrial fibrillation.  She has since undergone DC cardioversions and 2024-03-08 and, after additional recurrences, in November and December 2023.    She has palpitations, fatigue, shortness of breath but no chest pain, syncope, pre-syncope.  She underwent A-fib ablation on January 29, 2023.  She has done well until recently.  She ran of her flecainide  and missed a dose on Friday and then had recurrence later that afternoon.  She has remained in atrial fibrillation since.  She underwent ablation in September 2024.  Since then, she has had only a few brief episodes that she has noted her heart rate increased to the 80s or 90s.  Otherwise her heart rate tends to run in the upper 40s or 50s.  She thinks these episodes of elevated heart rates may have been atrial fibrillation but she is not sure.  She has not had any dizziness, presyncope, chest pain, palpitations.  EKGs/Labs/Other Studies Reviewed Today:     TTE: Mar 08, 2022 EF 60-65%. Moderately dilated LA  EKG:  Last EKG results: today - Atrial fibrillation  Monitor 08-Mar-2024 14 day monitor   Sinus rhythm HR 38-79 bpm, avg 50 bpm  264 episodes of SVT, HR 73-185, avg 113 bpm Symptoms episodes correlated with  ectopy, often in runs. Some episodes correlated with sinus rhythm.  Recent Labs: 01/23/2024: BUN 14; Creatinine, Ser 0.78; Hemoglobin 13.3; Platelets 109; Potassium 4.2; Sodium 140     Physical Exam:    VS:  BP (!) 146/86 (BP Location: Left Arm, Patient Position: Sitting, Cuff Size: Large)   Pulse (!) 56   Ht 5\' 3"  (1.6 m)   Wt 221 lb 12.8 oz (100.6 kg)   SpO2 95%   BMI 39.29 kg/m     Wt Readings from Last 3 Encounters:  03/16/24 221 lb 12.8 oz (100.6 kg)  02/03/24 219 lb (99.3 kg)  01/23/24 220 lb (99.8 kg)     GEN: Well nourished, well developed in no acute distress CARDIAC: iRRR, no murmurs, rubs, gallops RESPIRATORY:  Normal work of breathing MUSCULOSKELETAL: no edema    ASSESSMENT & PLAN:    Atrial fibrillation, persistent: symptomatic, failed flecainide .  Status post atrial fibrillation ablation January 29, 2023; she required DC cardioversion during the ablation. AF recurred after missing flecainide  S/p ablation again 07/2023 and appears to be relatively free of atrial fibrillation, though she does have episodes of SVT Continue metoprolol  Continue apixaban  5 mg twice daily  Bradycardia Persistently bradycardic despite discontinuing She needs additional rate control medication She is symptomatic with exertional fatigue and poor energy Continue metoprolol  0.5 mg p.o. twice daily Sinus heart rates are slow, max of 79 bpm while wearing the monitor. At this point, after multiple ablations with moderately well-controlled arrhythmia on  flecainide  and arguably need for additional medication, she has an indication for pacemaker placement due to sinus bradycardia and chronotropic incompetence.  I discussed the indication for the procedure and the logistics, risks, potential benefit, and after care. I specifically explained that risks include but are not limited to infection, bleeding,damage to blood vessels, lung, and the heart -- but risk of prolonged hospitalization, need for  surgery, or the event of stroke, heart attack, or death are low but not zero.    Secondary hyercoagulable state: continue eliquis   Obesity: I informed her that her risks of maintaining sinus rhythm will be significantly higher if she loses weight. I encouraged her to monitor her caloric intake and eat a higher proportion for vegetables and fruits and avoid fats and refined carbohydrates.  Sleep apnea       Medication Adjustments/Labs and Tests Ordered: Current medicines are reviewed at length with the patient today.  Concerns regarding medicines are outlined above.  Orders Placed This Encounter  Procedures   EKG 12-Lead   No orders of the defined types were placed in this encounter.    Signed, Efraim Grange, MD  03/16/2024 12:42 PM    [facility]

## 2024-03-16 NOTE — Patient Instructions (Addendum)
 Medication Instructions:  Your physician recommends that you continue on your current medications as directed. Please refer to the Current Medication list given to you today. *If you need a refill on your cardiac medications before your next appointment, please call your pharmacy*  Lab Work: CBC and BMET - please have pre-procedure lab work completed at [facility] near you on Monday, May 5 (or anytime that week works as well) If you have labs (blood work) drawn today and your tests are completely normal, you will receive your results only by: [employer] (if you have MyChart) OR A paper copy in the mail If you have any lab test that is abnormal or we need to change your treatment, we will call you to review the results.  Testing/Procedures: Dual Chamber Pacemaker - scheduled for Friday, May 30 - we will be in contact with you closer to time with further instructions Your physician has recommended that you have a pacemaker inserted. A pacemaker is a small device that is placed under the skin of your chest or abdomen to help control abnormal heart rhythms. This device uses electrical pulses to prompt the heart to beat at a normal rate. Pacemakers are used to treat heart rhythms that are too slow. Wire (leads) are attached to the pacemaker that goes into the chambers of you heart. This is done in the hospital and usually requires and overnight stay. Please see the instruction sheet given to you today for more information.  Follow-Up: At [facility], you and your health needs are our priority.  As part of our continuing mission to provide you with exceptional heart care, our providers are all part of one team.  This team includes your primary Cardiologist (physician) and Advanced Practice Providers or APPs (Physician Assistants and Nurse Practitioners) who all work together to provide you with the care you need, when you need it.  Your next appointment:   We will schedule follow up  after your implant  Provider:   Marlane Silver, MD      1st Floor: - Lobby - Registration  - Pharmacy  - Lab - Cafe  2nd Floor: - PV Lab - Diagnostic Testing (echo, CT, nuclear med)  3rd Floor: - Vacant  4th Floor: - TCTS (cardiothoracic surgery) - AFib Clinic - Structural Heart Clinic - Vascular Surgery  - Vascular Ultrasound  5th Floor: - HeartCare Cardiology (general and EP) - Clinical Pharmacy for coumadin, hypertension, lipid, weight-loss medications, and med management appointments    Valet parking services will be available as well.

## 2024-03-17 DIAGNOSIS — H8109 Meniere's disease, unspecified ear: Secondary | ICD-10-CM | POA: Diagnosis not present

## 2024-03-17 DIAGNOSIS — R001 Bradycardia, unspecified: Secondary | ICD-10-CM | POA: Diagnosis not present

## 2024-03-17 DIAGNOSIS — I480 Paroxysmal atrial fibrillation: Secondary | ICD-10-CM | POA: Diagnosis not present

## 2024-03-17 DIAGNOSIS — I10 Essential (primary) hypertension: Secondary | ICD-10-CM | POA: Diagnosis not present

## 2024-03-21 ENCOUNTER — Encounter (HOSPITAL_COMMUNITY): Payer: Self-pay

## 2024-03-21 ENCOUNTER — Other Ambulatory Visit: Payer: Self-pay

## 2024-03-21 ENCOUNTER — Emergency Department (HOSPITAL_COMMUNITY)
Admission: EM | Admit: 2024-03-21 | Discharge: 2024-03-21 | Disposition: A | Discharge status: Home / Self Care | Attending: Emergency Medicine | Admitting: Emergency Medicine

## 2024-03-21 DIAGNOSIS — Z7901 Long term (current) use of anticoagulants: Secondary | ICD-10-CM | POA: Diagnosis not present

## 2024-03-21 DIAGNOSIS — I4891 Unspecified atrial fibrillation: Secondary | ICD-10-CM | POA: Diagnosis not present

## 2024-03-21 DIAGNOSIS — R000 Tachycardia, unspecified: Secondary | ICD-10-CM | POA: Diagnosis present

## 2024-03-21 LAB — CBC WITH DIFFERENTIAL/PLATELET
Abs Immature Granulocytes: 0.01 10*3/uL (ref 0.00–0.07)
Basophils Absolute: 0 10*3/uL (ref 0.0–0.1)
Basophils Relative: 1 %
Eosinophils Absolute: 0.1 10*3/uL (ref 0.0–0.5)
Eosinophils Relative: 2 %
HCT: 44.1 % (ref 36.0–46.0)
Hemoglobin: 14.7 g/dL (ref 12.0–15.0)
Immature Granulocytes: 0 %
Lymphocytes Relative: 39 %
Lymphs Abs: 2.4 10*3/uL (ref 0.7–4.0)
MCH: 33.2 pg (ref 26.0–34.0)
MCHC: 33.3 g/dL (ref 30.0–36.0)
MCV: 99.5 fL (ref 80.0–100.0)
Monocytes Absolute: 0.6 10*3/uL (ref 0.1–1.0)
Monocytes Relative: 10 %
Neutro Abs: 3 10*3/uL (ref 1.7–7.7)
Neutrophils Relative %: 48 %
Platelets: 174 10*3/uL (ref 150–400)
RBC: 4.43 MIL/uL (ref 3.87–5.11)
RDW: 12.7 % (ref 11.5–15.5)
WBC: 6.1 10*3/uL (ref 4.0–10.5)
nRBC: 0 % (ref 0.0–0.2)

## 2024-03-21 LAB — BASIC METABOLIC PANEL WITH GFR
Anion gap: 8 (ref 5–15)
BUN: 14 mg/dL (ref 8–23)
CO2: 25 mmol/L (ref 22–32)
Calcium: 9.1 mg/dL (ref 8.9–10.3)
Chloride: 105 mmol/L (ref 98–111)
Creatinine, Ser: 0.75 mg/dL (ref 0.44–1.00)
GFR, Estimated: >60 mL/min (ref >60)
Glucose, Bld: 145 mg/dL — ABNORMAL HIGH (ref 70–99)
Potassium: 4.3 mmol/L (ref 3.5–5.1)
Sodium: 138 mmol/L (ref 135–145)

## 2024-03-21 MED ORDER — METOPROLOL TARTRATE 5 MG/5ML IV SOLN
5.0000 mg | Freq: Once | INTRAVENOUS | Status: AC
  Administered 2024-03-21: 5 mg via INTRAVENOUS
  Filled 2024-03-21: qty 5

## 2024-03-21 NOTE — Discharge Instructions (Addendum)
 Call the cardiologist on Monday to schedule follow-up visit

## 2024-03-21 NOTE — ED Triage Notes (Signed)
 Patient was reading a book at 12pm and then felt her heart fluttering through her chest. Unable to catch her breath during triage. Has a fib and has multiple cardioversions.

## 2024-03-21 NOTE — ED Provider Notes (Signed)
 [place] EMERGENCY DEPARTMENT AT [facility] Provider Note   CSN: [id number] Arrival date & time: 03/21/24  1321     History  Chief Complaint  Patient presents with   Tachycardia    [name] [name] is a 73 y.o. female.  73 year old female presents with acute onset of palpitations which began just prior to arrival.  Patient has history of A-fib and after review of her old chart shows that she is scheduled to have a pacemaker placed.  This information is present in the medical record from a recent cardiology visit 2 days ago.  She denies any chest pain or shortness of breath.  No syncope or near syncope.  Notes that she has been compliant with her Eliquis .  States that she took her normal morning beta-blocker as well as flecainide .       Home Medications Prior to Admission medications   Medication Sig Start Date End Date Taking? Authorizing Provider  acetaminophen  (TYLENOL ) 500 MG tablet Take 1,000 mg by mouth every 6 (six) hours as needed for mild pain or headache.    [provider]  apixaban  (ELIQUIS ) 5 MG TABS tablet Take 1 tablet (5 mg total) by mouth 2 (two) times daily. 11/25/23   HiltyAviva Lemmings, MD  APPLE CIDER VINEGAR PO Take 450 mg by mouth every morning.    [provider]  busPIRone  (BUSPAR ) 5 MG tablet Take 10 mg by mouth at bedtime.    [provider]  Cholecalciferol  (VITAMIN D ) 50 MCG (2000 UT) tablet Take 2,000 Units by mouth daily.    [provider]  flecainide  (TAMBOCOR ) 50 MG tablet Take 1 tablet (50 mg total) by mouth 2 (two) times daily. 08/02/23   Mealor, Augustus E, MD  fluticasone (FLONASE) 50 MCG/ACT nasal spray Place 2 sprays into both nostrils daily as needed for allergies.    [provider]  furosemide  (LASIX ) 20 MG tablet Take 1 tablet (20 mg total) by mouth daily as needed. 01/23/24   West, Katlyn D, NP  losartan  (COZAAR ) 50 MG tablet Take 50 mg by mouth daily. 02/11/24   [provider]   Magnesium 400 MG CAPS Take 400 mg by mouth every morning.    [provider]  metoprolol  tartrate (LOPRESSOR ) 25 MG tablet Take 1.5 tablets (37.5 mg total) by mouth 2 (two) times daily. 01/08/24   Hazle Lites, MD  Multiple Vitamin (MULTIVITAMIN WITH MINERALS) TABS tablet Take 1 tablet by mouth daily.    [provider]  NON FORMULARY Pt uses a cpap nightly    [provider]  Potassium 99 MG TABS Take 99 mg by mouth daily.    [provider]  vitamin k 100 MCG tablet Take 100 mcg by mouth daily.    [provider]      Allergies    Patient has no known allergies.    Review of Systems   Review of Systems  All other systems reviewed and are negative.   Physical Exam Updated Vital Signs BP (!) 160/114   Pulse (!) 148   Temp 98.3 F (36.8 C) (Oral)   Resp (!) 25   Ht 1.6 m (5\' 3" )   Wt 99.8 kg   SpO2 94%   BMI 38.97 kg/m  Physical Exam Vitals and nursing note reviewed.  Constitutional:      General: She is not in acute distress.    Appearance: Normal appearance. She is well-developed. She is not toxic-appearing.  HENT:  Head: Normocephalic and atraumatic.  Eyes:     General: Lids are normal.     Conjunctiva/sclera: Conjunctivae normal.     Pupils: Pupils are equal, round, and reactive to light.  Neck:     Thyroid : No thyroid  mass.     Trachea: No tracheal deviation.  Cardiovascular:     Rate and Rhythm: Tachycardia present. Rhythm irregular.     Heart sounds: Normal heart sounds. No murmur heard.    No gallop.  Pulmonary:     Effort: Pulmonary effort is normal. No respiratory distress.     Breath sounds: Normal breath sounds. No stridor. No decreased breath sounds, wheezing, rhonchi or rales.  Abdominal:     General: There is no distension.     Palpations: Abdomen is soft.     Tenderness: There is no abdominal tenderness. There is no rebound.  Musculoskeletal:        General: No tenderness. Normal range of motion.      Cervical back: Normal range of motion and neck supple.  Skin:    General: Skin is warm and dry.     Findings: No abrasion or rash.  Neurological:     Mental Status: She is alert and oriented to person, place, and time. Mental status is at baseline.     GCS: GCS eye subscore is 4. GCS verbal subscore is 5. GCS motor subscore is 6.     Cranial Nerves: No cranial nerve deficit.     Sensory: No sensory deficit.     Motor: Motor function is intact.  Psychiatric:        Attention and Perception: Attention normal.        Speech: Speech normal.        Behavior: Behavior normal.     ED Results / Procedures / Treatments   Labs (all labs ordered are listed, but only abnormal results are displayed) Labs Reviewed  CBC WITH DIFFERENTIAL/PLATELET  BASIC METABOLIC PANEL WITH GFR    EKG EKG Interpretation Date/Time:  Saturday March 21 2024 14:15:39 EDT Ventricular Rate:  83 PR Interval:  293 QRS Duration:  112 QT Interval:  402 QTC Calculation: 473 R Axis:   -46  Text Interpretation: Sinus or ectopic atrial rhythm Ventricular premature complex Prolonged PR interval Incomplete right bundle branch block ST elevation, consider inferior injury Confirmed by Lind Repine ([id number]) on 03/21/2024 3:02:58 PM  Radiology No results found.  Procedures Procedures    Medications Ordered in ED Medications  metoprolol  tartrate (LOPRESSOR ) injection 5 mg (has no administration in time range)    ED Course/ Medical Decision Making/ A&P                                 Medical Decision Making Amount and/or Complexity of Data Reviewed Labs: ordered. ECG/medicine tests: ordered.  Risk Prescription drug management.   Patient is initial EKG shows A-fib.  Patient given 5 mg of Lopressor  and repeat EKG shows rate to be better controlled and what appears to be sinus rhythm.  Case discussed with cardiologist on-call, Dr. Cristela Donald, who reviewed the patient is EKG and feels that patient can continue  taking her Eliquis  as well as her current dose of beta-blocker and follow-up in the office.  Family at bedside and informed of this plan and are agreeable  CRITICAL CARE Performed by: Eden Goodpasture Total critical care time: 45 minutes Critical care time was exclusive of separately billable  procedures and treating other patients. Critical care was necessary to treat or prevent imminent or life-threatening deterioration. Critical care was time spent personally by me on the following activities: development of treatment plan with patient and/or surrogate as well as nursing, discussions with consultants, evaluation of patient's response to treatment, examination of patient, obtaining history from patient or surrogate, ordering and performing treatments and interventions, ordering and review of laboratory studies, ordering and review of radiographic studies, pulse oximetry and re-evaluation of patient's condition.        Final Clinical Impression(s) / ED Diagnoses Final diagnoses:  None    Rx / DC Orders ED Discharge Orders     None         Lind Repine, MD 03/21/24 [phone]

## 2024-03-30 DIAGNOSIS — I4819 Other persistent atrial fibrillation: Secondary | ICD-10-CM | POA: Diagnosis not present

## 2024-03-31 ENCOUNTER — Telehealth (HOSPITAL_COMMUNITY): Payer: Self-pay

## 2024-03-31 LAB — CBC
Hematocrit: 42.1 % (ref 34.0–46.6)
Hemoglobin: 14.2 g/dL (ref 11.1–15.9)
MCH: 34.1 pg — ABNORMAL HIGH (ref 26.6–33.0)
MCHC: 33.7 g/dL (ref 31.5–35.7)
MCV: 101 fL — ABNORMAL HIGH (ref 79–97)
Platelets: 115 10*3/uL — ABNORMAL LOW (ref 150–450)
RBC: 4.17 x10E6/uL (ref 3.77–5.28)
RDW: 12.3 % (ref 11.7–15.4)
WBC: 4.6 10*3/uL (ref 3.4–10.8)

## 2024-03-31 LAB — BASIC METABOLIC PANEL WITH GFR
BUN/Creatinine Ratio: 9 — ABNORMAL LOW (ref 12–28)
BUN: 6 mg/dL — ABNORMAL LOW (ref 8–27)
CO2: 24 mmol/L (ref 20–29)
Calcium: 9.3 mg/dL (ref 8.7–10.3)
Chloride: 102 mmol/L (ref 96–106)
Creatinine, Ser: 0.65 mg/dL (ref 0.57–1.00)
Glucose: 103 mg/dL — ABNORMAL HIGH (ref 70–99)
Potassium: 4 mmol/L (ref 3.5–5.2)
Sodium: 140 mmol/L (ref 134–144)
eGFR: 93 mL/min/{1.73_m2} (ref >59)

## 2024-03-31 NOTE — Telephone Encounter (Signed)
 Spoke with patient to complete pre-procedure call.     New medical conditions? No Recent hospitalizations or surgeries? ED 4/26- for Afib Started any new medications? No Patient made aware to contact office to inform of any new medications started. Any changes in activities of daily living? No  Pre-procedure testing scheduled: lab work ordered  Confirmed patient is scheduled for  Permanent transvenous pacemaker (PPM) on Friday, May 30 with Dr. Marlane Silver. Instructed patient to arrive at the Main Entrance A at [facility]: [address] [place], [place] [address] and check in at Admitting at 1:00 PM.  Advised of plan to go home the same day and will only stay overnight if medically necessary. You MUST have a responsible adult to drive you home and MUST be with you the first 24 hours after you arrive home or your procedure could be cancelled.  Patient verbalized understanding to information provided and is agreeable to proceed with procedure.

## 2024-04-06 ENCOUNTER — Encounter: Payer: Self-pay | Admitting: Cardiovascular Disease

## 2024-04-10 DIAGNOSIS — I10 Essential (primary) hypertension: Secondary | ICD-10-CM | POA: Diagnosis not present

## 2024-04-10 DIAGNOSIS — G4733 Obstructive sleep apnea (adult) (pediatric): Secondary | ICD-10-CM | POA: Diagnosis not present

## 2024-04-10 DIAGNOSIS — I5032 Chronic diastolic (congestive) heart failure: Secondary | ICD-10-CM | POA: Diagnosis not present

## 2024-04-21 ENCOUNTER — Telehealth (HOSPITAL_COMMUNITY): Payer: Self-pay

## 2024-04-21 NOTE — Telephone Encounter (Signed)
 Spoke with patient to discuss upcoming procedure.   Confirmed patient is scheduled for a Permanent transvenous pacemaker (PPM) on Friday, May 30 with Dr. Marlane Silver. Instructed patient to arrive at the Main Entrance A at [facility]: [address] [place], [place] [address] and check in at Admitting at 1:00 PM.   Labs completed  Any recent signs of acute illness or been started on antibiotics? No Any new medications started? No Any medications to hold? Hold Eliquis  for 2 days prior to procedure- last dose on today, 5/27 and hold Furosemide  the morning of procedure.   Medication instructions:  On the morning of your procedure you may take all other medications not discussed with a small sip of water.  No eating or drinking after midnight prior to procedure.   The night before your procedure and the morning of your procedure, wash thoroughly with the CHG surgical soap from the neck down, paying special attention to the area where your procedure will be performed.  Advised of plan to go home the same day and will only stay overnight if medically necessary. You MUST have a responsible adult to drive you home and MUST be with you the first 24 hours after you arrive home.  Patient verbalized understanding to all instructions provided and agreed to proceed with procedure.

## 2024-04-23 NOTE — Pre-Procedure Instructions (Signed)
 Instructed patient on the following items: Arrival time 1230 Nothing to eat or drink after midnight No meds AM of procedure Responsible person to drive you home and stay with you for 24 hrs Wash with special soap night before and morning of procedure If on anti-coagulant drug instructions Eliquis - last dose 5/27

## 2024-04-24 ENCOUNTER — Ambulatory Visit (HOSPITAL_COMMUNITY)
Admission: RE | Admit: 2024-04-24 | Discharge: 2024-04-25 | Disposition: A | Discharge status: Home / Self Care | Attending: Cardiovascular Disease | Admitting: Cardiovascular Disease

## 2024-04-24 ENCOUNTER — Encounter (HOSPITAL_COMMUNITY)
Admission: RE | Disposition: A | Discharge status: Home / Self Care | Payer: Self-pay | Source: Home / Self Care | Attending: Cardiovascular Disease

## 2024-04-24 ENCOUNTER — Ambulatory Visit (HOSPITAL_COMMUNITY)

## 2024-04-24 ENCOUNTER — Encounter (HOSPITAL_COMMUNITY): Payer: Self-pay | Admitting: Cardiovascular Disease

## 2024-04-24 ENCOUNTER — Ambulatory Visit (HOSPITAL_COMMUNITY)
Admission: RE | Disposition: A | Discharge status: Home / Self Care | Payer: Self-pay | Source: Home / Self Care | Attending: Cardiovascular Disease

## 2024-04-24 ENCOUNTER — Other Ambulatory Visit: Payer: Self-pay

## 2024-04-24 DIAGNOSIS — Z950 Presence of cardiac pacemaker: Secondary | ICD-10-CM | POA: Diagnosis present

## 2024-04-24 DIAGNOSIS — M96841 Postprocedural hematoma of a musculoskeletal structure following other procedure: Secondary | ICD-10-CM | POA: Diagnosis not present

## 2024-04-24 DIAGNOSIS — D6869 Other thrombophilia: Secondary | ICD-10-CM | POA: Diagnosis not present

## 2024-04-24 DIAGNOSIS — Y839 Surgical procedure, unspecified as the cause of abnormal reaction of the patient, or of later complication, without mention of misadventure at the time of the procedure: Secondary | ICD-10-CM | POA: Insufficient documentation

## 2024-04-24 DIAGNOSIS — I495 Sick sinus syndrome: Secondary | ICD-10-CM

## 2024-04-24 DIAGNOSIS — G4733 Obstructive sleep apnea (adult) (pediatric): Secondary | ICD-10-CM | POA: Insufficient documentation

## 2024-04-24 DIAGNOSIS — I4819 Other persistent atrial fibrillation: Secondary | ICD-10-CM | POA: Insufficient documentation

## 2024-04-24 DIAGNOSIS — I471 Supraventricular tachycardia, unspecified: Secondary | ICD-10-CM | POA: Diagnosis not present

## 2024-04-24 DIAGNOSIS — Z6838 Body mass index (BMI) 38.0-38.9, adult: Secondary | ICD-10-CM | POA: Insufficient documentation

## 2024-04-24 DIAGNOSIS — T82120A Displacement of cardiac electrode, initial encounter: Secondary | ICD-10-CM

## 2024-04-24 DIAGNOSIS — E669 Obesity, unspecified: Secondary | ICD-10-CM | POA: Insufficient documentation

## 2024-04-24 DIAGNOSIS — I5032 Chronic diastolic (congestive) heart failure: Secondary | ICD-10-CM | POA: Insufficient documentation

## 2024-04-24 DIAGNOSIS — Z79899 Other long term (current) drug therapy: Secondary | ICD-10-CM | POA: Insufficient documentation

## 2024-04-24 DIAGNOSIS — I110 Hypertensive heart disease with heart failure: Secondary | ICD-10-CM | POA: Diagnosis not present

## 2024-04-24 DIAGNOSIS — Z7901 Long term (current) use of anticoagulants: Secondary | ICD-10-CM | POA: Insufficient documentation

## 2024-04-24 DIAGNOSIS — R001 Bradycardia, unspecified: Secondary | ICD-10-CM | POA: Diagnosis not present

## 2024-04-24 HISTORY — PX: PACEMAKER IMPLANT: EP1218

## 2024-04-24 HISTORY — PX: LEAD REVISION/REPAIR: EP1213

## 2024-04-24 SURGERY — LEAD REVISION/REPAIR
Anesthesia: LOCAL

## 2024-04-24 SURGERY — PACEMAKER IMPLANT

## 2024-04-24 MED ORDER — MIDAZOLAM HCL 5 MG/5ML IJ SOLN
INTRAMUSCULAR | Status: DC | PRN
  Administered 2024-04-24 (×2): 1 mg via INTRAVENOUS

## 2024-04-24 MED ORDER — CEFAZOLIN SODIUM-DEXTROSE 2-3 GM-%(50ML) IV SOLR
INTRAVENOUS | Status: DC | PRN
  Administered 2024-04-24: 2 g via INTRAVENOUS

## 2024-04-24 MED ORDER — CEFAZOLIN SODIUM-DEXTROSE 1-4 GM/50ML-% IV SOLN
1.0000 g | Freq: Four times a day (QID) | INTRAVENOUS | Status: AC
  Administered 2024-04-25 (×3): 1 g via INTRAVENOUS
  Filled 2024-04-24 (×3): qty 50

## 2024-04-24 MED ORDER — BUSPIRONE HCL 5 MG PO TABS
5.0000 mg | ORAL_TABLET | Freq: Two times a day (BID) | ORAL | Status: DC
  Administered 2024-04-24 – 2024-04-25 (×2): 5 mg via ORAL
  Filled 2024-04-24 (×2): qty 1

## 2024-04-24 MED ORDER — MIDAZOLAM HCL 2 MG/2ML IJ SOLN
INTRAMUSCULAR | Status: AC
  Filled 2024-04-24: qty 2

## 2024-04-24 MED ORDER — LIDOCAINE HCL 1 % IJ SOLN
INTRAMUSCULAR | Status: AC
Start: 2024-04-24 — End: ?
  Filled 2024-04-24: qty 40

## 2024-04-24 MED ORDER — ACETAMINOPHEN 325 MG PO TABS
325.0000 mg | ORAL_TABLET | ORAL | Status: DC | PRN
  Administered 2024-04-25: 650 mg via ORAL
  Filled 2024-04-24: qty 2

## 2024-04-24 MED ORDER — SODIUM CHLORIDE 0.9 % IV SOLN: INTRAVENOUS | Status: DC

## 2024-04-24 MED ORDER — CHLORHEXIDINE GLUCONATE 4 % EX SOLN
4.0000 | Freq: Once | CUTANEOUS | Status: DC
  Filled 2024-04-24: qty 60

## 2024-04-24 MED ORDER — CEFAZOLIN SODIUM-DEXTROSE 2-4 GM/100ML-% IV SOLN
INTRAVENOUS | Status: AC
  Filled 2024-04-24: qty 100

## 2024-04-24 MED ORDER — LIDOCAINE HCL (PF) 1 % IJ SOLN
INTRAMUSCULAR | Status: DC | PRN
  Administered 2024-04-24: 60 mL

## 2024-04-24 MED ORDER — ONDANSETRON HCL 4 MG/2ML IJ SOLN: 4.0000 mg | Freq: Four times a day (QID) | INTRAMUSCULAR | Status: DC | PRN

## 2024-04-24 MED ORDER — LIDOCAINE HCL (PF) 1 % IJ SOLN
INTRAMUSCULAR | Status: DC | PRN
  Administered 2024-04-24: 50 mL

## 2024-04-24 MED ORDER — MIDAZOLAM HCL 2 MG/2ML IJ SOLN
INTRAMUSCULAR | Status: AC
Start: 2024-04-24 — End: ?
  Filled 2024-04-24: qty 2

## 2024-04-24 MED ORDER — SODIUM CHLORIDE 0.9 % IV SOLN
INTRAVENOUS | Status: DC | PRN
  Administered 2024-04-24: 80 mg

## 2024-04-24 MED ORDER — HEPARIN (PORCINE) IN NACL 1000-0.9 UT/500ML-% IV SOLN
INTRAVENOUS | Status: DC | PRN
  Administered 2024-04-24: 500 mL

## 2024-04-24 MED ORDER — MAGNESIUM OXIDE -MG SUPPLEMENT 400 (240 MG) MG PO TABS
400.0000 mg | ORAL_TABLET | Freq: Every morning | ORAL | Status: DC
  Administered 2024-04-25: 400 mg via ORAL
  Filled 2024-04-24: qty 1

## 2024-04-24 MED ORDER — POTASSIUM 99 MG PO TABS: 99.0000 mg | ORAL_TABLET | Freq: Every day | ORAL | Status: DC

## 2024-04-24 MED ORDER — CEFAZOLIN SODIUM-DEXTROSE 2-4 GM/100ML-% IV SOLN
2.0000 g | INTRAVENOUS | Status: AC
  Administered 2024-04-24: 2 g via INTRAVENOUS

## 2024-04-24 MED ORDER — LOSARTAN POTASSIUM 50 MG PO TABS
50.0000 mg | ORAL_TABLET | Freq: Every day | ORAL | Status: DC
  Administered 2024-04-25: 50 mg via ORAL
  Filled 2024-04-24: qty 1

## 2024-04-24 MED ORDER — FLECAINIDE ACETATE 50 MG PO TABS
50.0000 mg | ORAL_TABLET | Freq: Two times a day (BID) | ORAL | Status: DC
  Administered 2024-04-24 – 2024-04-25 (×2): 50 mg via ORAL
  Filled 2024-04-24 (×2): qty 1

## 2024-04-24 MED ORDER — MIDAZOLAM HCL 5 MG/5ML IJ SOLN
INTRAMUSCULAR | Status: DC | PRN
  Administered 2024-04-24: 1 mg via INTRAVENOUS
  Administered 2024-04-24: 2 mg via INTRAVENOUS

## 2024-04-24 MED ORDER — SODIUM CHLORIDE 0.9 % IV SOLN
INTRAVENOUS | Status: AC
  Filled 2024-04-24: qty 2

## 2024-04-24 MED ORDER — APIXABAN 5 MG PO TABS: 5.0000 mg | ORAL_TABLET | Freq: Two times a day (BID) | ORAL | 5 refills | Status: DC

## 2024-04-24 MED ORDER — FENTANYL CITRATE (PF) 100 MCG/2ML IJ SOLN
INTRAMUSCULAR | Status: AC
  Filled 2024-04-24: qty 2

## 2024-04-24 MED ORDER — METOPROLOL TARTRATE 25 MG PO TABS
25.0000 mg | ORAL_TABLET | Freq: Two times a day (BID) | ORAL | Status: DC
  Administered 2024-04-24 – 2024-04-25 (×2): 25 mg via ORAL
  Filled 2024-04-24 (×2): qty 1

## 2024-04-24 MED ORDER — FENTANYL CITRATE (PF) 100 MCG/2ML IJ SOLN
INTRAMUSCULAR | Status: DC | PRN
  Administered 2024-04-24 (×2): 25 ug via INTRAVENOUS

## 2024-04-24 MED ORDER — SODIUM CHLORIDE 0.9 % IV SOLN
80.0000 mg | INTRAVENOUS | Status: AC
  Administered 2024-04-24: 80 mg

## 2024-04-24 MED ORDER — LIDOCAINE HCL 1 % IJ SOLN
INTRAMUSCULAR | Status: AC
  Filled 2024-04-24: qty 40

## 2024-04-24 MED ORDER — GENTAMICIN SULFATE 40 MG/ML IJ SOLN
INTRAMUSCULAR | Status: AC
  Filled 2024-04-24: qty 2

## 2024-04-24 MED ORDER — POVIDONE-IODINE 10 % EX SWAB
2.0000 | Freq: Once | CUTANEOUS | Status: AC
  Administered 2024-04-24: 2 via TOPICAL

## 2024-04-24 SURGICAL SUPPLY — 13 items
CABLE SURGICAL S-101-97-12 (CABLE) ×1 IMPLANT
CATH CPS LOCATOR 3D MED XLNG (CATHETERS) IMPLANT
KIT MICROPUNCTURE NIT STIFF (SHEATH) IMPLANT
LEAD ULTIPACE 52 LPA1231/52 (Lead) IMPLANT
LEAD ULTIPACE 65 LPA1231/65 (Lead) IMPLANT
PACEMAKER ASSURITY DR-RF (Pacemaker) IMPLANT
PAD DEFIB RADIO PHYSIO CONN (PAD) ×1 IMPLANT
SHEATH 7FR PRELUDE SNAP 13 (SHEATH) IMPLANT
SHEATH 9FR PRELUDE SNAP 13 (SHEATH) IMPLANT
SLITTER AGILIS HISPRO (INSTRUMENTS) IMPLANT
TOOL HELIX LOCKING (MISCELLANEOUS) IMPLANT
TRAY PACEMAKER INSERTION (PACKS) ×1 IMPLANT
WIRE HI TORQ VERSACORE-J 145CM (WIRE) IMPLANT

## 2024-04-24 SURGICAL SUPPLY — 12 items
CABLE SURGICAL S-101-97-12 (CABLE) ×1 IMPLANT
CATH CPS LOCATOR 3D MED (CATHETERS) IMPLANT
KIT MICROPUNCTURE NIT STIFF (SHEATH) IMPLANT
LEAD ULTIPACE 65 LPA1231/65 (Lead) IMPLANT
PAD DEFIB RADIO PHYSIO CONN (PAD) ×1 IMPLANT
POUCH AIGIS-R ANTIBACT PPM (Mesh General) ×1 IMPLANT
POUCH AIGIS-R ANTIBACT PPM MED (Mesh General) IMPLANT
SHEATH 9FR PRELUDE SNAP 13 (SHEATH) IMPLANT
SLITTER AGILIS HISPRO (INSTRUMENTS) IMPLANT
TOOL HELIX LOCKING (MISCELLANEOUS) IMPLANT
TRAY PACEMAKER INSERTION (PACKS) ×1 IMPLANT
WIRE GUIDERIGHT .032X150 (WIRE) IMPLANT

## 2024-04-24 NOTE — Discharge Instructions (Signed)
 After Your Cardiac Device   You have a Abbot Device  ACTIVITY Do not lift your arm above shoulder height for 1 week after your procedure. After 7 days, you may progress as below.  You should remove your sling 24 hours after your procedure, unless otherwise instructed by your provider.     Friday May 01, 2024  Saturday May 02, 2024 Sunday May 03, 2024 Monday May 04, 2024   Do not lift, push, pull, or carry anything over 10 pounds with the affected arm until 6 weeks (Friday June 05, 2024 ) after your procedure.   You may drive AFTER your wound check, unless you have been told otherwise by your provider.   Ask your healthcare provider when you can go back to work   INCISION/Dressing If you are on a blood thinner such as Coumadin, Xarelto, Eliquis , Plavix, or Pradaxa please confirm with your provider when this should be resumed.   If large square, outer bandage is left in place, this can be removed after 24 hours from your procedure. Do not remove steri-strips or glue as below.   Monitor your cardiac device site for redness, swelling, and drainage. Call the device clinic at [phone] if you experience these symptoms or fever/chills.  If your incision is sealed with Steri-strips or staples, you may shower 7 days after your procedure or when told by your provider. Do not remove the steri-strips or let the shower hit directly on your site. You may wash around your site with soap and water.    If you were discharged in a sling, please do not wear this during the day more than 48 hours after your surgery unless otherwise instructed. This may increase the risk of stiffness and soreness in your shoulder.   Avoid lotions, ointments, or perfumes over your incision until it is well-healed.  You may use a hot tub or a pool AFTER your wound check appointment if the incision is completely closed.   DEVICE MANAGEMENT You should receive your ID card for your new device in 4-8 weeks. Keep this  card with you at all times once received. Consider wearing a medical alert bracelet or necklace.  Please follow manufacture instructions for keeping your device charged carefully. This should be performed every 2 weeks at the very least.   Your cardiac device may be MRI compatible. This will be discussed at your next office visit/wound check.  You should avoid contact with strong electric or magnetic fields.   Do not use amateur (ham) radio equipment or electric (arc) welding torches. MP3 player headphones with magnets should not be used. Some devices are safe to use if held at least 12 inches (30 cm) from your cardiac device. These include power tools, lawn mowers, and speakers. If you are unsure if something is safe to use, ask your health care provider.  When using your cell phone, hold it to the ear that is on the opposite side from the cardiac device. Do not leave your cell phone in a pocket over the cardiac device.  You may safely use electric blankets, heating pads, computers, and microwave ovens.  Call the office right away if: You have chest pain. You feel more short of breath than you have felt before. You feel more light-headed than you have felt before. Your incision starts to open up.  This information is not intended to replace advice given to you by your health care provider. Make sure you discuss any questions you have with your  health care provider.

## 2024-04-24 NOTE — H&P (Signed)
 Electrophysiology Office Note:    Date:  04/24/2024   ID:  [name], [name] 01/25/1951, MRN [id number]  PCP:  Wilburn Handler, MD   [facility] Providers Cardiologist:  Hazle Lites, MD Electrophysiologist:  Efraim Grange, MD     Referring MD: Arlester Ladd Donnamae Gaba, MD   History of Present Illness:    [name] [name] is a 73 y.o. female with a hx listed below, significant for hypertension and atrial fibrillation, referred for arrhythmia management.  The patient was originally diagnosed with atrial fibrillation in 2017.  She had been doing well but was incidentally found to be in atrial fibrillation during an dentist visit in 03-11-22.  She was discharged in rate controlled atrial fibrillation but subsequently admitted with heart failure and atrial fibrillation.  She has since undergone DC cardioversions and March 11, 2024 and, after additional recurrences, in November and December 2023.    She has palpitations, fatigue, shortness of breath but no chest pain, syncope, pre-syncope.  She underwent A-fib ablation on January 29, 2023.  She has done well until recently.  She ran of her flecainide  and missed a dose on Friday and then had recurrence later that afternoon.  She has remained in atrial fibrillation since.  She underwent ablation in September 2024.  Since then, she has had only a few brief episodes that she has noted her heart rate increased to the 80s or 90s.  Otherwise her heart rate tends to run in the upper 40s or 50s.  She thinks these episodes of elevated heart rates may have been atrial fibrillation but she is not sure.  She has not had any dizziness, presyncope, chest pain, palpitations.  She presents today for pacemaker placement. She reports that she has not had any recent changes in diagnoses, medications, or problems.  EKGs/Labs/Other Studies Reviewed Today:     TTE: 03/11/22 EF 60-65%. Moderately dilated LA  EKG:  Last EKG results: today - Atrial fibrillation  Monitor  03-11-24 14 day monitor   Sinus rhythm HR 38-79 bpm, avg 50 bpm  264 episodes of SVT, HR 73-185, avg 113 bpm Symptoms episodes correlated with ectopy, often in runs. Some episodes correlated with sinus rhythm.  Recent Labs: 03/30/2024: BUN 6; Creatinine, Ser 0.65; Hemoglobin 14.2; Platelets 115; Potassium 4.0; Sodium 140     Physical Exam:    VS:  BP (!) 153/87   Pulse (!) 53   Temp 98.2 F (36.8 C) (Oral)   Resp 17   Ht 5\' 3"  (1.6 m)   Wt 99.8 kg   SpO2 95%   BMI 38.97 kg/m     Wt Readings from Last 3 Encounters:  04/24/24 99.8 kg  03/21/24 99.8 kg  03/16/24 100.6 kg     GEN: Well nourished, well developed in no acute distress CARDIAC: iRRR, no murmurs, rubs, gallops RESPIRATORY:  Normal work of breathing MUSCULOSKELETAL: no edema    ASSESSMENT & PLAN:    Atrial fibrillation, persistent: symptomatic, failed flecainide .  Status post atrial fibrillation ablation January 29, 2023; she required DC cardioversion during the ablation. AF recurred after missing flecainide  S/p ablation again 07/2023 and appears to be relatively free of atrial fibrillation, though she does have episodes of SVT Continue metoprolol  Continue apixaban  5 mg twice daily  Bradycardia Persistently bradycardic despite discontinuing She needs additional rate control medication She is symptomatic with exertional fatigue and poor energy Continue metoprolol  0.5 mg p.o. twice daily Sinus heart rates are slow, max of 79 bpm while wearing  the monitor. At this point, after multiple ablations with moderately well-controlled arrhythmia on flecainide  and arguably need for additional medication, she has an indication for pacemaker placement due to sinus bradycardia and chronotropic incompetence.  I discussed the indication for the procedure and the logistics, risks, potential benefit, and after care. I specifically explained that risks include but are not limited to infection, bleeding,damage to blood vessels,  lung, and the heart -- but risk of prolonged hospitalization, need for surgery, or the event of stroke, heart attack, or death are low but not zero.    Secondary hyercoagulable state: continue eliquis   Obesity: I informed her that her risks of maintaining sinus rhythm will be significantly higher if she loses weight. I encouraged her to monitor her caloric intake and eat a higher proportion for vegetables and fruits and avoid fats and refined carbohydrates.  Sleep apnea       Medication Adjustments/Labs and Tests Ordered: Current medicines are reviewed at length with the patient today.  Concerns regarding medicines are outlined above.  Orders Placed This Encounter  Procedures   Informed Consent Details: Physician/Practitioner Attestation; Transcribe to consent form and obtain patient signature   Initiate Pre-op Protocol   Apply Cardiac Implantable Device Care Plan   Void on call to EP Lab   Electrode Placement Place arm electrodes on posterior shoulders   Confirm CBC and BMP (or CMP) results within 7 days for inpatient and 30 days for outpatient:   Pre-admission testing diagnosis   Use clippers to remove hair, entire chest area   SCRUB WITH Chlorhexidine  (HIBICLENS ) 4%   EP PPM/ICD IMPLANT   Insert peripheral IV   Meds ordered this encounter  Medications   0.9 %  sodium chloride  infusion   gentamicin (GARAMYCIN) 80 mg in sodium chloride  0.9 % 500 mL irrigation   chlorhexidine  (HIBICLENS ) 4 % liquid 4 Application   ceFAZolin  (ANCEF ) IVPB 2g/100 mL premix    Indication::   Surgical Prophylaxis   povidone-iodine 10 % swab 2 Application     Signed, Bernece Gall E Quinita Kostelecky, MD  04/24/2024 4:21 PM    [facility]

## 2024-04-24 NOTE — Progress Notes (Signed)
 [employer] rep let this nurse know that a wire seemed to be disconnected from her pacemaker. CXR had not resulted yet but images were pulled up, Rep notified MD mealor of what he saw. I called reading room to have them read the imaging sooner. MD decided to take patient back to cath lab to repair wire. Patient left short stay at 1930 to return to cath lab and patient will not be coming back to short stay postop. Family visited with patient and is aware of the plan and that patient will be admitted.

## 2024-04-24 NOTE — Interval H&P Note (Signed)
 History and Physical Interval Note:  04/24/2024 7:20 PM  She underwent dual chamber pacemaker placement a few hours ago. The RV lead was noted to have pulled back on her CXR and device check, so she will be undergoing RV lead revision. We reviewed the procedure again; all questions answered.   [name] [name]

## 2024-04-25 ENCOUNTER — Ambulatory Visit (HOSPITAL_COMMUNITY)

## 2024-04-25 ENCOUNTER — Encounter (HOSPITAL_COMMUNITY): Payer: Self-pay | Admitting: Cardiovascular Disease

## 2024-04-25 DIAGNOSIS — I471 Supraventricular tachycardia, unspecified: Secondary | ICD-10-CM | POA: Diagnosis not present

## 2024-04-25 DIAGNOSIS — M96841 Postprocedural hematoma of a musculoskeletal structure following other procedure: Secondary | ICD-10-CM | POA: Diagnosis not present

## 2024-04-25 DIAGNOSIS — T82120A Displacement of cardiac electrode, initial encounter: Secondary | ICD-10-CM | POA: Diagnosis not present

## 2024-04-25 DIAGNOSIS — Z950 Presence of cardiac pacemaker: Secondary | ICD-10-CM | POA: Diagnosis not present

## 2024-04-25 DIAGNOSIS — I5032 Chronic diastolic (congestive) heart failure: Secondary | ICD-10-CM | POA: Diagnosis not present

## 2024-04-25 DIAGNOSIS — I110 Hypertensive heart disease with heart failure: Secondary | ICD-10-CM | POA: Diagnosis not present

## 2024-04-25 DIAGNOSIS — I700 Atherosclerosis of aorta: Secondary | ICD-10-CM | POA: Diagnosis not present

## 2024-04-25 DIAGNOSIS — D6869 Other thrombophilia: Secondary | ICD-10-CM | POA: Diagnosis not present

## 2024-04-25 DIAGNOSIS — I4819 Other persistent atrial fibrillation: Secondary | ICD-10-CM | POA: Diagnosis not present

## 2024-04-25 DIAGNOSIS — I517 Cardiomegaly: Secondary | ICD-10-CM | POA: Diagnosis not present

## 2024-04-25 DIAGNOSIS — Z79899 Other long term (current) drug therapy: Secondary | ICD-10-CM | POA: Diagnosis not present

## 2024-04-25 DIAGNOSIS — E669 Obesity, unspecified: Secondary | ICD-10-CM | POA: Diagnosis not present

## 2024-04-25 DIAGNOSIS — R001 Bradycardia, unspecified: Secondary | ICD-10-CM | POA: Diagnosis not present

## 2024-04-25 DIAGNOSIS — Z6838 Body mass index (BMI) 38.0-38.9, adult: Secondary | ICD-10-CM | POA: Diagnosis not present

## 2024-04-25 DIAGNOSIS — I771 Stricture of artery: Secondary | ICD-10-CM | POA: Diagnosis not present

## 2024-04-25 DIAGNOSIS — Z7901 Long term (current) use of anticoagulants: Secondary | ICD-10-CM | POA: Diagnosis not present

## 2024-04-25 MED ORDER — METOPROLOL TARTRATE 25 MG PO TABS
25.0000 mg | ORAL_TABLET | Freq: Two times a day (BID) | ORAL | 1 refills | Status: DC
Start: 2024-04-25 — End: 2024-05-07

## 2024-04-25 MED ORDER — APIXABAN 5 MG PO TABS: 5.0000 mg | ORAL_TABLET | Freq: Two times a day (BID) | ORAL | 5 refills | Status: DC

## 2024-04-25 NOTE — Progress Notes (Addendum)
 AVS completed for discharge packet; Informed by RN IV meds are due before discharge. AVS given to RN to review after meds have completed.

## 2024-04-25 NOTE — Discharge Summary (Signed)
 ELECTROPHYSIOLOGY PROCEDURE DISCHARGE SUMMARY    Patient ID: [name] [name],  MRN: [id number], DOB/AGE: 02/26/1951 73 y.o.  Admit date: 04/24/2024 Discharge date: 04/25/2024  Primary Care Physician: Wilburn Handler, MD  Primary Cardiologist: Hazle Lites, MD  Electrophysiologist: Dr. Arlester Ladd   Primary Discharge Diagnosis:  Symptomatic bradycardia status post pacemaker implantation this admission  Secondary Discharge Diagnosis:  Paroxysmal Atrial Fibrillation - s/p ablation Chronic [name] HTN OSA  No Known Allergies  Procedures This Admission:  PPM Placement: 04/24/2024 SURGEON:  Marlane Silver, MD      PREPROCEDURE DIAGNOSES:   1. Irreversible symptomatic bradycardia due to sinus bradycardia     POSTPROCEDURE DIAGNOSES:   1. Irreversible symptomatic bradycardia due to sinus bradycardia     PROCEDURES:    1. Dual chamber permanent pacemaker implantation     INTRODUCTION: [name] [name] is a 81 y.o. patient who presents to the EP lab for dual chamber permanent pacemaker implantation.      DESCRIPTION OF PROCEDURE:  Informed written consent was obtained and the patient was brought to the electrophysiology lab in the fasting state. The patient was adequately sedated with intravenous Versed , and fentanyl  as outlined in the nursing report.  The patient's left chest was prepped and draped in the usual sterile fashion by the EP lab staff.  The skin overlying the left deltopectoral region was infiltrated with lidocaine  for local analgesia.  An incision was created over the left deltopectoral region.  A left subcutaneous pocket was fashioned using a combination of sharp and blunt dissection immediately superficial to the pectoral fascia.  Electrocautery was used to assure hemostasis.    Lead Placement: The left axillary vein was cannulated using modified seldinger techniques.  Through the left axillary vein, an RV pace/sense lead was advanced to the RV mid septum and advanced deeply,  pausing intermittently to assess lead parameters. The lead was secured to the pectoral fascia. Next, the right atrial lead was advanced to the RA appendage. It was secured to the pectoral fascia.  Lead parameters are detailed below.     RA RV  Model 1231 1231  Serial # [id number]  Amplitude 2.1 mV 10.3 mV  Threshold 0.7 V @ 0.5 S 1.5 V @ 0.5 S  Impedence 474 ohms 533 ohms      The pocket was irrigated with copious gentamicin  solution.  The leads were then  connected to a pulse generator (model Axxurity MRI, serial [id number]). The pocket was closed in layers of absorbable suture. EBL < 10mL. Steri-strips and a sterile dressing were applied.   During this procedure the patient is administered Versed  and Fentanyl  by a trained provider under my direct supervision to achieve and maintain moderate conscious sedation.  The patient's heart rate, blood pressure, and oxygen saturation are monitored continuously during the procedure. The period of conscious sedation is 37 minutes, of which I was present face-to-face 100% of this time.       CONCLUSIONS:   1. Successful dual chamber permanent pacemaker implantation  3.  No early apparent complications.     RV Lead Revision: 04/24/2024 SURGEON:  Marlane Silver, MD      PREPROCEDURE DIAGNOSES:   1. RV lead dislodgement     POSTPROCEDURE DIAGNOSES:   1. RV lead dislodgement     PROCEDURES:    1. RV lead revision     INTRODUCTION: [name] [name] is a 53 y.o. patient who presents to the EP lab for RV lead revision  DESCRIPTION OF PROCEDURE:  Informed written consent was obtained and the patient was brought to the electrophysiology lab in the fasting state. The patient was adequately sedated with intravenous Versed , and fentanyl  as outlined in the nursing report.  The patient's left chest was prepped and draped in the usual sterile fashion by the EP lab staff.  The skin overlying the left deltopectoral region was infiltrated with lidocaine   for local analgesia.  The sutures from the fresh device placement were severed sharply and the device removed from the pocket.  The prior RV lead had fallen free. The helix was withdrawn and the lead removed.   Lead Placement: The left axillary vein was cannulated using modified seldinger techniques.  Through the left axillary vein, an RV pace/sense lead was advanced to the RV mid septum and advanced deeply, pausing intermittently to assess lead parameters. The lead was secured to the pectoral fascia with multiple loops of 0-ethibond.  Lead parameters are detailed below.     RA RV  Model See prior case 1231  Serial #   [id number]  Amplitude   13.5 mV  Threshold   1.3 V @ 0.5 S  Impedence   549 ohms      The pocket was irrigated with copious gentamicin  solution.  The leads were then  connected to the pulse generator. The pocket was closed in layers of absorbable suture. EBL < 10mL. Steri-strips and a sterile dressing were applied.   During this procedure the patient is administered Versed  and Fentanyl  by a trained provider under my direct supervision to achieve and maintain moderate conscious sedation.  The patient's heart rate, blood pressure, and oxygen saturation are monitored continuously during the procedure. The period of conscious sedation is 50 minutes, of which I was present face-to-face 100% of this time.       CONCLUSIONS:   1. Successful RV lead revision  3.  No early apparent complications.     1.  Implantation of an Abbott Dual-chamber PPM. Post-procedural complications included repeat CXR showing the RV lead was noted to have pulled back and she underwent RV lead revision as discussed above.  2.  CXR on 04/25/2024 demonstrated no pneumothorax status post device implantation.    Brief HPI: [name] [name] is a 73 y.o. female and she has been followed by EP as an outpatient for persistent atrial fibrillation having undergone 2 ablations in the past. Was found to have persistent  bradycardia despite medication adjustment with associated fatigue. PPM placement was recommended due to sinus bradycardia and chronotropic incompetence. Past medical history includes chronic [name], HTN, paroxysmal atrial fibrillation OSA.  The patient has had symptomatic bradycardia without reversible causes identified.  Risks, benefits, and alternatives to PPM implantation were reviewed with the patient who wished to proceed.   Hospital Course:  The patient was admitted and underwent implantation of a Abbott dual chamber PPM with details as outlined above.    She was monitored on telemetry overnight which demonstrated appropriate pacing. The device was interrogated and found to be functioning normally. CXR was obtained and demonstrated no pneumothorax status post device implantation. Wound care, arm mobility, and restrictions were reviewed with the patient. Was noted to have a moderate sized hematoma along the left shoulder which was fairly firm and a pressure bandage was applied by Dr. Marven Slimmer. Device clinic appointment was recommended for early in the week and a staff message has been sent to arrange for this. The patient was examined and considered stable for discharge to  home.    Anticoagulation resumption This patient should resume their Eliquis  on Thursday, June 5th.    Discharge Vitals: Vitals:   04/25/24 0535 04/25/24 0756 04/25/24 1028 04/25/24 1100  BP: (!) 152/87 (!) 146/82 138/82 136/84  Pulse: 64  64 67  Resp: (!) 24 20  19   Temp: 98.3 F (36.8 C) 98.5 F (36.9 C)    TempSrc: Oral Oral    SpO2: 96% 94%  96%  Weight:      Height:         Discharge Medications:  Allergies as of 04/25/2024   No Known Allergies      Medication List     TAKE these medications    acetaminophen  500 MG tablet Commonly known as: TYLENOL  Take 1,000 mg by mouth every 6 (six) hours as needed for mild pain (pain score 1-3) or headache. Rapid release   apixaban  5 MG Tabs tablet Commonly  known as: Eliquis  Take 1 tablet (5 mg total) by mouth 2 (two) times daily. Start taking on: April 30, 2024 What changed: These instructions start on April 30, 2024. If you are unsure what to do until then, ask your doctor or other care provider. Notes to patient: DO NOT RESTART ELIQUIS  UNTIL JUNE 5th!!!!   APPLE CIDER VINEGAR PO Take 450 mg by mouth every morning.   busPIRone  5 MG tablet Commonly known as: BUSPAR  Take 5 mg by mouth 2 (two) times daily.   flecainide  50 MG tablet Commonly known as: TAMBOCOR  Take 1 tablet (50 mg total) by mouth 2 (two) times daily.   fluticasone 50 MCG/ACT nasal spray Commonly known as: FLONASE Place 2 sprays into both nostrils 2 (two) times daily.   furosemide  20 MG tablet Commonly known as: LASIX  Take 1 tablet (20 mg total) by mouth daily as needed. What changed: reasons to take this   losartan  50 MG tablet Commonly known as: COZAAR  Take 50 mg by mouth daily.   Magnesium  400 MG Caps Take 400 mg by mouth every morning.   meclizine 25 MG tablet Commonly known as: ANTIVERT Take 25 mg by mouth daily as needed for dizziness.   metoprolol  tartrate 25 MG tablet Commonly known as: LOPRESSOR  Take 1 tablet (25 mg total) by mouth 2 (two) times daily.   multivitamin with minerals Tabs tablet Take 1 tablet by mouth daily.   NON FORMULARY Pt uses a cpap nightly   Potassium 99 MG Tabs Take 99 mg by mouth daily.   VITAMIN D  PO Take 5,000 Units by mouth daily. 125 mcg   vitamin k 100 MCG tablet Take 100 mcg by mouth daily.        Disposition:  Home with usual follow up as in AVS   Duration of Discharge Encounter:  APP time: 27 minutes  Signed, Dorma Gash, PA-C  04/25/2024 11:50 AM

## 2024-04-25 NOTE — Progress Notes (Signed)
   Rounding Note    Patient Name: [name] [name] Date of Encounter: 04/25/2024  [facility] Cardiologist: Hazle Lites, MD   Subjective   No acute events overnight.  Tender in the left shoulder.  Vital Signs    Vitals:   04/24/24 2117 04/24/24 2345 04/25/24 0535 04/25/24 0756  BP: 137/82 129/79 (!) 152/87 (!) 146/82  Pulse: 66 64 64   Resp: (!) 21 (!) 22 (!) 24 20  Temp: 97.7 F (36.5 C) 97.9 F (36.6 C) 98.3 F (36.8 C) 98.5 F (36.9 C)  TempSrc: Oral Oral Oral Oral  SpO2: 95% 95% 96% 94%  Weight: 102 kg     Height: 5\' 3"  (1.6 m)       Intake/Output Summary (Last 24 hours) at 04/25/2024 1017 Last data filed at 04/25/2024 0547 Gross per 24 hour  Intake 50 ml  Output 800 ml  Net -750 ml      04/24/2024    9:17 PM 04/24/2024   12:39 PM 03/21/2024    1:28 PM  Last 3 Weights  Weight (lbs) 224 lb 13.9 oz 220 lb 220 lb  Weight (kg) 102 kg 99.791 kg 99.791 kg      Telemetry    Intermittent atrial pacing.- Personally Reviewed   Physical Exam    GEN: No acute distress.   Cardiac: RRR, no murmurs, rubs, or gallops.  Left prepectoral pocket with moderate-sized hematoma.  Small amount of bloody drainage from the incision.  Tender to palpation around the shoulder. Respiratory: Clear to auscultation bilaterally. Psych: Normal affect   Assessment & Plan    #Left prepectoral pocket hematoma Moderate-sized hematoma in the left shoulder.  Fairly firm.  I do not suspect active bleeding.  I personally applied a pressure bandage to the left shoulder.  She should have a device clinic appointment early this week for pressure bandage removal.  #Persistent atrial fibrillation Recommend holding anticoagulation for 5 days given pocket hematoma.  Recommend restarting June 5.  #Permanent pacemaker in situ Device interrogation this morning shows stable device function.  Chest x-ray shows stable lead position.  Okay to discharge.     Donelda Fujita T. Marven Slimmer, MD, [facility],  [facility] Cardiac Electrophysiology

## 2024-04-25 NOTE — Plan of Care (Signed)
  Problem: Education: Goal: Knowledge of cardiac device and self-care will improve Outcome: Progressing   Problem: Cardiac: Goal: Ability to achieve and maintain adequate cardiopulmonary perfusion will improve Outcome: Progressing   Problem: Education: Goal: Knowledge of General Education information will improve Description: Including pain rating scale, medication(s)/side effects and non-pharmacologic comfort measures Outcome: Progressing   Problem: Clinical Measurements: Goal: Ability to maintain clinical measurements within normal limits will improve Outcome: Progressing Goal: Will remain free from infection Outcome: Progressing Goal: Diagnostic test results will improve Outcome: Progressing Goal: Respiratory complications will improve Outcome: Progressing

## 2024-04-27 MED FILL — Midazolam HCl Inj PF 2 MG/2ML (Base Equivalent): INTRAMUSCULAR | Qty: 2 | Status: AC

## 2024-04-28 ENCOUNTER — Ambulatory Visit: Attending: Student | Admitting: Student

## 2024-04-28 DIAGNOSIS — Z950 Presence of cardiac pacemaker: Secondary | ICD-10-CM

## 2024-04-28 NOTE — Progress Notes (Signed)
  Pressure dressing removed without incident.   Small amount of ecchymosis. No drainage or bleeding.   OK to resume Eliquis  6/5 with the evening dose.   Return next week for wound check.   Bambi Lever [address]" Moro, PA-C  04/28/2024 12:19 PM

## 2024-04-28 NOTE — Patient Instructions (Signed)
 Medication Instructions:  No medication changes today. *If you need a refill on your cardiac medications before your next appointment, please call your pharmacy*  Lab Work: No labwork ordered today. If you have labs (blood work) drawn today and your tests are completely normal, you will receive your results only by: MyChart Message (if you have MyChart) OR A paper copy in the mail If you have any lab test that is abnormal or we need to change your treatment, we will call you to review the results.  Testing/Procedures: No testing ordered today  Follow-Up: At [facility], you and your health needs are our priority.  As part of our continuing mission to provide you with exceptional heart care, our providers are all part of one team.  This team includes your primary Cardiologist (physician) and Advanced Practice Providers or APPs (Physician Assistants and Nurse Practitioners) who all work together to provide you with the care you need, when you need it.  Your next appointment:   As scheduled next week.   Provider:   Joycelyn Noa" Percell Boyers, PA-C  We recommend signing up for the patient portal called "MyChart".  Sign up information is provided on this After Visit Summary.  MyChart is used to connect with patients for Virtual Visits (Telemedicine).  Patients are able to view lab/test results, encounter notes, upcoming appointments, etc.  Non-urgent messages can be sent to your provider as well.   To learn more about what you can do with MyChart, go to [email or web address].

## 2024-04-29 ENCOUNTER — Encounter: Payer: Self-pay | Admitting: Emergency Medicine

## 2024-05-07 ENCOUNTER — Ambulatory Visit: Attending: Student | Admitting: Student

## 2024-05-07 ENCOUNTER — Encounter: Payer: Self-pay | Admitting: Student

## 2024-05-07 DIAGNOSIS — I495 Sick sinus syndrome: Secondary | ICD-10-CM | POA: Diagnosis not present

## 2024-05-07 DIAGNOSIS — I4819 Other persistent atrial fibrillation: Secondary | ICD-10-CM

## 2024-05-07 LAB — CUP PACEART INCLINIC DEVICE CHECK
Battery Remaining Longevity: 127 mo
Battery Voltage: 3.1 V
Brady Statistic RA Percent Paced: 97 %
Brady Statistic RV Percent Paced: 0.43 %
Date Time Interrogation Session: 20250612124328
Implantable Lead Connection Status: 753985
Implantable Lead Connection Status: 753985
Implantable Lead Implant Date: 20250530
Implantable Lead Implant Date: 20250530
Implantable Lead Location: 753859
Implantable Lead Location: 753860
Implantable Pulse Generator Implant Date: 20250530
Lead Channel Impedance Value: 437.5 Ohm
Lead Channel Impedance Value: 487.5 Ohm
Lead Channel Pacing Threshold Amplitude: 0.625 V
Lead Channel Pacing Threshold Amplitude: 1 V
Lead Channel Pacing Threshold Pulse Width: 0.5 ms
Lead Channel Pacing Threshold Pulse Width: 0.5 ms
Lead Channel Sensing Intrinsic Amplitude: 5 mV
Lead Channel Sensing Intrinsic Amplitude: 9.5 mV
Lead Channel Setting Pacing Amplitude: 1.25 V
Lead Channel Setting Pacing Amplitude: 1.625
Lead Channel Setting Pacing Pulse Width: 0.5 ms
Lead Channel Setting Sensing Sensitivity: 2 mV
Pulse Gen Model: 2272
Pulse Gen Serial Number: 8285953

## 2024-05-07 MED ORDER — METOPROLOL TARTRATE 25 MG PO TABS: 25.0000 mg | ORAL_TABLET | Freq: Two times a day (BID) | ORAL | 3 refills | Status: DC

## 2024-05-07 MED ORDER — APIXABAN 5 MG PO TABS: 5.0000 mg | ORAL_TABLET | Freq: Two times a day (BID) | ORAL | 5 refills | Status: AC

## 2024-05-07 MED ORDER — FLECAINIDE ACETATE 50 MG PO TABS: 50.0000 mg | ORAL_TABLET | Freq: Two times a day (BID) | ORAL | 3 refills | Status: AC

## 2024-05-07 NOTE — Progress Notes (Signed)
 Normal dual chamber pacemaker wound check. Presenting rhythm: AP-VS. Wound well healed. Routine testing performed. Thresholds, sensing, and impedances consistent with implant measurements and at auto capture. No episodes. Reviewed arm restrictions to continue for 6 weeks total post op.  Pt enrolled in remote follow-up.

## 2024-05-07 NOTE — Patient Instructions (Signed)
 Medication Instructions:  No medication changes today. *If you need a refill on your cardiac medications before your next appointment, please call your pharmacy*  Lab Work: No labwork ordered today. If you have labs (blood work) drawn today and your tests are completely normal, you will receive your results only by: MyChart Message (if you have MyChart) OR A paper copy in the mail If you have any lab test that is abnormal or we need to change your treatment, we will call you to review the results.  Testing/Procedures: No testing ordered today  Follow-Up: At [facility], you and your health needs are our priority.  As part of our continuing mission to provide you with exceptional heart care, our providers are all part of one team.  This team includes your primary Cardiologist (physician) and Advanced Practice Providers or APPs (Physician Assistants and Nurse Practitioners) who all work together to provide you with the care you need, when you need it.  Your next appointment:   As scheduled below.   Provider:   You may see Efraim Grange, MD or one of the following Advanced Practice Providers on your designated Care Team:   Mertha Abrahams, PA-C Farah Lepak Andy Loisann Roach, PA-C Suzann Riddle, NP Creighton Doffing, NP    We recommend signing up for the patient portal called MyChart.  Sign up information is provided on this After Visit Summary.  MyChart is used to connect with patients for Virtual Visits (Telemedicine).  Patients are able to view lab/test results, encounter notes, upcoming appointments, etc.  Non-urgent messages can be sent to your provider as well.   To learn more about what you can do with MyChart, go to [email or web address].

## 2024-05-09 ENCOUNTER — Ambulatory Visit: Payer: Self-pay | Admitting: Cardiovascular Disease

## 2024-05-11 DIAGNOSIS — I10 Essential (primary) hypertension: Secondary | ICD-10-CM | POA: Diagnosis not present

## 2024-05-11 DIAGNOSIS — I5032 Chronic diastolic (congestive) heart failure: Secondary | ICD-10-CM | POA: Diagnosis not present

## 2024-05-11 DIAGNOSIS — G4733 Obstructive sleep apnea (adult) (pediatric): Secondary | ICD-10-CM | POA: Diagnosis not present

## 2024-05-18 DIAGNOSIS — I10 Essential (primary) hypertension: Secondary | ICD-10-CM | POA: Diagnosis not present

## 2024-05-18 DIAGNOSIS — H6121 Impacted cerumen, right ear: Secondary | ICD-10-CM | POA: Diagnosis not present

## 2024-05-18 DIAGNOSIS — H8109 Meniere's disease, unspecified ear: Secondary | ICD-10-CM | POA: Diagnosis not present

## 2024-05-18 DIAGNOSIS — E7800 Pure hypercholesterolemia, unspecified: Secondary | ICD-10-CM | POA: Diagnosis not present

## 2024-05-18 DIAGNOSIS — I480 Paroxysmal atrial fibrillation: Secondary | ICD-10-CM | POA: Diagnosis not present

## 2024-06-08 ENCOUNTER — Ambulatory Visit

## 2024-06-08 DIAGNOSIS — I495 Sick sinus syndrome: Secondary | ICD-10-CM

## 2024-06-08 LAB — CUP PACEART REMOTE DEVICE CHECK
Battery Remaining Longevity: 123 mo
Battery Remaining Percentage: 95.5 %
Battery Voltage: 3.04 V
Brady Statistic AP VP Percent: 1 %
Brady Statistic AP VS Percent: 95 %
Brady Statistic AS VP Percent: 1 %
Brady Statistic AS VS Percent: 4.5 %
Brady Statistic RA Percent Paced: 95 %
Brady Statistic RV Percent Paced: 1 %
Date Time Interrogation Session: 20250714020014
Implantable Lead Connection Status: 753985
Implantable Lead Connection Status: 753985
Implantable Lead Implant Date: 20250530
Implantable Lead Implant Date: 20250530
Implantable Lead Location: 753859
Implantable Lead Location: 753860
Implantable Pulse Generator Implant Date: 20250530
Lead Channel Impedance Value: 430 Ohm
Lead Channel Impedance Value: 460 Ohm
Lead Channel Pacing Threshold Amplitude: 0.75 V
Lead Channel Pacing Threshold Amplitude: 1 V
Lead Channel Pacing Threshold Pulse Width: 0.5 ms
Lead Channel Pacing Threshold Pulse Width: 0.5 ms
Lead Channel Sensing Intrinsic Amplitude: 12 mV
Lead Channel Sensing Intrinsic Amplitude: 4.1 mV
Lead Channel Setting Pacing Amplitude: 1.25 V
Lead Channel Setting Pacing Amplitude: 1.75 V
Lead Channel Setting Pacing Pulse Width: 0.5 ms
Lead Channel Setting Sensing Sensitivity: 2 mV
Pulse Gen Model: 2272
Pulse Gen Serial Number: 8285953

## 2024-06-10 ENCOUNTER — Ambulatory Visit: Payer: Self-pay | Admitting: Cardiovascular Disease

## 2024-06-10 DIAGNOSIS — I10 Essential (primary) hypertension: Secondary | ICD-10-CM | POA: Diagnosis not present

## 2024-06-10 DIAGNOSIS — G4733 Obstructive sleep apnea (adult) (pediatric): Secondary | ICD-10-CM | POA: Diagnosis not present

## 2024-06-10 DIAGNOSIS — I5032 Chronic diastolic (congestive) heart failure: Secondary | ICD-10-CM | POA: Diagnosis not present

## 2024-07-16 ENCOUNTER — Encounter: Payer: Self-pay | Admitting: Cardiology

## 2024-07-16 ENCOUNTER — Ambulatory Visit: Attending: Cardiology | Admitting: Cardiology

## 2024-07-16 VITALS — BP 144/88 | HR 60 | Ht 63.75 in | Wt 225.2 lb

## 2024-07-16 DIAGNOSIS — G4733 Obstructive sleep apnea (adult) (pediatric): Secondary | ICD-10-CM | POA: Diagnosis not present

## 2024-07-16 DIAGNOSIS — I10 Essential (primary) hypertension: Secondary | ICD-10-CM

## 2024-07-16 NOTE — Patient Instructions (Signed)
 Medication Instructions:  No Changes *If you need a refill on your cardiac medications before your next appointment, please call your pharmacy*  Lab Work: None  Follow-Up: At [facility], you and your health needs are our priority.  As part of our continuing mission to provide you with exceptional heart care, our providers are all part of one team.  This team includes your primary Cardiologist (physician) and Advanced Practice Providers or APPs (Physician Assistants and Nurse Practitioners) who all work together to provide you with the care you need, when you need it.  Your next appointment:   1 year(s)  Provider:   Dr. Wilbert Bihari (Sleep Clinic)

## 2024-07-16 NOTE — Progress Notes (Signed)
 Sleep Medicine  Note    Date:  07/16/2024   ID:  Franki, Alcaide [birth year], MRN [id number]  PCP:  Leigh Lung, MD  Cardiologist: Vinie JAYSON Maxcy, MD   Chief Complaint  Patient presents with   Sleep Apnea   Hypertension    History of Present Illness:  [name] [name] is a 73 y.o. female  with history of paroxysmal atrial fibrillation and hypertension.  Due to her obesity as well as paroxysmal atrial fibrillation there was concern that she could possibly have underlying obstructive sleep apnea driving her A-fib.  She says that prior to OSA, she would get sleepy during the day prior to retiring.  She was told that snores at night and would wake up snoring.  A home sleep study was ordered.  Home sleep study showed moderate obstructive sleep apnea with an AHI of 16.4/h and nocturnal hypoxemia.  She underwent CPAP titration to 5 cm H2O and is now referred for sleep medicine consultation to establish sleep care.  She is doing well with her PAP device and thinks that she has gotten used to it.  She tolerates the nasal mask and feels the pressure is adequate.  Since going on PAP she feels rested in the am and has no significant daytime sleepiness.  She denies any significant mouth or nasal dryness or nasal congestion.  She does not think that she snores. An Epworth Sleepiness Scale score was calculated the office today and this endorsed at 3 arguing against residual daytime sleepiness. Patient denies any episodes of bruxism, restless legs, No gagging hallucinations or cataplectic events.    Past Medical History:  Diagnosis Date   A-fib (HCC)    Anemia    during pneumonia   Arthritis    Asthma    Hypertension    OSA (obstructive sleep apnea)    moderate obstructive sleep apnea with an AHI of 16.4/h.  On CPAP at 5cm H2O    Past Surgical History:  Procedure Laterality Date   ATRIAL FIBRILLATION ABLATION N/A 01/29/2023   Procedure: ATRIAL FIBRILLATION ABLATION;  Surgeon: Nancey Eulas BRAVO,  MD;  Location: [facility] INVASIVE CV LAB;  Service: Cardiovascular;  Laterality: N/A;   ATRIAL FIBRILLATION ABLATION N/A 08/01/2023   Procedure: ATRIAL FIBRILLATION ABLATION;  Surgeon: Nancey Eulas BRAVO, MD;  Location: [facility] INVASIVE CV LAB;  Service: Cardiovascular;  Laterality: N/A;   c section     1973 and 1983   CARDIOVERSION N/A 03/15/2022   Procedure: CARDIOVERSION;  Surgeon: Loni Soyla LABOR, MD;  Location: [facility] ENDOSCOPY;  Service: Cardiovascular;  Laterality: N/A;  1340 shocked @120j , NSR   CARDIOVERSION N/A 11/22/2022   Procedure: CARDIOVERSION;  Surgeon: Maxcy Vinie JAYSON, MD;  Location: [facility] ENDOSCOPY;  Service: Cardiovascular;  Laterality: N/A;   CARDIOVERSION N/A 05/17/2023   Procedure: CARDIOVERSION;  Surgeon: Santo Stanly LABOR, MD;  Location: [facility] INVASIVE CV LAB;  Service: Cardiovascular;  Laterality: N/A;   COLONOSCOPY     LEAD REVISION/REPAIR N/A 04/24/2024   Procedure: LEAD REVISION/REPAIR;  Surgeon: Mealor, Eulas BRAVO, MD;  Location: [facility] INVASIVE CV LAB;  Service: Cardiovascular;  Laterality: N/A;   ORIF ANKLE FRACTURE Left 08/16/2016   ORIF ANKLE FRACTURE Left 08/16/2016   Procedure: OPEN REDUCTION INTERNAL FIXATION (ORIF) LEFT BIMALLEOLAR ANKLE FRACTURE;  Surgeon: Lonni CINDERELLA Poli, MD;  Location: [facility] OR;  Service: Orthopedics;  Laterality: Left;   PACEMAKER IMPLANT N/A 04/24/2024   Procedure: PACEMAKER IMPLANT;  Surgeon: Nancey Eulas BRAVO, MD;  Location: [facility] INVASIVE CV  LAB;  Service: Cardiovascular;  Laterality: N/A;   TUBAL LIGATION      Current Medications: Current Meds  Medication Sig   acetaminophen  (TYLENOL ) 500 MG tablet Take 1,000 mg by mouth every 6 (six) hours as needed for mild pain (pain score 1-3) or headache. Rapid release   apixaban  (ELIQUIS ) 5 MG TABS tablet Take 1 tablet (5 mg total) by mouth 2 (two) times daily.   APPLE CIDER VINEGAR PO Take 450 mg by mouth every morning.   busPIRone  (BUSPAR ) 5 MG tablet Take 5 mg by mouth 2 (two) times daily.   flecainide   (TAMBOCOR ) 50 MG tablet Take 1 tablet (50 mg total) by mouth 2 (two) times daily.   fluticasone (FLONASE) 50 MCG/ACT nasal spray Place 2 sprays into both nostrils 2 (two) times daily.   furosemide  (LASIX ) 20 MG tablet Take 1 tablet (20 mg total) by mouth daily as needed. (Patient taking differently: Take 20 mg by mouth daily as needed for fluid or edema.)   losartan  (COZAAR ) 50 MG tablet Take 50 mg by mouth daily.   Magnesium  400 MG CAPS Take 400 mg by mouth every morning.   meclizine (ANTIVERT) 25 MG tablet Take 25 mg by mouth daily as needed for dizziness.   metoprolol  tartrate (LOPRESSOR ) 25 MG tablet Take 1 tablet (25 mg total) by mouth 2 (two) times daily.   Multiple Vitamin (MULTIVITAMIN WITH MINERALS) TABS tablet Take 1 tablet by mouth daily.   NON FORMULARY Pt uses a cpap nightly   Potassium 99 MG TABS Take 99 mg by mouth daily.   VITAMIN D  PO Take 5,000 Units by mouth daily. 125 mcg   vitamin k 100 MCG tablet Take 100 mcg by mouth daily.    Allergies:   Patient has no known allergies.   Social History   Socioeconomic History   Marital status: Single    Spouse name: Not on file   Number of children: 3   Years of education: Not on file   Highest education level: 12th grade  Occupational History   Not on file  Tobacco Use   Smoking status: Never   Smokeless tobacco: Never   Tobacco comments:    Never smoke 11/07/22  Vaping Use   Vaping status: Never Used  Substance and Sexual Activity   Alcohol use: Yes    Alcohol/week: 1.0 standard drink of alcohol    Types: 1 Cans of beer per week    Comment: 1 beer once a week 04/20/24   Drug use: No   Sexual activity: Not Currently  Other Topics Concern   Not on file  Social History Narrative   Not on file   Social Drivers of Health   Financial Resource Strain: Not on file  Food Insecurity: No Food Insecurity (04/24/2024)   Hunger Vital Sign    Worried About Running Out of Food in the Last Year: Never true    Ran Out of Food  in the Last Year: Never true  Transportation Needs: No Transportation Needs (04/24/2024)   PRAPARE - Administrator, Civil Service (Medical): No    Lack of Transportation (Non-Medical): No  Physical Activity: Not on file  Stress: Not on file  Social Connections: Moderately Isolated (04/24/2024)   Social Connection and Isolation Panel    Frequency of Communication with Friends and Family: More than three times a week    Frequency of Social Gatherings with Friends and Family: More than three times a week  Attends Religious Services: More than 4 times per year    Active Member of Clubs or Organizations: No    Attends Banker Meetings: Never    Marital Status: Divorced     Family History:  The patient's family history includes Asthma in her father; Atrial fibrillation in her mother; Heart attack in her father; Hypertension in her mother.   ROS:   Please see the history of present illness.    ROS All other systems reviewed and are negative.      No data to display          PHYSICAL EXAM:   VS:  BP (!) 144/88   Pulse 60   Ht 5' 3.75 (1.619 m)   Wt 225 lb 3.2 oz (102.2 kg)   SpO2 98%   BMI 38.96 kg/m    GEN: Well nourished, well developed in no acute distress HEENT: Normal NECK: No JVD; No carotid bruits LYMPHATICS: No lymphadenopathy CARDIAC:RRR, no murmurs, rubs, gallops RESPIRATORY:  Clear to auscultation without rales, wheezing or rhonchi  ABDOMEN: Soft, non-tender, non-distended MUSCULOSKELETAL:  No edema; No deformity  SKIN: Warm and dry NEUROLOGIC:  Alert and oriented x 3 PSYCHIATRIC:  Normal affect  Wt Readings from Last 3 Encounters:  07/16/24 225 lb 3.2 oz (102.2 kg)  04/24/24 224 lb 13.9 oz (102 kg)  03/21/24 220 lb (99.8 kg)      Studies/Labs Reviewed:   Home sleep study, CPAP titration, PAP compliance download  Recent Labs: 03/30/2024: BUN 6; Creatinine, Ser 0.65; Hemoglobin 14.2; Platelets 115; Potassium 4.0; Sodium 140     Additional studies/ records that were reviewed today include:  none    ASSESSMENT:    1. OSA (obstructive sleep apnea)   2. Essential hypertension       PLAN:  In order of problems listed above:  OSA - The patient is tolerating PAP therapy well without any problems. The PAP download performed by his DME was personally reviewed and interpreted by me today and showed an AHI of 0.3 /hr on 5 cm H2O with 97% compliance in using more than 4 hours nightly.  The patient has been using and benefiting from PAP use and will continue to benefit from therapy.   Hypertension -BP borderline controlled on exam today -Continue losartan  50 mg daily, Lopressor  25 mg twice daily with as needed refills -I have personally reviewed and interpreted outside labs performed by patient's PCP which showed serum creatinine 0.68 and potassium 4.6 on 05/18/2024  Followup with me in 1 year   Time Spent: 20 minutes total time of encounter, including 15 minutes spent in face-to-face patient care on the date of this encounter. This time includes coordination of care and counseling regarding above mentioned problem list. Remainder of non-face-to-face time involved reviewing chart documents/testing relevant to the patient encounter and documentation in the medical record. I have independently reviewed documentation from referring provider  Medication Adjustments/Labs and Tests Ordered: Current medicines are reviewed at length with the patient today.  Concerns regarding medicines are outlined above.  Medication changes, Labs and Tests ordered today are listed in the Patient Instructions below.  There are no Patient Instructions on file for this visit.   Signed, Wilbert Bihari, MD  07/16/2024 10:51 AM    [facility] [address] [place], [place], [place]  [address] Phone: [phone]; Fax: [phone]

## 2024-07-20 ENCOUNTER — Telehealth: Payer: Self-pay | Admitting: *Deleted

## 2024-07-20 DIAGNOSIS — I4819 Other persistent atrial fibrillation: Secondary | ICD-10-CM

## 2024-07-20 DIAGNOSIS — G4733 Obstructive sleep apnea (adult) (pediatric): Secondary | ICD-10-CM

## 2024-07-20 DIAGNOSIS — I5032 Chronic diastolic (congestive) heart failure: Secondary | ICD-10-CM

## 2024-07-20 NOTE — Telephone Encounter (Signed)
 Supplies order placed to [employer] via community message

## 2024-07-20 NOTE — Telephone Encounter (Signed)
-----   Message from Wilbert Bihari sent at 07/16/2024 10:53 AM EDT ----- Order PAP supplies

## 2024-07-28 ENCOUNTER — Ambulatory Visit: Attending: Student | Admitting: Student

## 2024-07-28 ENCOUNTER — Encounter: Payer: Self-pay | Admitting: Student

## 2024-07-28 VITALS — BP 144/74 | HR 72 | Ht 63.0 in | Wt 225.0 lb

## 2024-07-28 DIAGNOSIS — G4733 Obstructive sleep apnea (adult) (pediatric): Secondary | ICD-10-CM | POA: Diagnosis not present

## 2024-07-28 DIAGNOSIS — I4819 Other persistent atrial fibrillation: Secondary | ICD-10-CM

## 2024-07-28 DIAGNOSIS — I495 Sick sinus syndrome: Secondary | ICD-10-CM | POA: Diagnosis not present

## 2024-07-28 NOTE — Patient Instructions (Signed)
 Medication Instructions:  Your physician recommends that you continue on your current medications as directed. Please refer to the Current Medication list given to you today.  *If you need a refill on your cardiac medications before your next appointment, please call your pharmacy*  Lab Work: None ordered  If you have labs (blood work) drawn today and your tests are completely normal, you will receive your results only by: MyChart Message (if you have MyChart) OR A paper copy in the mail If you have any lab test that is abnormal or we need to change your treatment, we will call you to review the results.  Testing/Procedures: None ordered  Follow-Up: At [facility], you and your health needs are our priority.  As part of our continuing mission to provide you with exceptional heart care, our providers are all part of one team.  This team includes your primary Cardiologist (physician) and Advanced Practice Providers or APPs (Physician Assistants and Nurse Practitioners) who all work together to provide you with the care you need, when you need it.  Your next appointment:   6 month(s)  Provider:   Eulas Furbish, MD, Daphne Barrack, NP, Ozell Jodie Passey, PA-C, or Charlies Arthur, PA-C    We recommend signing up for the patient portal called MyChart.  Sign up information is provided on this After Visit Summary.  MyChart is used to connect with patients for Virtual Visits (Telemedicine).  Patients are able to view lab/test results, encounter notes, upcoming appointments, etc.  Non-urgent messages can be sent to your provider as well.   To learn more about what you can do with MyChart, go to [email or web address].   Other Instructions

## 2024-07-28 NOTE — Progress Notes (Signed)
  Electrophysiology Office Note:   ID:  [name] [name], [name] [name] 25-Apr-1951, MRN [id number]  Primary Cardiologist: Vinie JAYSON Maxcy, MD Electrophysiologist: Eulas FORBES Furbish, MD      History of Present Illness:   [name] [name] is a 73 y.o. female with h/o HTN, OSA, Asthma, and PAF seen today for routine electrophysiology follow-up s/p Pacemaker implant.  Since last being seen in our clinic the patient reports doing very well. Overall, she denies chest pain, palpitations, dyspnea, PND, orthopnea, nausea, vomiting, dizziness, syncope, edema, weight gain, or early satiety.    Review of systems complete and found to be negative unless listed in HPI.   EP Information / Studies Reviewed:    EKG is ordered today. Personal review as below.  EKG Interpretation Date/Time:  Tuesday July 28 2024 11:28:43 EDT Ventricular Rate:  72 PR Interval:  266 QRS Duration:  98 QT Interval:  406 QTC Calculation: 444 R Axis:   -35  Text Interpretation: Atrial-paced rhythm with prolonged AV conduction Left axis deviation Cannot rule out Anterior infarct , age undetermined Confirmed by Lesia Sharper [id number]) on 07/28/2024 11:47:48 AM    PPM Interrogation-  reviewed in detail today,  See PACEART report.  Arrhythmia/Device History S/p ablation 01/29/2023 and redo 08/01/2023  Abbott Dual Chamber PPM 04/24/2024 for symptomatic bradycardia    Physical Exam:   VS:  BP (!) 144/74   Pulse 72   Ht 5' 3 (1.6 m)   Wt 225 lb (102.1 kg)   SpO2 96%   BMI 39.86 kg/m    Wt Readings from Last 3 Encounters:  07/28/24 225 lb (102.1 kg)  07/16/24 225 lb 3.2 oz (102.2 kg)  04/24/24 224 lb 13.9 oz (102 kg)     GEN: No acute distress  NECK: No JVD; No carotid bruits CARDIAC: Regular rate and rhythm, no murmurs, rubs, gallops RESPIRATORY:  Clear to auscultation without rales, wheezing or rhonchi  ABDOMEN: Soft, non-tender, non-distended EXTREMITIES:  No edema; No deformity   ASSESSMENT AND PLAN:    Symptomatic  bradycardia s/p Abbott PPM  Normal PPM function See Pace Art report No changes today  Persistent AF S/p ablation 01/29/2023 and redo 08/01/2023  Continue flecainide  50 mg BID Continue eliquis  5 mg BID for CHA2DS2VASc of at least 4 Continue lopressor  25 mg BID   OSA  Encouraged nightly CPAP   Disposition:   Follow up with EP Team in 6 months  Signed, Sharper Prentice Lesia, PA-C

## 2024-08-27 ENCOUNTER — Ambulatory Visit: Attending: Internal Medicine | Admitting: Internal Medicine

## 2024-08-27 ENCOUNTER — Encounter: Payer: Self-pay | Admitting: Internal Medicine

## 2024-08-27 VITALS — BP 168/97 | HR 72 | Ht 63.0 in | Wt 225.0 lb

## 2024-08-27 DIAGNOSIS — I4819 Other persistent atrial fibrillation: Secondary | ICD-10-CM

## 2024-08-27 DIAGNOSIS — I5032 Chronic diastolic (congestive) heart failure: Secondary | ICD-10-CM

## 2024-08-27 DIAGNOSIS — Z950 Presence of cardiac pacemaker: Secondary | ICD-10-CM | POA: Diagnosis not present

## 2024-08-27 DIAGNOSIS — I10 Essential (primary) hypertension: Secondary | ICD-10-CM

## 2024-08-27 DIAGNOSIS — G4733 Obstructive sleep apnea (adult) (pediatric): Secondary | ICD-10-CM | POA: Diagnosis not present

## 2024-08-27 MED ORDER — AMLODIPINE BESYLATE 5 MG PO TABS: 5.0000 mg | ORAL_TABLET | Freq: Every day | ORAL | 3 refills | Status: AC

## 2024-08-27 NOTE — Patient Instructions (Signed)
 Medication Instructions:  Your physician has recommended you make the following change in your medication:   -Start amlodipine (norvasc) 5mg  once daily   *If you need a refill on your cardiac medications before your next appointment, please call your pharmacy*   Follow-Up: At [facility], you and your health needs are our priority.  As part of our continuing mission to provide you with exceptional heart care, our providers are all part of one team.  This team includes your primary Cardiologist (physician) and Advanced Practice Providers or APPs (Physician Assistants and Nurse Practitioners) who all work together to provide you with the care you need, when you need it.  Your next appointment:   6 month(s)  Provider:   Josefa Beauvais, NP, Orren Fabry, PA-C, Jon Hails, PA-C, Callie Goodrich, PA-C, Kathleen Johnson, PA-C, or [place], PA-C           We recommend signing up for the patient portal called MyChart.  Sign up information is provided on this After Visit Summary.  MyChart is used to connect with patients for Virtual Visits (Telemedicine).  Patients are able to view lab/test results, encounter notes, upcoming appointments, etc.  Non-urgent messages can be sent to your provider as well.   To learn more about what you can do with MyChart, go to [email or web address].   Other Instructions Please take your blood pressure daily for 2 weeks and send in a MyChart message. Please include heart rates. (One message at the end of the 2 weeks).   HOW TO TAKE YOUR BLOOD PRESSURE: Rest 5 minutes before taking your blood pressure. Don't smoke or drink caffeinated beverages for at least 30 minutes before. Take your blood pressure before (not after) you eat. Sit comfortably with your back supported and both feet on the floor (don't cross your legs). Elevate your arm to heart level on a table or a desk. Use the proper sized cuff. It should fit smoothly and snugly around your bare  upper arm. There should be enough room to slip a fingertip under the cuff. The bottom edge of the cuff should be 1 inch above the crease of the elbow. Ideally, take 3 measurements at one sitting and record the average.

## 2024-08-27 NOTE — Progress Notes (Signed)
 OFFICE NOTE  Chief Complaint:  Routine follow-up  Primary Care Physician: Leigh Lung, MD  HPI:  [name] [name] is a 73 y.o. female with a past medical history significant for hypertension and asthma. She presented with onset of aching in her left shoulder and heart racing. She took an aspirin without any improvement and called EMS. On arrival she was found to be in A. fib with rapid ventricular response at 180 bpm. She was given IV diltiazem  and spontaneously converted to sinus rhythm however had a short recurrence of A. fib in the ER and then again converted back to sinus rhythm. This patients CHA2DS2-VASc Score and unadjusted Ischemic Stroke Rate (% per year) is equal to 2.2 % stroke rate/year from a score of 2. Above score calculated as 1 point each if present [CHF, HTN, DM, Vascular=MI/PAD/Aortic Plaque, Age if 65-74, or Female] Above score calculated as 2 points each if present [Age > 75, or Stroke/TIA/TE]  [name] returns today for follow-up. She underwent an exercise treadmill stress test which was negative for ischemia. Although the report indicated she did not reach target, I personally reviewed it and indicated her heart rate was greater than 85% max predicted heart rate. Therefore this was an adequate study and low risk. Her echocardiogram demonstrates normal LV function with an EF of 60-65%, there is trivial aortic insufficiency and it was read as grade 2 diastolic dysfunction. Is not clear whether she may eventually been in A. fib or not had recovery of atrial mechanical activity at the time therefore I question the degree of diastolic dysfunction. She denies any chest pain, or shortness of breath with exertion. Blood pressure is fairly well-controlled today. She is tolerating diltiazem  which was a switch from verapamil which she was previously taking. She is also on Eliquis  5 mg twice a day without any bleeding problems. Heart rate is in the 60s today and regular.  08/31/2016  [name] [name]  returns today for follow-up. Unfortunately she recently broke her left lower leg and underwent surgery to repair that. She is in a walking boot. She started some physical therapy for that. Fortunately she has not had any recurrent atrial fibrillation. She did discontinue hear few days prior to surgery and restart it afterwards. Heart rate and bloo pressure are well controlled.  09/24/2017  [name] [name] was seen today in follow-up.  Overall she seems to be doing well.  About a year ago she had an ankle fracture and is still wearing a support brace.  She says she is not as active as she was.  She denies any recurrent A. fib.  Blood pressure appears to be well controlled.  She continues on Eliquis  without any significant bleeding problems.  09/12/2018  [name] [name] is seen today in follow-up.  Over the past year she is done very well.  She had one episode last December of tachycardia and palpitation when she was upset at work.  Otherwise she has had no further A. fib.  She is not exercising much and is given a push mowing her lawn due to her ankle fracture that she had.  She said no bleeding issues on Eliquis .  Blood pressure has been well controlled at home.  09/14/2019  [name] [name] returns today for follow-up.  Overall she is doing well.  She denies any chest pain or worsening shortness of breath.  She reports possibly 1 short episode of atrial fibrillation however is noted to be in sinus rhythm with PACs today.  She denies any bleeding complications  on Eliquis .  She takes diltiazem  for rate control and her blood pressure was top normal today 140/87.  09/06/2020  [name] [name] is seen today for follow-up.  Overall she seems to be doing well.  She may have had one spell of atrial fibrillation which was short-lived.  In general she has good blood pressure control.  EKG today shows normal sinus rhythm.  07/22/2023  [name] [name] returns today for follow-up.  She has been feeling well except she has been having more recent atrial  fibrillation.  She underwent A-fib ablation in March 2024.  She had to be cardioverted during that ablation.  She was started on flecainide  but her AF recurred after missing some doses.  She has remained on flecainide .  She is in a sinus bradycardia today.  There is plan for repeat ablation which is scheduled on September 5 with Dr. Nancey.  08/27/2024  [name] [name] is seen today in follow-up.  She has recently had a lot of work done through [employer].  She is status post Abbott Assurity DR pacemaker which was placed in May 2025.  She also had repeat A-fib ablation.  She is currently on flecainide .  She was just seen recently in EP and doing well although had an episode on September 23 of breakthrough A-fib.  She took extra metoprolol  for that.  She notes that recently her diltiazem  was discontinued because of bradycardia however her blood pressure has accordingly increased.  Today she was 168/97.  Recently at the dentist her blood pressure was high as well.  She is on losartan  and metoprolol .  PMHx:  Past Medical History:  Diagnosis Date   A-fib (HCC)    Anemia    during pneumonia   Arthritis    Asthma    Hypertension    OSA (obstructive sleep apnea)    moderate obstructive sleep apnea with an AHI of 16.4/h.  On CPAP at 5cm H2O    Past Surgical History:  Procedure Laterality Date   ATRIAL FIBRILLATION ABLATION N/A 01/29/2023   Procedure: ATRIAL FIBRILLATION ABLATION;  Surgeon: Nancey Eulas BRAVO, MD;  Location: [facility];  Service: Cardiovascular;  Laterality: N/A;   ATRIAL FIBRILLATION ABLATION N/A 08/01/2023   Procedure: ATRIAL FIBRILLATION ABLATION;  Surgeon: Nancey Eulas BRAVO, MD;  Location: [facility];  Service: Cardiovascular;  Laterality: N/A;   c section     1973 and 1983   CARDIOVERSION N/A 03/15/2022   Procedure: CARDIOVERSION;  Surgeon: Loni Soyla LABOR, MD;  Location: [facility] ENDOSCOPY;  Service: Cardiovascular;  Laterality: N/A;  1340 shocked @120j , NSR   CARDIOVERSION N/A  11/22/2022   Procedure: CARDIOVERSION;  Surgeon: Mona Vinie BROCKS, MD;  Location: [facility] ENDOSCOPY;  Service: Cardiovascular;  Laterality: N/A;   CARDIOVERSION N/A 05/17/2023   Procedure: CARDIOVERSION;  Surgeon: Santo Stanly LABOR, MD;  Location: [facility];  Service: Cardiovascular;  Laterality: N/A;   COLONOSCOPY     LEAD REVISION/REPAIR N/A 04/24/2024   Procedure: LEAD REVISION/REPAIR;  Surgeon: Mealor, Eulas BRAVO, MD;  Location: [facility];  Service: Cardiovascular;  Laterality: N/A;   ORIF ANKLE FRACTURE Left 08/16/2016   ORIF ANKLE FRACTURE Left 08/16/2016   Procedure: OPEN REDUCTION INTERNAL FIXATION (ORIF) LEFT BIMALLEOLAR ANKLE FRACTURE;  Surgeon: Lonni CINDERELLA Poli, MD;  Location: [facility] OR;  Service: Orthopedics;  Laterality: Left;   PACEMAKER IMPLANT N/A 04/24/2024   Procedure: PACEMAKER IMPLANT;  Surgeon: Nancey Eulas BRAVO, MD;  Location: [facility];  Service: Cardiovascular;  Laterality: N/A;  TUBAL LIGATION      FAMHx:  Family History  Problem Relation Age of Onset   Atrial fibrillation Mother    Hypertension Mother    Asthma Father    Heart attack Father     SOCHx:   reports that she has never smoked. She has never used smokeless tobacco. She reports current alcohol use of about 1.0 standard drink of alcohol per week. She reports that she does not use drugs.  ALLERGIES:  No Known Allergies  ROS: Pertinent items noted in HPI and remainder of comprehensive ROS otherwise negative.  HOME MEDS: Current Outpatient Medications  Medication Sig Dispense Refill   acetaminophen  (TYLENOL ) 500 MG tablet Take 1,000 mg by mouth every 6 (six) hours as needed for mild pain (pain score 1-3) or headache. Rapid release     apixaban  (ELIQUIS ) 5 MG TABS tablet Take 1 tablet (5 mg total) by mouth 2 (two) times daily. 60 tablet 5   APPLE CIDER VINEGAR PO Take 450 mg by mouth every morning.     busPIRone  (BUSPAR ) 5 MG tablet Take 5 mg by mouth 2 (two) times  daily.     flecainide  (TAMBOCOR ) 50 MG tablet Take 1 tablet (50 mg total) by mouth 2 (two) times daily. 180 tablet 3   fluticasone (FLONASE) 50 MCG/ACT nasal spray Place 2 sprays into both nostrils 2 (two) times daily.     furosemide  (LASIX ) 20 MG tablet Take 1 tablet (20 mg total) by mouth daily as needed. (Patient taking differently: Take 20 mg by mouth daily as needed for fluid or edema.) 30 tablet 6   losartan  (COZAAR ) 50 MG tablet Take 50 mg by mouth daily.     Magnesium  400 MG CAPS Take 400 mg by mouth every morning.     meclizine (ANTIVERT) 25 MG tablet Take 25 mg by mouth daily as needed for dizziness.     metoprolol  tartrate (LOPRESSOR ) 25 MG tablet Take 1 tablet (25 mg total) by mouth 2 (two) times daily. 180 tablet 3   Multiple Vitamin (MULTIVITAMIN WITH MINERALS) TABS tablet Take 1 tablet by mouth daily.     NON FORMULARY Pt uses a cpap nightly     Potassium 99 MG TABS Take 99 mg by mouth daily.     VITAMIN D  PO Take 5,000 Units by mouth daily. 125 mcg     vitamin k 100 MCG tablet Take 100 mcg by mouth daily.     No current facility-administered medications for this visit.    LABS/IMAGING: No results found for this or any previous visit (from the past 48 hours). No results found.  WEIGHTS: Wt Readings from Last 3 Encounters:  08/27/24 225 lb (102.1 kg)  07/28/24 225 lb (102.1 kg)  07/16/24 225 lb 3.2 oz (102.2 kg)    VITALS: BP (!) 168/97   Pulse 72   Ht 5' 3 (1.6 m)   Wt 225 lb (102.1 kg)   SpO2 96%   BMI 39.86 kg/m   EXAM: General appearance: alert and no distress Neck: no carotid bruit and no JVD Lungs: clear to auscultation bilaterally Heart: regular rate and rhythm, S1, S2 normal, no murmur, click, rub or gallop Abdomen: soft, non-tender; bowel sounds normal; no masses,  no organomegaly Extremities: No edema Pulses: 2+ and symmetric Skin: Skin color, texture, turgor normal. 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   Mrs. Deshmukh seems to be doing better on flecainide  after 2 ablations and a pacemaker placement.  She did have some breakthrough A-fib recently.  She has an upcoming remote check.  Blood pressure has been high after stopping diltiazem .  Will add amlodipine 5 mg daily.  LVEF was normal in 2023.  I advised her to monitor home blood pressures over the next several weeks and report back to us .  She has a follow-up with her primary care provider later this month.  Follow-up with APP in 6 months or sooner as necessary.  Vinie KYM Maxcy, MD, [facility], FNLA, FACP  [place]  [facility]  Medical Director of the [facility]  Cardiovascular Risk Reduction Clinic Diplomate of the American Board of Clinical Lipidology Attending Cardiologist  Direct Dial: [phone]  Fax: [phone]  Website:  [email or web address] Vinie JAYSON Maxcy 08/27/2024, 9:36 AM

## 2024-09-03 NOTE — Progress Notes (Signed)
 Remote PPM Transmission

## 2024-09-07 ENCOUNTER — Ambulatory Visit (INDEPENDENT_AMBULATORY_CARE_PROVIDER_SITE_OTHER)

## 2024-09-07 DIAGNOSIS — I4819 Other persistent atrial fibrillation: Secondary | ICD-10-CM | POA: Diagnosis not present

## 2024-09-07 LAB — CUP PACEART REMOTE DEVICE CHECK
Battery Remaining Longevity: 121 mo
Battery Remaining Percentage: 95.5 %
Battery Voltage: 3.01 V
Brady Statistic AP VP Percent: 1 %
Brady Statistic AP VS Percent: 97 %
Brady Statistic AS VP Percent: 1 %
Brady Statistic AS VS Percent: 1.9 %
Brady Statistic RA Percent Paced: 97 %
Brady Statistic RV Percent Paced: 1 %
Date Time Interrogation Session: 20251013020013
Implantable Lead Connection Status: 753985
Implantable Lead Connection Status: 753985
Implantable Lead Implant Date: 20250530
Implantable Lead Implant Date: 20250530
Implantable Lead Location: 753859
Implantable Lead Location: 753860
Implantable Pulse Generator Implant Date: 20250530
Lead Channel Impedance Value: 430 Ohm
Lead Channel Impedance Value: 580 Ohm
Lead Channel Pacing Threshold Amplitude: 0.75 V
Lead Channel Pacing Threshold Amplitude: 1.125 V
Lead Channel Pacing Threshold Pulse Width: 0.5 ms
Lead Channel Pacing Threshold Pulse Width: 0.5 ms
Lead Channel Sensing Intrinsic Amplitude: 12 mV
Lead Channel Sensing Intrinsic Amplitude: 2.6 mV
Lead Channel Setting Pacing Amplitude: 1.375
Lead Channel Setting Pacing Amplitude: 1.75 V
Lead Channel Setting Pacing Pulse Width: 0.5 ms
Lead Channel Setting Sensing Sensitivity: 2 mV
Pulse Gen Model: 2272
Pulse Gen Serial Number: 8285953

## 2024-09-08 NOTE — Progress Notes (Signed)
 Remote PPM Transmission

## 2024-09-21 ENCOUNTER — Ambulatory Visit: Payer: Self-pay | Admitting: Cardiovascular Disease

## 2024-09-21 ENCOUNTER — Telehealth: Payer: Self-pay | Admitting: Internal Medicine

## 2024-09-21 NOTE — Telephone Encounter (Signed)
 Paper Work Dropped Off: Blood pressure reading  Date: 09/21/24  Location of paper:  Dr. Mona Box

## 2024-09-22 ENCOUNTER — Encounter: Payer: Self-pay | Admitting: Internal Medicine

## 2024-09-23 NOTE — Telephone Encounter (Signed)
 MD sent patient a message about BP readings on 09/22/24, which has been reviewed.  Last read by Levorn LELON Haste at 6:35PM on 09/22/2024.

## 2024-11-04 ENCOUNTER — Telehealth: Payer: Self-pay | Admitting: Internal Medicine

## 2024-11-04 DIAGNOSIS — I4819 Other persistent atrial fibrillation: Secondary | ICD-10-CM

## 2024-11-04 MED ORDER — METOPROLOL TARTRATE 25 MG PO TABS: 25.0000 mg | ORAL_TABLET | Freq: Two times a day (BID) | ORAL | 3 refills | Status: AC

## 2024-11-04 NOTE — Telephone Encounter (Signed)
°*  STAT* If patient is at the pharmacy, call can be transferred to refill team.   1. Which medications need to be refilled? (please list name of each medication and dose if known)   metoprolol  tartrate (LOPRESSOR ) 25 MG tablet   2. Would you like to learn more about the convenience, safety, & potential cost savings by using the [facility]?   3. Are you open to using the [facility] (Type [facility]. ).  4. Which pharmacy/location (including street and city if local pharmacy) is medication to be sent to?  [facility] - [place], [place] - [address]   5. Do they need a 30 day or 90 day supply?   90 day  Patient stated she will be out of this medication tomorrow.   Patient has appointment scheduled with KYM Door, PA on 4/7.

## 2024-11-04 NOTE — Telephone Encounter (Signed)
 Refills has been sent to the pharmacy.

## 2024-12-04 ENCOUNTER — Encounter: Payer: Self-pay | Admitting: *Deleted

## 2024-12-04 NOTE — Progress Notes (Signed)
 TRINITEE HORGAN                                          MRN: [id number]   12/04/2024   The [facility]BC[facility] Quality Team Specialist reviewed this patient medical record for the purposes of chart review for care gap closure. The following were reviewed: chart review for care gap closure-colorectal cancer screening and controlling blood pressure.    VBCI Quality Team

## 2024-12-07 ENCOUNTER — Ambulatory Visit

## 2024-12-07 DIAGNOSIS — I495 Sick sinus syndrome: Secondary | ICD-10-CM | POA: Diagnosis not present

## 2024-12-08 ENCOUNTER — Ambulatory Visit: Payer: Self-pay | Admitting: Cardiovascular Disease

## 2024-12-08 LAB — CUP PACEART REMOTE DEVICE CHECK
Battery Remaining Longevity: 113 mo
Battery Remaining Percentage: 95.5 %
Battery Voltage: 2.99 V
Brady Statistic AP VP Percent: 1 %
Brady Statistic AP VS Percent: 97 %
Brady Statistic AS VP Percent: 1 %
Brady Statistic AS VS Percent: 2 %
Brady Statistic RA Percent Paced: 98 %
Brady Statistic RV Percent Paced: 1 %
Date Time Interrogation Session: 20260112020014
Implantable Lead Connection Status: 753985
Implantable Lead Connection Status: 753985
Implantable Lead Implant Date: 20250530
Implantable Lead Implant Date: 20250530
Implantable Lead Location: 753859
Implantable Lead Location: 753860
Implantable Pulse Generator Implant Date: 20250530
Lead Channel Impedance Value: 350 Ohm
Lead Channel Impedance Value: 440 Ohm
Lead Channel Pacing Threshold Amplitude: 0.5 V
Lead Channel Pacing Threshold Amplitude: 1.25 V
Lead Channel Pacing Threshold Pulse Width: 0.5 ms
Lead Channel Pacing Threshold Pulse Width: 0.5 ms
Lead Channel Sensing Intrinsic Amplitude: 10.9 mV
Lead Channel Sensing Intrinsic Amplitude: 2.8 mV
Lead Channel Setting Pacing Amplitude: 1.5 V
Lead Channel Setting Pacing Amplitude: 1.5 V
Lead Channel Setting Pacing Pulse Width: 0.5 ms
Lead Channel Setting Sensing Sensitivity: 2 mV
Pulse Gen Model: 2272
Pulse Gen Serial Number: 8285953

## 2024-12-09 NOTE — Progress Notes (Signed)
 Remote PPM Transmission

## 2024-12-29 ENCOUNTER — Telehealth: Payer: Self-pay | Admitting: Cardiovascular Disease

## 2024-12-29 NOTE — Telephone Encounter (Signed)
 Spoke w/ patient - she is scheduled for first appt in March w/ [facility]/EP APP for 3/3.

## 2024-12-29 NOTE — Telephone Encounter (Signed)
 STAT if HR is under 50 or over 120  (normal HR is 60-100 beats per minute)  What is your heart rate? [phone]  Do you have a log of your heart rate readings (document readings)?    Do you have any other symptoms? Has some heart flutters

## 2024-12-29 NOTE — Telephone Encounter (Signed)
 Pt calling with c/o atrial fibrillation.  Yesterday morning got in recliner and HR started going up 99-110 (Per pulse oximeter) 1130 Lopressor  x1-25 mg 1530 Lopressor  x1-25 mg Regular medications taken last evening.  Pt HR got to 127 this am. Pt stating medications taken and tried to rest, but it hasn't helped heart rate.  Pt states she is asymptomatic otherwise. Denies SOB.  Pt given ED precautions if symptoms develop.  Will send to EP triage for further recommendations.  Pt verbalizes understanding of plan and awaiting return call.

## 2025-01-26 ENCOUNTER — Ambulatory Visit: Admitting: Cardiology

## 2025-03-02 ENCOUNTER — Ambulatory Visit: Admitting: Student

## 2025-03-08 ENCOUNTER — Encounter

## 2025-06-07 ENCOUNTER — Encounter

## 2025-09-06 ENCOUNTER — Encounter

## 2025-12-06 ENCOUNTER — Encounter

## 2026-03-07 ENCOUNTER — Encounter
